# Patient Record
Sex: Female | Born: 1945
Health system: Southern US, Community
[De-identification: ages and names within clinical notes are randomized; demographics above are authoritative.]

## PROBLEM LIST (undated history)

## (undated) DIAGNOSIS — M4310 Spondylolisthesis, site unspecified: Secondary | ICD-10-CM

## (undated) DIAGNOSIS — T7840XA Allergy, unspecified, initial encounter: Secondary | ICD-10-CM

## (undated) DIAGNOSIS — Z8719 Personal history of other diseases of the digestive system: Secondary | ICD-10-CM

## (undated) DIAGNOSIS — G4730 Sleep apnea, unspecified: Secondary | ICD-10-CM

## (undated) DIAGNOSIS — R112 Nausea with vomiting, unspecified: Secondary | ICD-10-CM

## (undated) DIAGNOSIS — T4145XA Adverse effect of unspecified anesthetic, initial encounter: Secondary | ICD-10-CM

## (undated) DIAGNOSIS — Z889 Allergy status to unspecified drugs, medicaments and biological substances status: Secondary | ICD-10-CM

## (undated) DIAGNOSIS — I82409 Acute embolism and thrombosis of unspecified deep veins of unspecified lower extremity: Secondary | ICD-10-CM

## (undated) DIAGNOSIS — T8859XA Other complications of anesthesia, initial encounter: Secondary | ICD-10-CM

## (undated) DIAGNOSIS — M1990 Unspecified osteoarthritis, unspecified site: Secondary | ICD-10-CM

## (undated) DIAGNOSIS — K219 Gastro-esophageal reflux disease without esophagitis: Secondary | ICD-10-CM

## (undated) DIAGNOSIS — Z9889 Other specified postprocedural states: Secondary | ICD-10-CM

## (undated) DIAGNOSIS — E7800 Pure hypercholesterolemia, unspecified: Secondary | ICD-10-CM

## (undated) DIAGNOSIS — I2699 Other pulmonary embolism without acute cor pulmonale: Secondary | ICD-10-CM

## (undated) HISTORY — PX: HIATAL HERNIA REPAIR: SHX195

## (undated) HISTORY — PX: SPINE SURGERY: SHX786

## (undated) HISTORY — PX: BACK SURGERY: SHX140

## (undated) HISTORY — PX: TONSILLECTOMY: SUR1361

## (undated) HISTORY — DX: Allergy, unspecified, initial encounter: T78.40XA

## (undated) HISTORY — DX: Spondylolisthesis, site unspecified: M43.10

## (undated) HISTORY — DX: Acute embolism and thrombosis of unspecified deep veins of unspecified lower extremity: I82.409

---

## 1975-03-26 HISTORY — PX: TUBAL LIGATION: SHX77

## 1976-03-25 HISTORY — PX: HEMORRHOID SURGERY: SHX153

## 1998-03-25 HISTORY — PX: CHOLECYSTECTOMY: SHX55

## 2000-10-20 ENCOUNTER — Other Ambulatory Visit: Admission: RE | Admit: 2000-10-20 | Discharge: 2000-10-20 | Payer: Self-pay | Admitting: Obstetrics and Gynecology

## 2001-03-25 HISTORY — PX: CARPAL TUNNEL RELEASE: SHX101

## 2003-03-26 HISTORY — PX: EYE SURGERY: SHX253

## 2004-12-24 ENCOUNTER — Ambulatory Visit: Payer: Self-pay | Admitting: Gastroenterology

## 2005-01-25 ENCOUNTER — Ambulatory Visit: Payer: Self-pay | Admitting: Gastroenterology

## 2007-01-05 ENCOUNTER — Ambulatory Visit: Payer: Self-pay | Admitting: Gastroenterology

## 2007-03-26 HISTORY — PX: BLADDER SUSPENSION: SHX72

## 2009-03-25 DIAGNOSIS — I82409 Acute embolism and thrombosis of unspecified deep veins of unspecified lower extremity: Secondary | ICD-10-CM

## 2009-03-25 HISTORY — DX: Acute embolism and thrombosis of unspecified deep veins of unspecified lower extremity: I82.409

## 2010-01-23 ENCOUNTER — Ambulatory Visit: Payer: Self-pay | Admitting: Internal Medicine

## 2010-02-01 ENCOUNTER — Ambulatory Visit: Payer: Self-pay | Admitting: Internal Medicine

## 2010-02-01 ENCOUNTER — Inpatient Hospital Stay: Payer: Self-pay | Admitting: Internal Medicine

## 2010-02-01 ENCOUNTER — Ambulatory Visit: Payer: Self-pay | Admitting: Sports Medicine

## 2010-02-01 HISTORY — PX: OTHER SURGICAL HISTORY: SHX169

## 2010-02-08 ENCOUNTER — Ambulatory Visit: Payer: Self-pay | Admitting: Internal Medicine

## 2010-02-10 LAB — CEA: CEA: 0.4 ng/mL (ref 0.0–4.7)

## 2010-02-10 LAB — CA 125: CA 125: 8.5 U/mL (ref 0.0–34.0)

## 2010-02-22 ENCOUNTER — Ambulatory Visit: Payer: Self-pay | Admitting: Internal Medicine

## 2010-03-25 ENCOUNTER — Ambulatory Visit: Payer: Self-pay | Admitting: Internal Medicine

## 2010-04-13 ENCOUNTER — Ambulatory Visit: Payer: Self-pay | Admitting: Gastroenterology

## 2010-04-25 ENCOUNTER — Ambulatory Visit: Payer: Self-pay | Admitting: Internal Medicine

## 2010-06-11 ENCOUNTER — Ambulatory Visit: Payer: Self-pay | Admitting: Internal Medicine

## 2010-06-12 LAB — CANCER ANTIGEN 19-9: CA 19-9: 13 U/mL (ref 0–35)

## 2010-06-24 ENCOUNTER — Ambulatory Visit: Payer: Self-pay | Admitting: Internal Medicine

## 2010-08-08 ENCOUNTER — Ambulatory Visit (HOSPITAL_COMMUNITY)
Admission: RE | Admit: 2010-08-08 | Discharge: 2010-08-08 | Disposition: A | Discharge status: Home / Self Care | Payer: PRIVATE HEALTH INSURANCE | Source: Ambulatory Visit | Attending: Orthopedic Surgery | Admitting: Orthopedic Surgery

## 2010-08-08 ENCOUNTER — Encounter (HOSPITAL_COMMUNITY)
Admission: RE | Admit: 2010-08-08 | Discharge: 2010-08-08 | Disposition: A | Discharge status: Home / Self Care | Payer: PRIVATE HEALTH INSURANCE | Source: Ambulatory Visit | Attending: Orthopedic Surgery | Admitting: Orthopedic Surgery

## 2010-08-08 ENCOUNTER — Other Ambulatory Visit (HOSPITAL_COMMUNITY): Payer: Self-pay | Admitting: Orthopedic Surgery

## 2010-08-08 DIAGNOSIS — M51379 Other intervertebral disc degeneration, lumbosacral region without mention of lumbar back pain or lower extremity pain: Secondary | ICD-10-CM | POA: Insufficient documentation

## 2010-08-08 DIAGNOSIS — M4120 Other idiopathic scoliosis, site unspecified: Secondary | ICD-10-CM | POA: Insufficient documentation

## 2010-08-08 DIAGNOSIS — M47816 Spondylosis without myelopathy or radiculopathy, lumbar region: Secondary | ICD-10-CM

## 2010-08-08 DIAGNOSIS — M5137 Other intervertebral disc degeneration, lumbosacral region: Secondary | ICD-10-CM | POA: Insufficient documentation

## 2010-08-08 DIAGNOSIS — Z01818 Encounter for other preprocedural examination: Secondary | ICD-10-CM | POA: Insufficient documentation

## 2010-08-08 DIAGNOSIS — Z01812 Encounter for preprocedural laboratory examination: Secondary | ICD-10-CM | POA: Insufficient documentation

## 2010-08-08 DIAGNOSIS — I517 Cardiomegaly: Secondary | ICD-10-CM | POA: Insufficient documentation

## 2010-08-08 DIAGNOSIS — M79609 Pain in unspecified limb: Secondary | ICD-10-CM | POA: Insufficient documentation

## 2010-08-08 LAB — CBC
HCT: 41.2 % (ref 36.0–46.0)
Hemoglobin: 13.9 g/dL (ref 12.0–15.0)
MCH: 31.2 pg (ref 26.0–34.0)
MCHC: 33.7 g/dL (ref 30.0–36.0)
MCV: 92.4 fL (ref 78.0–100.0)
Platelets: 189 10*3/uL (ref 150–400)
RBC: 4.46 MIL/uL (ref 3.87–5.11)
RDW: 13.3 % (ref 11.5–15.5)
WBC: 7.5 10*3/uL (ref 4.0–10.5)

## 2010-08-08 LAB — BASIC METABOLIC PANEL
BUN: 15 mg/dL (ref 6–23)
CO2: 30 mEq/L (ref 19–32)
Calcium: 10.3 mg/dL (ref 8.4–10.5)
Chloride: 102 mEq/L (ref 96–112)
Creatinine, Ser: 0.85 mg/dL (ref 0.4–1.2)
GFR calc Af Amer: >60 mL/min (ref >60)
GFR calc non Af Amer: >60 mL/min (ref >60)
Glucose, Bld: 94 mg/dL (ref 70–99)
Potassium: 4.2 mEq/L (ref 3.5–5.1)
Sodium: 140 mEq/L (ref 135–145)

## 2010-08-08 LAB — SURGICAL PCR SCREEN
MRSA, PCR: NEGATIVE
Staphylococcus aureus: NEGATIVE

## 2010-08-08 LAB — PROTIME-INR
INR: 2.18 — ABNORMAL HIGH (ref 0.00–1.49)
Prothrombin Time: 24.4 seconds — ABNORMAL HIGH (ref 11.6–15.2)

## 2010-08-08 LAB — APTT: aPTT: 40 seconds — ABNORMAL HIGH (ref 24–37)

## 2010-08-15 ENCOUNTER — Inpatient Hospital Stay (HOSPITAL_COMMUNITY)
Admission: RE | Admit: 2010-08-15 | Discharge: 2010-08-18 | DRG: 460 | Disposition: A | Discharge status: Home Health | Payer: PRIVATE HEALTH INSURANCE | Source: Ambulatory Visit | Attending: Orthopedic Surgery | Admitting: Orthopedic Surgery

## 2010-08-15 ENCOUNTER — Inpatient Hospital Stay (HOSPITAL_COMMUNITY): Payer: PRIVATE HEALTH INSURANCE

## 2010-08-15 DIAGNOSIS — K219 Gastro-esophageal reflux disease without esophagitis: Secondary | ICD-10-CM | POA: Diagnosis present

## 2010-08-15 DIAGNOSIS — Z86718 Personal history of other venous thrombosis and embolism: Secondary | ICD-10-CM

## 2010-08-15 DIAGNOSIS — M5126 Other intervertebral disc displacement, lumbar region: Principal | ICD-10-CM | POA: Diagnosis present

## 2010-08-15 DIAGNOSIS — Z86711 Personal history of pulmonary embolism: Secondary | ICD-10-CM

## 2010-08-15 LAB — APTT: aPTT: 28 seconds (ref 24–37)

## 2010-08-15 LAB — TYPE AND SCREEN
ABO/RH(D): O POS
Antibody Screen: NEGATIVE

## 2010-08-15 LAB — ABO/RH: ABO/RH(D): O POS

## 2010-08-15 LAB — PROTIME-INR
INR: 1.07 (ref 0.00–1.49)
Prothrombin Time: 14.1 seconds (ref 11.6–15.2)

## 2010-08-16 ENCOUNTER — Inpatient Hospital Stay (HOSPITAL_COMMUNITY): Payer: PRIVATE HEALTH INSURANCE

## 2010-08-17 LAB — PROTIME-INR
INR: 1.09 (ref 0.00–1.49)
Prothrombin Time: 14.3 seconds (ref 11.6–15.2)

## 2010-08-18 LAB — PROTIME-INR
INR: 1.17 (ref 0.00–1.49)
Prothrombin Time: 15.1 seconds (ref 11.6–15.2)

## 2010-09-03 NOTE — Op Note (Signed)
NAME:  Elizabeth Crawford, Elizabeth Crawford              ACCOUNT NO.:  [id number]  MEDICAL RECORD NO.:  [id number]           PATIENT TYPE:  I  LOCATION:  5013                         FACILITY:  [facility]  PHYSICIAN:  Alvy Beal, MD    DATE OF BIRTH:  12/24/1945  DATE OF PROCEDURE: DATE OF DISCHARGE:                              OPERATIVE REPORT   PREOPERATIVE DIAGNOSIS:  Degenerative slip L4-5.  POSTOPERATIVE DIAGNOSIS:  Degenerative slip L4-5.  OPERATIVE PROCEDURE:  Initially planned for transforaminal lumbar interbody fusion L4-5.  However, after discussion in the holding area prior to the patient receiving any medication, I elected to change to an extreme lateral interbody fusion with posterior pedicle screw fixation. I did indicate to the patient that I thought the procedure to be done in shorter amount of time with less blood loss and have a greater chance of improved reduction and thereby indirect decompression of the nerve. Especially since she was having now bilateral leg pain  I wanted to ensure that I had a chance to do an indirect decompression of both exiting nerve roots.  I did re-discuss the risks of the XLIF which include infection, bleeding, nerve damage, death, stroke, paralysis, injury to the bowel, bleeding, blood clots, CSF leak, adjacent segment disease and nonunion.  After discussing this with her, she elected to proceed as I recommended with the XLIF with posterior pedicle screw fixation.  OPERATIVE PROCEDURE:  Extreme anterolateral interbody fusion L4-5 with a NuVasive PEEK interbody spacer, 18 x 10 lordotic spacer packed with Osteocel with unilateral pedicle screw fixation and posterior lateral arthrodesis.  COMPLICATIONS:  None.  BLOOD LOSS:  100 mL.  HISTORY:  This is a very pleasant 65 year old woman who was having severe debilitating back, buttock and initially left leg pain but now increasing right leg pain.  Attempts at conservative management had failed to  alleviate her symptoms and so we discussed surgical intervention.  After failing conservative management, we elected to proceed with surgery.  All appropriate risks, benefits, and alternatives were discussed.  OPERATIVE NOTE:  The patient was brought to the operating room, placed supine on the operating room table.  After successful induction of general anesthesia, endotracheal intubation, TEDs, SCDs and Foley were inserted.  Evoked motor potential needles were inserted into the lower extremities in the standard fashion.  The patient was then turned into the left lateral decubitus position left side up.  Axillary roll was placed and all bony prominences were well padded.  The patient was then secured to the table with tape.  We then brought the x-ray machine in, flexed the bed so that I had good visualization of the L4-5 disk space. Once this was done, I prepped and draped the flanks and back. Appropriate time-out was done to confirm patient procedure, affected extremity, and all other pertinent important data.  Once this was done, I made an incision approximately 1 fingerbreadth from the iliac wing, dissected down to the fascia of the external oblique.  The external oblique fascia was sharply incised.  This incision was approximately 2 inches in length.  I then made a second incision on the posterior lateral  aspect of the back and then dissected bluntly until I was at the fascia of the retroperitoneal space.  I popped through this fascia and then I was able to palpate the ventral surface of the retroperitoneal fascia.  I then swept into the retroperitoneal fascia with a finger and then swept my finger up to the external oblique.  I then bluntly dissected through the external oblique until my finger was now in the first incision.  I advanced trocar over my finger down to the iliopsoas. I then took a picture to make sure I was at the level of the disk space, connected the probe to the  neural stimulator and then advanced the probe with all stimulating through the iliopsoas and into the disk.  I then circumferentially tested to ensure that lumbar plexus was posterior to my trocar.  Once I confirmed this, I then sequentially dilated over this and then placed the guide pin down to hold the position.  I again confirmed satisfactory position in AP and lateral planes.  Once this was done, I placed a self-retaining retractor down, stimulated the posterior blade and again confirmed that the lumbar plexus was posterior to my blade.  I then placed a shim, locked into the disk space.  I then removed the trocars and the stabilizing pin and I could clearly visualize the lateral aspect of the L4-5 disk space.  I then properly positioned with retractors, placed with light and then incised the disk space with a 10 blade scalpel.  Using a combination of pituitary rongeurs, curettes and Kerrison rongeurs, I removed all of the disk material at the L4-5 level.  I had an excellent complete diskectomy, rasped the endplates until I had bleeding bone.  I then placed my angled Cobb elevator on the contralateral annulus and broke to the annulus at both the L5 and the L4 endplates.  At this point with the diskectomy complete and bleeding subchondral bone exposed, I then sequentially trialed and elected to put the 18 x 10 lordotic cage in place.  This was malleted into place and I had excellent reduction and placement and fixation with the hardware.  Once I had the cage in place, I then removed the retractors and made sure I had hemostasis in the psoas.  At this point the AP and lateral x-rays showed that I had satisfactory position of the hardware and at the grade 1 borderline grade 2 slip was completely reduced.  At this point I was very pleased with the reduction and stability of my graft and so I elected only to use unilateral pedicle screw fixation as this was all that I thought was  necessary.  At this point I identified the  lateral border of the left L4 and L5 pedicle and made an incision connecting.  I then dissected down to the deep fascia and then with my finger I was able to palpate the 3-4 and 4- 5 facet complexes as well as the L4 and L5 transverse processes.  Once I was able to palpate this with a finger, I placed my Jamshidi needle onto the transverse process of L4 and manually walked up to the lateral aspect of the facet.  I confirmed this positioning with x-ray and then advanced the trocar using x-ray as well as neuro monitoring through the pedicle.  I confirmed satisfactory position in both planes and then advanced the trocar into the vertebral body.  I repeated this at L5.  I then tapped over the guide pin and  then placed 40-mm Synthes Matrix MIS screws at the L4 and L5.  I had excellent purchase with the screws.  I then used a high-speed bur to decorticate the transverse process of L4 and L5 as well as the lateral aspect of the L4-5 facet complex.  I then placed a 40-mm rod and secured it down to the L4 and L5 screws and torqued them appropriately.  There was adequate amount of rod on both sides.  I then placed bone graft into the posterior lateral gutter, irrigated, closed in a layered fashion with interrupted 0 Vicryl suture, 2-0 Vicryl suture and 3-0 Monocryl.  I then irrigated the other 2 incisions, closed the fascia of the external oblique with interrupted #1 Vicryl sutures and superficial with 2-0 Vicryl sutures and 3-0 Monocryl for the skin.  The other incision was closed in a layered fashion with 0 Vicryl, 2-0 Vicryl, and 3-0 Monocryl.  At the end of the case, all needle and sponge counts were correct.  There was no adverse intraoperative events.  The patient was extubated, transferred to the PACU without incident.     Alvy Beal, MD     DDB/MEDQ  D:  08/15/2010  T:  08/16/2010  Job:  [id number]  Electronically Signed by Venita Lick MD on 09/03/2010 12:01:23 AM

## 2010-10-25 NOTE — Discharge Summary (Signed)
NAME:  Elizabeth Crawford, Elizabeth Crawford NO.:  [id number]  MEDICAL RECORD NO.:  [id number]  LOCATION:  5013                         FACILITY:  [facility]  PHYSICIAN:  Alvy Beal, MD    DATE OF BIRTH:  Mar 14, 1946  DATE OF ADMISSION:  08/15/2010 DATE OF DISCHARGE:  08/18/2010                              DISCHARGE SUMMARY   ADMITTING DIAGNOSIS:  Grade 1 borderline 2 spondylolisthesis at L4-5 with severe bilateral lower extremity pain.  DISCHARGE DIAGNOSES:  Status post XLIF L4-5, posterior spinal fusion instrumentation L4-5.  SURGEON:  Alvy Beal, MD  PROCEDURE:  Extreme anterolateral interbody fusion, L4-5 with an invasive PEEK interbody spacer, 18 x 10 lordotic spacer packed with a Osteocel with unilateral pedicle screw fixation and posterolateral arthrodesis.  COMPLICATIONS:  None.  ANESTHESIA:  General.  CONSULTS:  None.  BRIEF HISTORY:  Mackenzi is a 65 year old retired woman who had beenhaving significant progressive back and now increasing bilateral leg pain since September 2011.  There was no known injury, trauma, or specific event.  She has tried and unfortunately failed injection therapy, physical therapy, activity modification, and oral medications. Because of the continued progression of her symptoms, she elected to proceed with surgery.  MRI from July 02, 2010, demonstrates a grade 1 anterolisthesis of L4 on L5 related to severe facet arthrosis.  Severe central canal and lateral recess stenosis.  Moderate foraminal stenosis.  HOSPITAL COURSE:  On Aug 15, 2010, the patient was admitted to [facility], underwent the above procedure without complications.  The patient's condition following surgery was stable.  She was transferred to the PACU and subsequently to the orthopedic floor in good condition.  On postop day #1, the patient was doing well.  There was no complaints of leg pain.  Vital signs were stable.  She was afebrile.  There was  no shortness of breath or chest pain.  The abdomen was soft and nontender. Neurovascular status was intact distally.  On postop day #2, the patient had progressed on schedule without difficulty with inpatient PT and OT.  Her clinical exam was unchanged. She was tolerating her diet.  She is moving her bowels without difficulty.  She is tolerating her oral pain medication.  X-rays of the lumbar spine considered satisfactory.  PLAN:  On postop day #2, was to discharge to home, Saturday.  On postop day #3, the patient will be discharged to home with home health assistance.  She will follow up in our office in 2 weeks for wound check and evaluation.  DISCHARGE INSTRUCTIONS:  Given to the patient at time of discharge.  She understands.  DISCHARGE MEDICATIONS: 1. Calcium carbonate/vitamin D over the counter 1 p.o. daily. 2. Loratadine 10 mg 1 p.o. daily. 3. Omeprazole 40 mg 1 p.o. daily  At this point, we will discontinue the tramadol 50 mg and the warfarin 4 mg.  All the patient's questions were encouraged, addressed, and answered. At the time of discharge, the patient was stable and in good condition. She will follow up with [place] in the office in 2 weeks.    ______________________________ Norval Gable, PA   ______________________________ Alvy Beal, MD    DK/MEDQ  D:  10/18/2010  T:  10/19/2010  Job:  [id number]  Electronically Signed by Norval Gable PA on 10/22/2010 08:27:23 AM Electronically Signed by Venita Lick MD on 10/25/2010 11:27:57 AM

## 2010-11-27 ENCOUNTER — Ambulatory Visit: Payer: Self-pay | Admitting: Vascular Surgery

## 2011-02-18 ENCOUNTER — Encounter: Payer: Self-pay | Admitting: Vascular Surgery

## 2011-02-25 ENCOUNTER — Encounter: Payer: Self-pay | Admitting: Vascular Surgery

## 2011-02-26 ENCOUNTER — Ambulatory Visit (INDEPENDENT_AMBULATORY_CARE_PROVIDER_SITE_OTHER): Payer: PRIVATE HEALTH INSURANCE | Admitting: Vascular Surgery

## 2011-02-26 ENCOUNTER — Encounter: Payer: Self-pay | Admitting: Vascular Surgery

## 2011-02-26 VITALS — BP 124/82 | HR 84 | Resp 16 | Ht 65.0 in | Wt 195.0 lb

## 2011-02-26 DIAGNOSIS — Z86718 Personal history of other venous thrombosis and embolism: Secondary | ICD-10-CM

## 2011-02-26 DIAGNOSIS — Z86711 Personal history of pulmonary embolism: Secondary | ICD-10-CM

## 2011-02-26 NOTE — Progress Notes (Signed)
The patient presents today for second opinion regarding her inferior vena caval filter. She is a very pleasant 65 year old female who had an episode of DVT and pulmonary embolus in November of 2011. She had an extensive workup and no etiology for her thrombus. She did not have any evidence of hypercoagulability. She does report that she was on hormone replacement at the time of this and stopped this. She is been maintained on Coumadin therapy since the event. There was decision to place a vena cava filter at the time of her initial DVT and pulmonary embolus. She recently underwent back surgery with some question of angulation of her filter. She was seen by her vascular surgeon who suggested attempted retrieval of this. This was undertaken in September of this year. I have her vena caval venogram for review and also reviewed the films from her spine surgery. This does show angulation of the filter and the venogram shows that the stress are out of the cava towards the midline projecting to the left. His had no subsequent thrombus and does not have any history of lower surety arterial insufficiency.   Past Medical History  Diagnosis Date  . Spondylolisthesis   . Allergy   . DVT (deep venous thrombosis)     History  Substance Use Topics  . Smoking status: Former Smoker    Types: Cigarettes    Quit date: 02/26/1976  . Smokeless tobacco: Not on file  . Alcohol Use: No     occasionally    Family History  Problem Relation Age of Onset  . COPD Mother   . Heart disease Mother   . Aortic aneurysm Mother   . Heart disease Father     Allergies  Allergen Reactions  . Sulfa Antibiotics     Current outpatient prescriptions:DiphenhydrAMINE HCl (ALLERGY MEDICATION PO), Take by mouth.  , Disp: , Rfl: ;  Omeprazole POWD, by Does not apply route.  , Disp: , Rfl: ;  Warfarin Sodium (COUMADIN PO), Take by mouth.  , Disp: , Rfl: ;  cetirizine (ZYRTEC) 10 MG tablet, Take 10 mg by mouth daily.  , Disp: , Rfl:  ;  traMADol (ULTRAM) 50 MG tablet, Take 50 mg by mouth every 8 (eight) hours as needed. Maximum dose= 8 tablets per day , Disp: , Rfl:   BP 124/82  Pulse 84  Resp 16  Ht 5\' 5"  (1.651 m)  Wt 195 lb (88.451 kg)  BMI 32.45 kg/m2  SpO2 99%  Body mass index is 32.45 kg/(m^2).       Review of systems: Some swelling in her legs and shortness of breath with exertion, occasional rashes, otherwise negative.  Physical exam: Well-developed white female no acute distress. HEENT normal. Carotid arteries a bruits bilaterally. Radial femoral and dorsalis pedis pulses 2+ bilaterally mild bilateral swelling of her knees distally. Abdomen soft nontender no masses noted. Heart regular rate and rhythm. Neurologically grossly intact. Skin without ulcers or rashes.  Impression and plan: Pulmonary embolus following deep venous thrombosis in November of 2012 with subsequent vena cava filter placement. All of films show penetration of the medial left sided struts of the filter outside the cava. A very long discussion with the patient and her husband present. Be quite difficult to retrieve this since it didn't in place for nearly one year even if it had not penetrated the cava. I explained to be quite unlike her to have symptoms related to this. Did explain potential bowel and aortic penetration. I explained that she  did have aortic penetration this would probably be asymptomatic and may have embolus distally. Certainly would not recommend operative removal of her caval filter.  Regarding her anticoagulation, she does not have any indication for long-term anticoagulation I can find other than possibly the existence of a vena cava filter. I explained my opinion that this would not be indication for long-term anticoagulation would feel comfortable stopping her Coumadin and beginning daily aspirin therapy. She will discuss this further with her medical doctor. We will see her again on an as-needed basis.

## 2011-07-02 ENCOUNTER — Ambulatory Visit: Payer: Self-pay | Admitting: Family Medicine

## 2012-03-25 HISTORY — PX: COLONOSCOPY W/ POLYPECTOMY: SHX1380

## 2012-05-16 IMAGING — US US EXTREM LOW VENOUS*L*
1 series · 17 of 24 positions shown · non-contrast
Comparison: none

REASON FOR EXAM: CR [id number] Eval DVT pain swelling hist of DVT
COMMENTS:

[Series 1: us extrem low venous*left* · 17 of 31 slices shown]
[im 1/31]
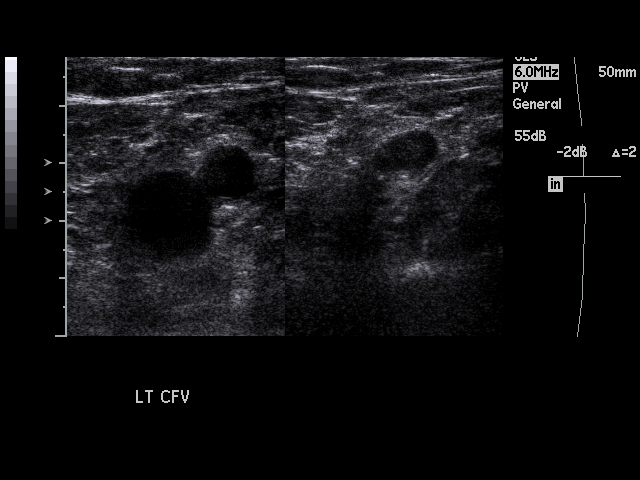
[im 3/31]
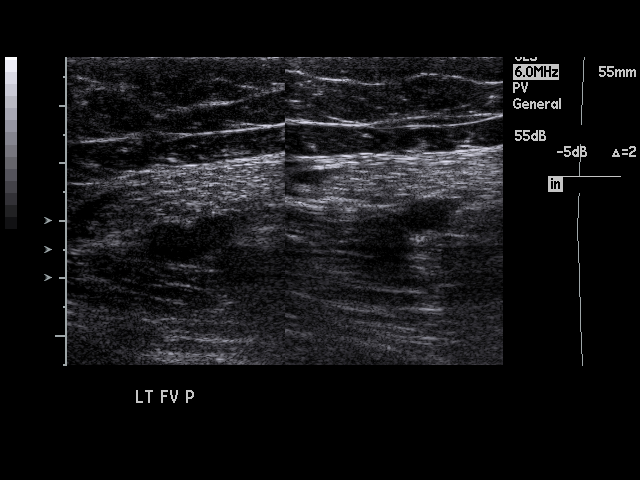
[im 4/31]
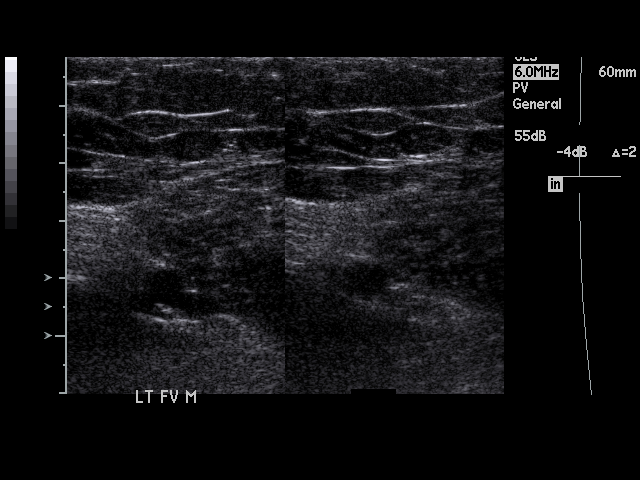
[im 6/31]
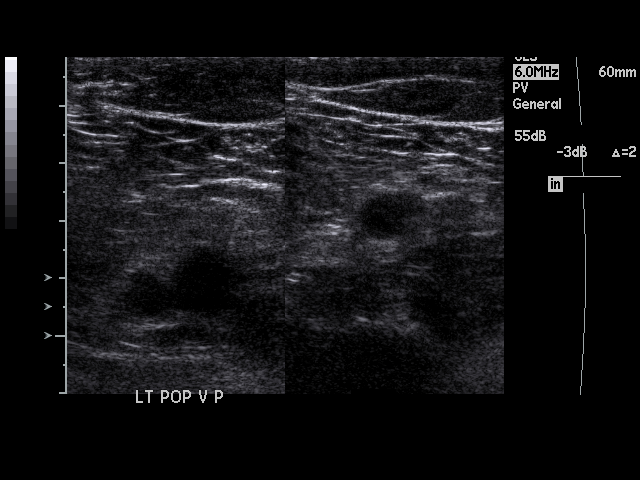
[im 8/31]
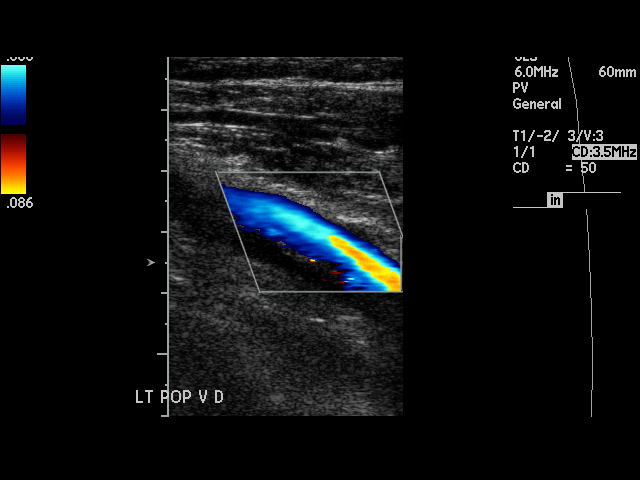
[im 10/31]
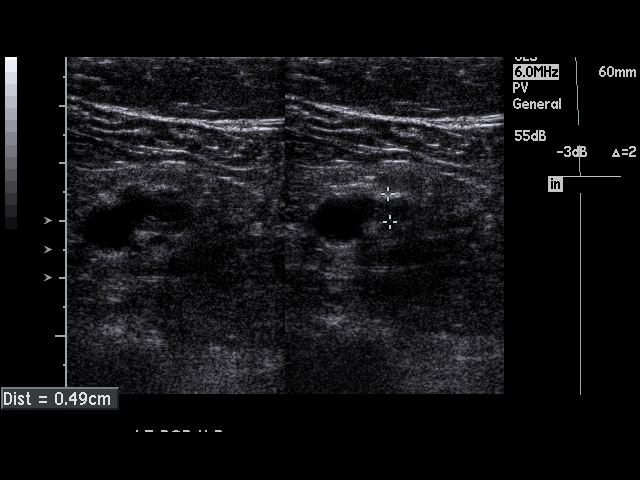
[im 12/31]
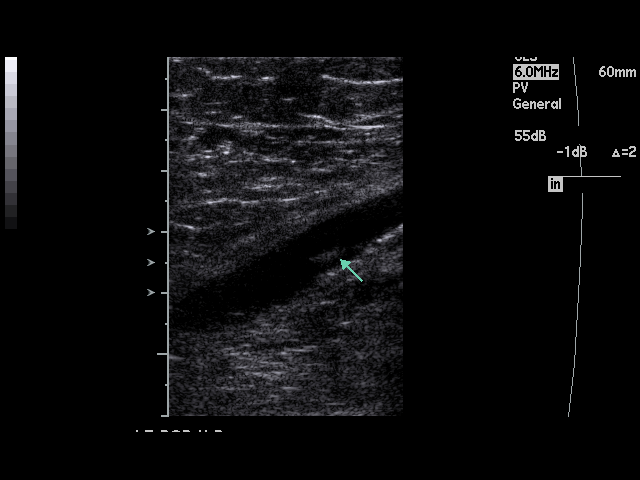
[im 14/31]
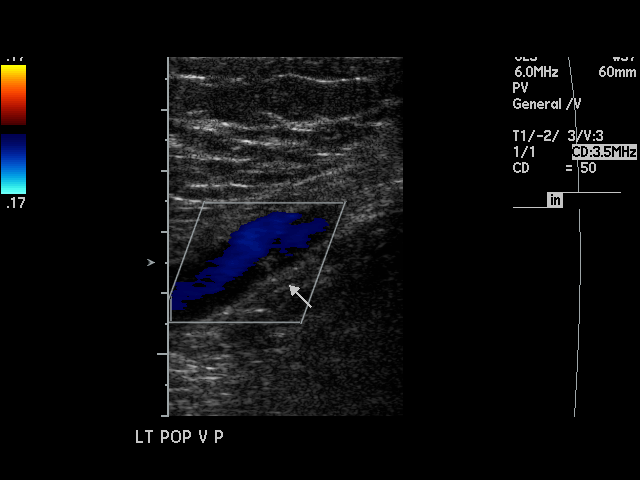
[im 16/31]
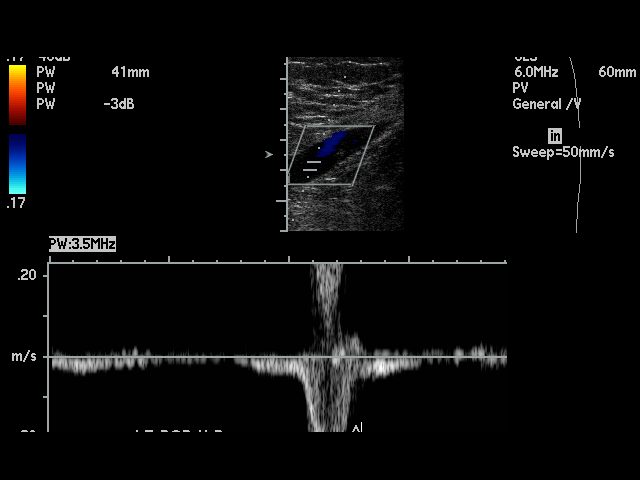
[im 17/31]
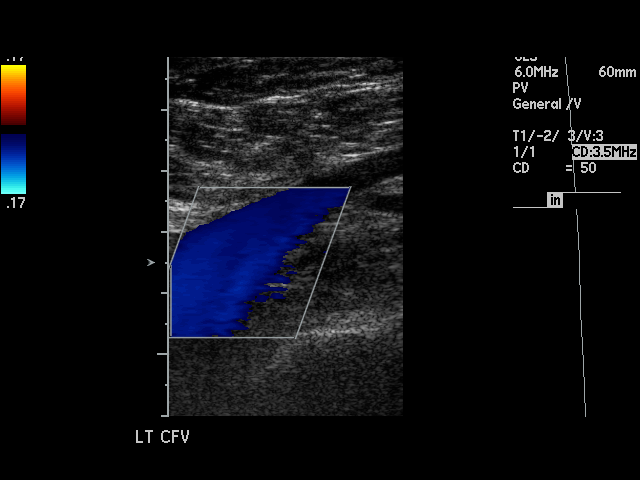
[im 19/31]
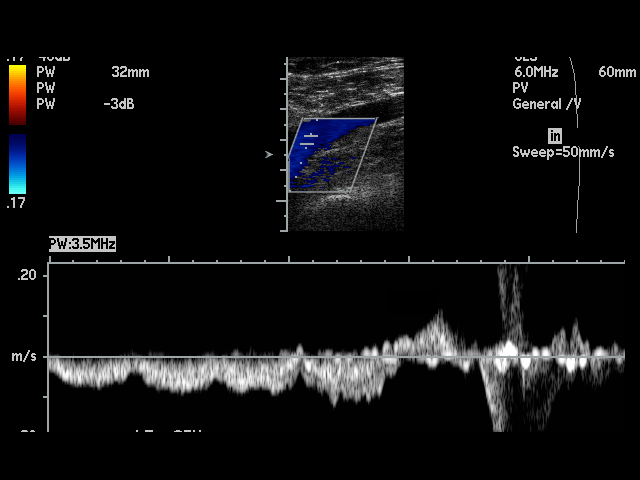
[im 21/31]
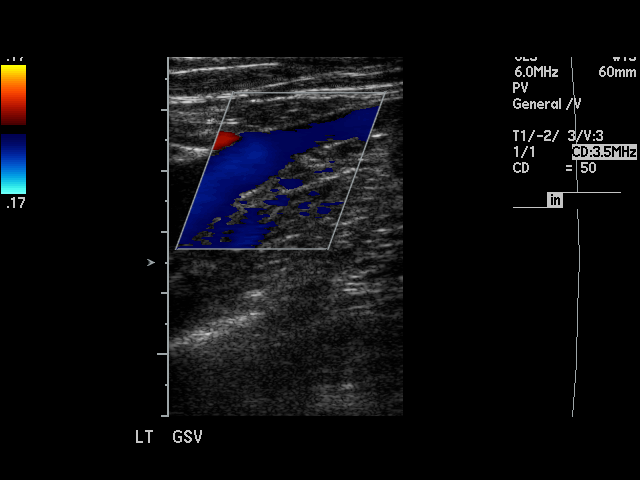
[im 23/31]
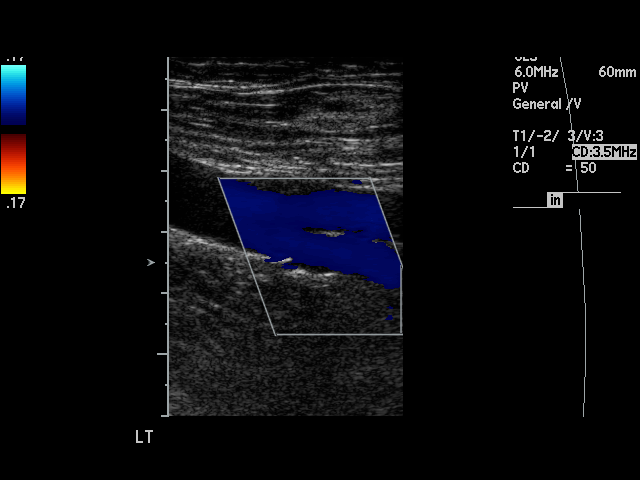
[im 25/31]
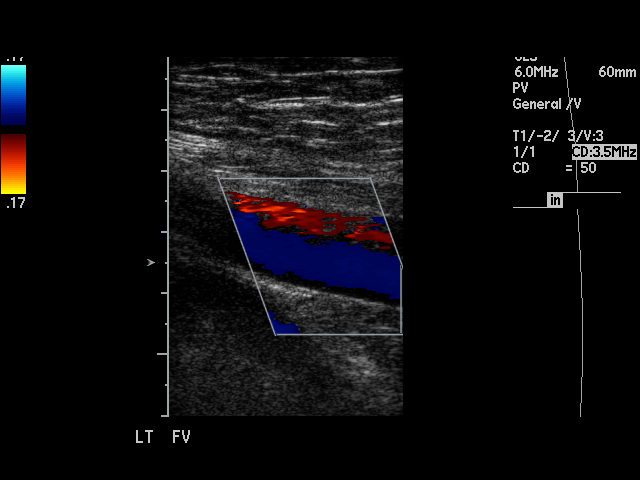
[im 27/31]
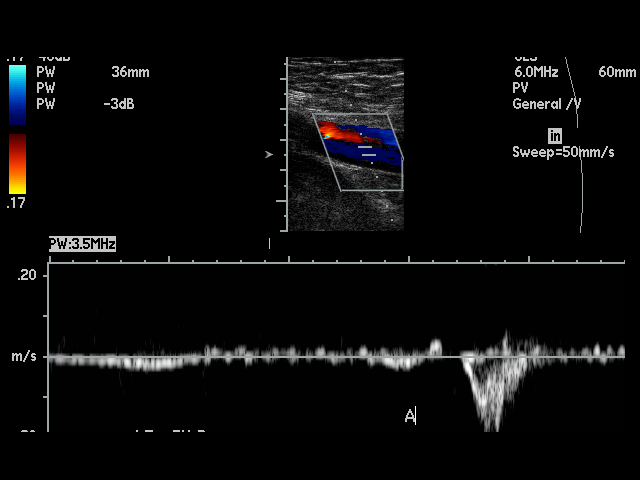
[im 28/31]
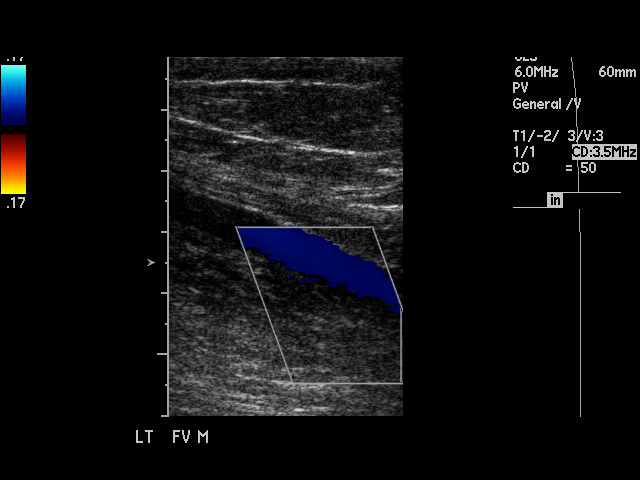
[im 31/31]
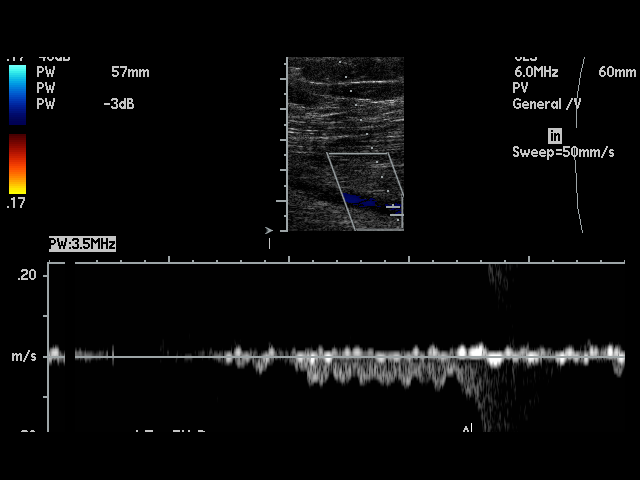

[17 of 24 positions shown; findings below may reference images not displayed]

PROCEDURE:     US  - US DOPPLER LOW EXTR LEFT  - July 02, 2011  [DATE]

RESULT:     The phasic, augmentation and Valsalva flow waveforms are normal
in appearance. There is observed normal compressibility of the common
femoral and superficial femoral. There is partial loss of compressibility of
the distal portion of the popliteal where there is observed a small
nonocclusive thrombus. Note is made this is the region of a prior deep vein
thrombosis of the popliteal that was observed on the exam of February 02, 2010. The finding observed at this time is minimal and could be chronic, but
the possibility of a minimal recurrent acute thrombus involving the
popliteal cannot be excluded on the basis of this exam.
IMPRESSION: 1.     There is a focal nonocclusive thrombus involving the left popliteal.
It is indeterminate as to whether the finding is chronic or acute but could
be acute.

## 2012-05-20 ENCOUNTER — Ambulatory Visit: Payer: Self-pay | Admitting: Gastroenterology

## 2012-05-22 LAB — PATHOLOGY REPORT

## 2013-04-30 ENCOUNTER — Emergency Department: Payer: Self-pay | Admitting: Emergency Medicine

## 2014-05-27 DIAGNOSIS — R32 Unspecified urinary incontinence: Secondary | ICD-10-CM | POA: Insufficient documentation

## 2015-04-21 ENCOUNTER — Ambulatory Visit: Admission: RE | Admit: 2015-04-21 | Payer: Medicare Other | Source: Ambulatory Visit | Admitting: Gastroenterology

## 2015-04-21 ENCOUNTER — Encounter: Admission: RE | Payer: Self-pay | Source: Ambulatory Visit

## 2015-04-21 SURGERY — COLONOSCOPY
Anesthesia: General

## 2015-10-13 ENCOUNTER — Other Ambulatory Visit: Payer: Self-pay | Admitting: Orthopedic Surgery

## 2015-10-13 DIAGNOSIS — M5441 Lumbago with sciatica, right side: Secondary | ICD-10-CM

## 2015-10-17 ENCOUNTER — Ambulatory Visit
Admission: RE | Admit: 2015-10-17 | Discharge: 2015-10-17 | Disposition: A | Discharge status: Home / Self Care | Payer: Medicare Other | Source: Ambulatory Visit | Attending: Orthopedic Surgery | Admitting: Orthopedic Surgery

## 2015-10-17 DIAGNOSIS — M5441 Lumbago with sciatica, right side: Secondary | ICD-10-CM

## 2015-11-02 ENCOUNTER — Ambulatory Visit: Payer: Self-pay | Admitting: Physician Assistant

## 2015-11-07 ENCOUNTER — Encounter (HOSPITAL_COMMUNITY): Payer: Self-pay

## 2015-11-07 ENCOUNTER — Encounter (HOSPITAL_COMMUNITY)
Admission: RE | Admit: 2015-11-07 | Discharge: 2015-11-07 | Disposition: A | Discharge status: Home / Self Care | Payer: Medicare Other | Source: Ambulatory Visit | Attending: Orthopedic Surgery | Admitting: Orthopedic Surgery

## 2015-11-07 HISTORY — DX: Allergy status to unspecified drugs, medicaments and biological substances: Z88.9

## 2015-11-07 HISTORY — DX: Unspecified osteoarthritis, unspecified site: M19.90

## 2015-11-07 HISTORY — DX: Other specified postprocedural states: Z98.890

## 2015-11-07 HISTORY — DX: Adverse effect of unspecified anesthetic, initial encounter: T41.45XA

## 2015-11-07 HISTORY — DX: Gastro-esophageal reflux disease without esophagitis: K21.9

## 2015-11-07 HISTORY — DX: Other specified postprocedural states: R11.2

## 2015-11-07 HISTORY — DX: Other pulmonary embolism without acute cor pulmonale: I26.99

## 2015-11-07 HISTORY — DX: Other complications of anesthesia, initial encounter: T88.59XA

## 2015-11-07 LAB — SURGICAL PCR SCREEN
MRSA, PCR: NEGATIVE
Staphylococcus aureus: NEGATIVE

## 2015-11-07 NOTE — Pre-Procedure Instructions (Addendum)
    MEA CROCKER   Your procedure is scheduled on Thursday, August 17.  Report to [facility] Admitting at 7:10 A.M.              Your surgery or procedure is scheduled for 9:10 AM   Call this number if you have problems the morning of surgery. [phone]               For any other questions, please call [phone], Monday - Friday 8 AM - 4 PM.   Remember:  Do not eat food or drink liquids after midnight Wednesday, August 16.  Take these medicines the morning of surgery with A SIP OF WATER : omeprazole (PRILOSEC), loratadine (CLARITIN).              Stop taking Aspirin, Vitamins herbal medications.  Do not take any NSAIDS ie:  Ibuprofen, Advil, Naproxen.    Do not wear jewelry, make-up or nail polish.  Do not wear lotions, powders, or perfumes.  Do not shave 48 hours prior to surgery.  Do not bring valuables to the hospital.  [facility] is not responsible for any belongings or valuables.  Contacts, dentures or bridgework may not be worn into surgery.  Leave your suitcase in the car.  After surgery it may be brought to your room.  For patients admitted to the hospital, discharge time will be determined by your treatment team.  Patients discharged the day of surgery will not be allowed to drive home.   Please read over the following fact sheets that you were given:  [facility]- Preparing For Surgery and Patient Instructions for Mupirocin Application

## 2015-11-07 NOTE — Progress Notes (Signed)
The hospital does not  carry  the size TED  hose that Mrs Elizabeth Crawford needs. I notified Dr Rolena Infante office. PA called for clarification and said that she will notify Dr Rolena Infante.

## 2015-11-07 NOTE — Progress Notes (Signed)
Mrs Elsberry reports that she was seen by her PCP, Dr Chrystine Oiler at [facility] in [place].  Labs were drawn 10/26/15 and EKG was done - per patient.  I faxed a request to [facility] clinic for a copy of EKG.

## 2015-11-08 MED ORDER — CEFAZOLIN SODIUM-DEXTROSE 2-4 GM/100ML-% IV SOLN
2.0000 g | INTRAVENOUS | Status: AC
  Administered 2015-11-09: 2 g via INTRAVENOUS
  Filled 2015-11-08: qty 100

## 2015-11-09 ENCOUNTER — Inpatient Hospital Stay (HOSPITAL_COMMUNITY)
Admission: RE | Admit: 2015-11-09 | Discharge: 2015-11-11 | DRG: 497 | Disposition: A | Discharge status: Home / Self Care | Payer: Medicare Other | Source: Ambulatory Visit | Attending: Orthopedic Surgery | Admitting: Orthopedic Surgery

## 2015-11-09 ENCOUNTER — Ambulatory Visit (HOSPITAL_COMMUNITY): Payer: Medicare Other

## 2015-11-09 ENCOUNTER — Encounter (HOSPITAL_COMMUNITY)
Admission: RE | Disposition: A | Discharge status: Home / Self Care | Payer: Self-pay | Source: Ambulatory Visit | Attending: Orthopedic Surgery

## 2015-11-09 ENCOUNTER — Ambulatory Visit (HOSPITAL_COMMUNITY): Payer: Medicare Other | Admitting: Certified Registered Nurse Anesthetist

## 2015-11-09 ENCOUNTER — Encounter (HOSPITAL_COMMUNITY): Payer: Self-pay | Admitting: Certified Registered Nurse Anesthetist

## 2015-11-09 DIAGNOSIS — M1811 Unilateral primary osteoarthritis of first carpometacarpal joint, right hand: Secondary | ICD-10-CM | POA: Diagnosis present

## 2015-11-09 DIAGNOSIS — Z8249 Family history of ischemic heart disease and other diseases of the circulatory system: Secondary | ICD-10-CM | POA: Diagnosis not present

## 2015-11-09 DIAGNOSIS — M549 Dorsalgia, unspecified: Secondary | ICD-10-CM | POA: Diagnosis present

## 2015-11-09 DIAGNOSIS — Z981 Arthrodesis status: Secondary | ICD-10-CM | POA: Diagnosis not present

## 2015-11-09 DIAGNOSIS — K219 Gastro-esophageal reflux disease without esophagitis: Secondary | ICD-10-CM | POA: Diagnosis present

## 2015-11-09 DIAGNOSIS — Z86718 Personal history of other venous thrombosis and embolism: Secondary | ICD-10-CM

## 2015-11-09 DIAGNOSIS — M5441 Lumbago with sciatica, right side: Secondary | ICD-10-CM | POA: Diagnosis present

## 2015-11-09 DIAGNOSIS — Z86711 Personal history of pulmonary embolism: Secondary | ICD-10-CM | POA: Diagnosis not present

## 2015-11-09 DIAGNOSIS — Z888 Allergy status to other drugs, medicaments and biological substances status: Secondary | ICD-10-CM | POA: Diagnosis not present

## 2015-11-09 DIAGNOSIS — M19042 Primary osteoarthritis, left hand: Secondary | ICD-10-CM | POA: Diagnosis present

## 2015-11-09 DIAGNOSIS — M533 Sacrococcygeal disorders, not elsewhere classified: Secondary | ICD-10-CM | POA: Diagnosis present

## 2015-11-09 DIAGNOSIS — M4806 Spinal stenosis, lumbar region: Secondary | ICD-10-CM | POA: Diagnosis present

## 2015-11-09 DIAGNOSIS — Z825 Family history of asthma and other chronic lower respiratory diseases: Secondary | ICD-10-CM

## 2015-11-09 DIAGNOSIS — Z419 Encounter for procedure for purposes other than remedying health state, unspecified: Secondary | ICD-10-CM

## 2015-11-09 DIAGNOSIS — Z87891 Personal history of nicotine dependence: Secondary | ICD-10-CM | POA: Diagnosis not present

## 2015-11-09 HISTORY — PX: DECOMPRESSIVE LUMBAR LAMINECTOMY LEVEL 2: SHX5792

## 2015-11-09 HISTORY — PX: HARDWARE REMOVAL: SHX979

## 2015-11-09 SURGERY — DECOMPRESSIVE LUMBAR LAMINECTOMY LEVEL 2
Anesthesia: General

## 2015-11-09 MED ORDER — DEXAMETHASONE SODIUM PHOSPHATE 10 MG/ML IJ SOLN
INTRAMUSCULAR | Status: DC | PRN
  Administered 2015-11-09: 10 mg via INTRAVENOUS

## 2015-11-09 MED ORDER — PREGABALIN 25 MG PO CAPS
50.0000 mg | ORAL_CAPSULE | Freq: Every day | ORAL | Status: DC
Start: 2015-11-09 — End: 2015-11-11
  Administered 2015-11-09 – 2015-11-11 (×3): 50 mg via ORAL
  Filled 2015-11-09 (×3): qty 2

## 2015-11-09 MED ORDER — DEXAMETHASONE SODIUM PHOSPHATE 10 MG/ML IJ SOLN
INTRAMUSCULAR | Status: AC
  Filled 2015-11-09: qty 1

## 2015-11-09 MED ORDER — METHOCARBAMOL 500 MG PO TABS
ORAL_TABLET | ORAL | Status: AC
  Filled 2015-11-09: qty 1

## 2015-11-09 MED ORDER — LUBIPROSTONE 24 MCG PO CAPS
24.0000 ug | ORAL_CAPSULE | Freq: Two times a day (BID) | ORAL | Status: DC
  Administered 2015-11-10 – 2015-11-11 (×3): 24 ug via ORAL
  Filled 2015-11-09 (×4): qty 1

## 2015-11-09 MED ORDER — FENTANYL CITRATE (PF) 100 MCG/2ML IJ SOLN
INTRAMUSCULAR | Status: AC
  Filled 2015-11-09: qty 2

## 2015-11-09 MED ORDER — SODIUM CHLORIDE 0.9% FLUSH
3.0000 mL | Freq: Two times a day (BID) | INTRAVENOUS | Status: DC
  Administered 2015-11-09 – 2015-11-11 (×4): 3 mL via INTRAVENOUS

## 2015-11-09 MED ORDER — HEMOSTATIC AGENTS (NO CHARGE) OPTIME
TOPICAL | Status: DC | PRN
  Administered 2015-11-09 (×3): 1 via TOPICAL

## 2015-11-09 MED ORDER — ARTIFICIAL TEARS OP OINT
TOPICAL_OINTMENT | OPHTHALMIC | Status: AC
  Filled 2015-11-09: qty 3.5

## 2015-11-09 MED ORDER — DIPHENHYDRAMINE HCL 50 MG/ML IJ SOLN
INTRAMUSCULAR | Status: DC | PRN
  Administered 2015-11-09: 25 mg via INTRAVENOUS

## 2015-11-09 MED ORDER — ONDANSETRON HCL 4 MG/2ML IJ SOLN
INTRAMUSCULAR | Status: AC
  Filled 2015-11-09: qty 2

## 2015-11-09 MED ORDER — CEFAZOLIN IN D5W 1 GM/50ML IV SOLN
1.0000 g | Freq: Three times a day (TID) | INTRAVENOUS | Status: AC
  Administered 2015-11-09 – 2015-11-10 (×2): 1 g via INTRAVENOUS
  Filled 2015-11-09 (×2): qty 50

## 2015-11-09 MED ORDER — PANTOPRAZOLE SODIUM 40 MG PO TBEC
40.0000 mg | DELAYED_RELEASE_TABLET | Freq: Every day | ORAL | Status: DC
  Administered 2015-11-10 – 2015-11-11 (×2): 40 mg via ORAL
  Filled 2015-11-09 (×4): qty 1

## 2015-11-09 MED ORDER — GLYCOPYRROLATE 0.2 MG/ML IJ SOLN
INTRAMUSCULAR | Status: DC | PRN
  Administered 2015-11-09 (×2): 0.2 mg via INTRAVENOUS

## 2015-11-09 MED ORDER — SCOPOLAMINE 1 MG/3DAYS TD PT72
MEDICATED_PATCH | TRANSDERMAL | Status: AC
  Filled 2015-11-09: qty 1

## 2015-11-09 MED ORDER — ONDANSETRON HCL 4 MG/2ML IJ SOLN
INTRAMUSCULAR | Status: DC | PRN
  Administered 2015-11-09: 4 mg via INTRAVENOUS

## 2015-11-09 MED ORDER — SODIUM CHLORIDE 0.9% FLUSH: 3.0000 mL | INTRAVENOUS | Status: DC | PRN

## 2015-11-09 MED ORDER — THROMBIN 20000 UNITS EX SOLR
CUTANEOUS | Status: AC
  Filled 2015-11-09: qty 40000

## 2015-11-09 MED ORDER — METHOCARBAMOL 500 MG PO TABS: 500.0000 mg | ORAL_TABLET | Freq: Three times a day (TID) | ORAL | 0 refills | Status: DC | PRN

## 2015-11-09 MED ORDER — PROPOFOL 500 MG/50ML IV EMUL
INTRAVENOUS | Status: DC | PRN
  Administered 2015-11-09: 25 ug/kg/min via INTRAVENOUS

## 2015-11-09 MED ORDER — PROMETHAZINE HCL 25 MG/ML IJ SOLN
6.2500 mg | INTRAMUSCULAR | Status: DC | PRN
Start: 2015-11-09 — End: 2015-11-09

## 2015-11-09 MED ORDER — LIDOCAINE 2% (20 MG/ML) 5 ML SYRINGE
INTRAMUSCULAR | Status: AC
  Filled 2015-11-09: qty 5

## 2015-11-09 MED ORDER — SUGAMMADEX SODIUM 200 MG/2ML IV SOLN
INTRAVENOUS | Status: AC
  Filled 2015-11-09: qty 2

## 2015-11-09 MED ORDER — PROPOFOL 1000 MG/100ML IV EMUL
INTRAVENOUS | Status: AC
  Filled 2015-11-09: qty 200

## 2015-11-09 MED ORDER — GLYCOPYRROLATE 0.2 MG/ML IV SOSY
PREFILLED_SYRINGE | INTRAVENOUS | Status: AC
  Filled 2015-11-09: qty 3

## 2015-11-09 MED ORDER — MIDAZOLAM HCL 2 MG/2ML IJ SOLN
INTRAMUSCULAR | Status: AC
  Filled 2015-11-09: qty 2

## 2015-11-09 MED ORDER — LACTATED RINGERS IV SOLN
INTRAVENOUS | Status: DC
  Administered 2015-11-09 (×3): via INTRAVENOUS

## 2015-11-09 MED ORDER — ROCURONIUM BROMIDE 100 MG/10ML IV SOLN
INTRAVENOUS | Status: DC | PRN
  Administered 2015-11-09: 60 mg via INTRAVENOUS

## 2015-11-09 MED ORDER — METHOCARBAMOL 1000 MG/10ML IJ SOLN
500.0000 mg | Freq: Four times a day (QID) | INTRAVENOUS | Status: DC | PRN
  Administered 2015-11-09: 500 mg via INTRAVENOUS
  Filled 2015-11-09 (×3): qty 5

## 2015-11-09 MED ORDER — SUGAMMADEX SODIUM 200 MG/2ML IV SOLN
INTRAVENOUS | Status: DC | PRN
  Administered 2015-11-09: 200 mg via INTRAVENOUS

## 2015-11-09 MED ORDER — ONDANSETRON HCL 4 MG PO TABS: 4.0000 mg | ORAL_TABLET | Freq: Three times a day (TID) | ORAL | 0 refills | Status: DC | PRN

## 2015-11-09 MED ORDER — BUPIVACAINE-EPINEPHRINE (PF) 0.5% -1:200000 IJ SOLN
INTRAMUSCULAR | Status: AC
  Filled 2015-11-09: qty 30

## 2015-11-09 MED ORDER — METHOCARBAMOL 500 MG PO TABS
500.0000 mg | ORAL_TABLET | Freq: Four times a day (QID) | ORAL | Status: DC | PRN
  Administered 2015-11-10 – 2015-11-11 (×4): 500 mg via ORAL
  Filled 2015-11-09 (×5): qty 1

## 2015-11-09 MED ORDER — DIPHENHYDRAMINE HCL 50 MG/ML IJ SOLN
INTRAMUSCULAR | Status: AC
  Filled 2015-11-09: qty 1

## 2015-11-09 MED ORDER — PROPOFOL 10 MG/ML IV BOLUS
INTRAVENOUS | Status: DC | PRN
Start: 2015-11-09 — End: 2015-11-09
  Administered 2015-11-09: 60 mg via INTRAVENOUS
  Administered 2015-11-09: 140 mg via INTRAVENOUS

## 2015-11-09 MED ORDER — PHENYLEPHRINE HCL 10 MG/ML IJ SOLN
INTRAVENOUS | Status: DC | PRN
  Administered 2015-11-09: 50 ug/min via INTRAVENOUS

## 2015-11-09 MED ORDER — METOPROLOL TARTRATE 5 MG/5ML IV SOLN
INTRAVENOUS | Status: AC
  Filled 2015-11-09: qty 5

## 2015-11-09 MED ORDER — OXYCODONE HCL 5 MG PO TABS
ORAL_TABLET | ORAL | Status: AC
  Filled 2015-11-09: qty 1

## 2015-11-09 MED ORDER — GELATIN ABSORBABLE 100 EX MISC
CUTANEOUS | Status: DC | PRN
  Administered 2015-11-09: 10:00:00 via TOPICAL

## 2015-11-09 MED ORDER — OXYCODONE-ACETAMINOPHEN 10-325 MG PO TABS: 1.0000 | ORAL_TABLET | ORAL | 0 refills | Status: DC | PRN

## 2015-11-09 MED ORDER — MENTHOL 3 MG MT LOZG: 1.0000 | LOZENGE | OROMUCOSAL | Status: DC | PRN

## 2015-11-09 MED ORDER — METOCLOPRAMIDE HCL 5 MG/ML IJ SOLN
10.0000 mg | INTRAMUSCULAR | Status: AC
  Administered 2015-11-09: 10 mg via INTRAVENOUS
  Filled 2015-11-09: qty 2

## 2015-11-09 MED ORDER — SCOPOLAMINE 1 MG/3DAYS TD PT72
MEDICATED_PATCH | TRANSDERMAL | Status: DC | PRN
  Administered 2015-11-09: 1 via TRANSDERMAL

## 2015-11-09 MED ORDER — SODIUM CHLORIDE 0.9 % IV SOLN: 250.0000 mL | INTRAVENOUS | Status: DC

## 2015-11-09 MED ORDER — MEPERIDINE HCL 25 MG/ML IJ SOLN: 6.2500 mg | INTRAMUSCULAR | Status: DC | PRN

## 2015-11-09 MED ORDER — HYDROMORPHONE HCL 1 MG/ML IJ SOLN
0.2500 mg | INTRAMUSCULAR | Status: DC | PRN
  Administered 2015-11-09: 0.25 mg via INTRAVENOUS
  Administered 2015-11-09: 0.5 mg via INTRAVENOUS
  Administered 2015-11-09: 0.25 mg via INTRAVENOUS

## 2015-11-09 MED ORDER — BUPIVACAINE-EPINEPHRINE (PF) 0.25% -1:200000 IJ SOLN
INTRAMUSCULAR | Status: AC
  Filled 2015-11-09: qty 30

## 2015-11-09 MED ORDER — 0.9 % SODIUM CHLORIDE (POUR BTL) OPTIME
TOPICAL | Status: DC | PRN
  Administered 2015-11-09 (×2): 1000 mL

## 2015-11-09 MED ORDER — MIDAZOLAM HCL 2 MG/2ML IJ SOLN
INTRAMUSCULAR | Status: DC | PRN
  Administered 2015-11-09: 2 mg via INTRAVENOUS

## 2015-11-09 MED ORDER — HEMOSTATIC AGENTS (NO CHARGE) OPTIME
TOPICAL | Status: DC | PRN
  Administered 2015-11-09: 1 via TOPICAL

## 2015-11-09 MED ORDER — ONDANSETRON HCL 4 MG/2ML IJ SOLN: 4.0000 mg | INTRAMUSCULAR | Status: DC | PRN

## 2015-11-09 MED ORDER — MORPHINE SULFATE (PF) 2 MG/ML IV SOLN
1.0000 mg | INTRAVENOUS | Status: DC | PRN
  Administered 2015-11-09: 2 mg via INTRAVENOUS
  Filled 2015-11-09: qty 1

## 2015-11-09 MED ORDER — OXYCODONE HCL 5 MG PO TABS
10.0000 mg | ORAL_TABLET | ORAL | Status: DC | PRN
  Administered 2015-11-09 – 2015-11-11 (×6): 10 mg via ORAL
  Filled 2015-11-09 (×7): qty 2

## 2015-11-09 MED ORDER — SUCCINYLCHOLINE CHLORIDE 200 MG/10ML IV SOSY
PREFILLED_SYRINGE | INTRAVENOUS | Status: AC
  Filled 2015-11-09: qty 10

## 2015-11-09 MED ORDER — FENTANYL CITRATE (PF) 250 MCG/5ML IJ SOLN
INTRAMUSCULAR | Status: DC | PRN
  Administered 2015-11-09 (×2): 100 ug via INTRAVENOUS
  Administered 2015-11-09: 25 ug via INTRAVENOUS

## 2015-11-09 MED ORDER — METOPROLOL TARTARATE 1 MG/ML SYRINGE (5ML)
Status: DC | PRN
  Administered 2015-11-09: .5 mg via INTRAVENOUS

## 2015-11-09 MED ORDER — HYDROMORPHONE HCL 1 MG/ML IJ SOLN
INTRAMUSCULAR | Status: AC
  Filled 2015-11-09: qty 1

## 2015-11-09 MED ORDER — LACTATED RINGERS IV SOLN
INTRAVENOUS | Status: DC
  Administered 2015-11-10: 1000 mL via INTRAVENOUS

## 2015-11-09 MED ORDER — PHENOL 1.4 % MT LIQD: 1.0000 | OROMUCOSAL | Status: DC | PRN

## 2015-11-09 MED ORDER — FENTANYL CITRATE (PF) 100 MCG/2ML IJ SOLN
INTRAMUSCULAR | Status: AC
  Filled 2015-11-09: qty 4

## 2015-11-09 MED ORDER — ALUM & MAG HYDROXIDE-SIMETH 200-200-20 MG/5ML PO SUSP
30.0000 mL | ORAL | Status: DC | PRN
  Administered 2015-11-09: 30 mL via ORAL
  Filled 2015-11-09: qty 30

## 2015-11-09 MED ORDER — ACETAMINOPHEN 10 MG/ML IV SOLN
INTRAVENOUS | Status: AC
  Filled 2015-11-09: qty 100

## 2015-11-09 MED ORDER — ROCURONIUM BROMIDE 10 MG/ML (PF) SYRINGE
PREFILLED_SYRINGE | INTRAVENOUS | Status: AC
  Filled 2015-11-09: qty 10

## 2015-11-09 SURGICAL SUPPLY — 87 items
ADH SKN CLS APL DERMABOND .7 (GAUZE/BANDAGES/DRESSINGS) ×1
BNDG GAUZE ELAST 4 BULKY (GAUZE/BANDAGES/DRESSINGS) ×1 IMPLANT
BUR EGG ELITE 4.0 (BURR) IMPLANT
CLSR STERI-STRIP ANTIMIC 1/2X4 (GAUZE/BANDAGES/DRESSINGS) ×3 IMPLANT
CORDS BIPOLAR (ELECTRODE) ×2 IMPLANT
COVER MAYO STAND STRL (DRAPES) ×2 IMPLANT
COVER SURGICAL LIGHT HANDLE (MISCELLANEOUS) ×2 IMPLANT
CUFF TOURNIQUET SINGLE 34IN LL (TOURNIQUET CUFF) IMPLANT
CUFF TOURNIQUET SINGLE 44IN (TOURNIQUET CUFF) IMPLANT
DERMABOND ADVANCED (GAUZE/BANDAGES/DRESSINGS) ×1
DERMABOND ADVANCED .7 DNX12 (GAUZE/BANDAGES/DRESSINGS) ×1 IMPLANT
DRAPE C-ARM 42X72 X-RAY (DRAPES) ×2 IMPLANT
DRAPE POUCH INSTRU U-SHP 10X18 (DRAPES) ×2 IMPLANT
DRAPE PROXIMA HALF (DRAPES) ×2 IMPLANT
DRAPE SURG 17X11 SM STRL (DRAPES) ×2 IMPLANT
DRAPE SURG 17X23 STRL (DRAPES) ×2 IMPLANT
DRAPE TABLE COVER HEAVY DUTY (DRAPES) ×2 IMPLANT
DRAPE U-SHAPE 47X51 STRL (DRAPES) ×2 IMPLANT
DRSG ADAPTIC 3X8 NADH LF (GAUZE/BANDAGES/DRESSINGS) ×2 IMPLANT
DRSG AQUACEL AG ADV 3.5X 6 (GAUZE/BANDAGES/DRESSINGS) ×2 IMPLANT
DRSG AQUACEL AG ADV 3.5X10 (GAUZE/BANDAGES/DRESSINGS) ×1 IMPLANT
DRSG MEPILEX BORDER 4X4 (GAUZE/BANDAGES/DRESSINGS) ×1 IMPLANT
DRSG OPSITE 6X11 MED (GAUZE/BANDAGES/DRESSINGS) ×1 IMPLANT
DURAPREP 26ML APPLICATOR (WOUND CARE) ×2 IMPLANT
ELECT BLADE 4.0 EZ CLEAN MEGAD (MISCELLANEOUS) ×2
ELECT BLADE 6.5 EXT (BLADE) ×2 IMPLANT
ELECT CAUTERY BLADE 6.4 (BLADE) ×2 IMPLANT
ELECT PENCIL ROCKER SW 15FT (MISCELLANEOUS) ×2 IMPLANT
ELECT REM PT RETURN 9FT ADLT (ELECTROSURGICAL) ×2
ELECTRODE BLDE 4.0 EZ CLN MEGD (MISCELLANEOUS) ×1 IMPLANT
ELECTRODE REM PT RTRN 9FT ADLT (ELECTROSURGICAL) ×1 IMPLANT
EVACUATOR 1/8 PVC DRAIN (DRAIN) ×1 IMPLANT
FLOSEAL (HEMOSTASIS) ×3 IMPLANT
GAUZE SPONGE 4X4 12PLY STRL (GAUZE/BANDAGES/DRESSINGS) ×2 IMPLANT
GLOVE BIO SURGEON STRL SZ 6.5 (GLOVE) ×2 IMPLANT
GLOVE BIO SURGEON STRL SZ7 (GLOVE) ×2 IMPLANT
GLOVE BIOGEL PI IND STRL 6.5 (GLOVE) ×1 IMPLANT
GLOVE BIOGEL PI IND STRL 7.0 (GLOVE) ×1 IMPLANT
GLOVE BIOGEL PI IND STRL 8.5 (GLOVE) ×1 IMPLANT
GLOVE BIOGEL PI INDICATOR 6.5 (GLOVE) ×1
GLOVE BIOGEL PI INDICATOR 7.0 (GLOVE) ×1
GLOVE BIOGEL PI INDICATOR 8.5 (GLOVE) ×1
GLOVE SS BIOGEL STRL SZ 8 (GLOVE) ×1 IMPLANT
GLOVE SS BIOGEL STRL SZ 8.5 (GLOVE) ×1 IMPLANT
GLOVE SUPERSENSE BIOGEL SZ 8 (GLOVE) ×1
GLOVE SUPERSENSE BIOGEL SZ 8.5 (GLOVE) ×1
GOWN STRL REUS W/ TWL LRG LVL3 (GOWN DISPOSABLE) ×2 IMPLANT
GOWN STRL REUS W/TWL 2XL LVL3 (GOWN DISPOSABLE) ×4 IMPLANT
GOWN STRL REUS W/TWL LRG LVL3 (GOWN DISPOSABLE) ×4
KIT BASIN OR (CUSTOM PROCEDURE TRAY) ×2 IMPLANT
KIT POSITION SURG JACKSON T1 (MISCELLANEOUS) ×2 IMPLANT
KIT ROOM TURNOVER OR (KITS) ×2 IMPLANT
NDL SPNL 18GX3.5 QUINCKE PK (NEEDLE) ×2 IMPLANT
NEEDLE 22X1 1/2 (OR ONLY) (NEEDLE) ×2 IMPLANT
NEEDLE SPNL 18GX3.5 QUINCKE PK (NEEDLE) ×4 IMPLANT
NS IRRIG 1000ML POUR BTL (IV SOLUTION) ×3 IMPLANT
PACK LAMINECTOMY ORTHO (CUSTOM PROCEDURE TRAY) ×2 IMPLANT
PACK UNIVERSAL I (CUSTOM PROCEDURE TRAY) ×2 IMPLANT
PAD ARMBOARD 7.5X6 YLW CONV (MISCELLANEOUS) ×4 IMPLANT
PATTIES SURGICAL .5 X.5 (GAUZE/BANDAGES/DRESSINGS) ×1 IMPLANT
PATTIES SURGICAL .5 X1 (DISPOSABLE) ×3 IMPLANT
SPONGE LAP 4X18 X RAY DECT (DISPOSABLE) ×7 IMPLANT
SPONGE SURGIFOAM ABS GEL 100 (HEMOSTASIS) ×2 IMPLANT
STAPLER VISISTAT 35W (STAPLE) IMPLANT
STRIP CLOSURE SKIN 1/2X4 (GAUZE/BANDAGES/DRESSINGS) ×2 IMPLANT
SURGIFLO W/THROMBIN 8M KIT (HEMOSTASIS) IMPLANT
SUT BONE WAX W31G (SUTURE) ×2 IMPLANT
SUT ETHILON 2 0 FS 18 (SUTURE) ×1 IMPLANT
SUT ETHILON 3 0 FSL (SUTURE) IMPLANT
SUT MON AB 3-0 SH 27 (SUTURE) ×2
SUT MON AB 3-0 SH27 (SUTURE) ×1 IMPLANT
SUT PDS AB 1 CTX 36 (SUTURE) ×2 IMPLANT
SUT VIC AB 0 CTB1 27 (SUTURE) ×4 IMPLANT
SUT VIC AB 1 CT1 27 (SUTURE) ×4
SUT VIC AB 1 CT1 27XBRD ANTBC (SUTURE) ×2 IMPLANT
SUT VIC AB 1 CTX 36 (SUTURE) ×4
SUT VIC AB 1 CTX36XBRD ANBCTR (SUTURE) ×2 IMPLANT
SUT VIC AB 2-0 CT1 18 (SUTURE) ×4 IMPLANT
SUT VIC AB 3-0 X1 27 (SUTURE) IMPLANT
SUT VICRYL 0 UR6 27IN ABS (SUTURE) IMPLANT
SYR BULB IRRIGATION 50ML (SYRINGE) ×2 IMPLANT
SYR CONTROL 10ML LL (SYRINGE) ×4 IMPLANT
TOWEL OR 17X24 6PK STRL BLUE (TOWEL DISPOSABLE) ×2 IMPLANT
TOWEL OR 17X26 10 PK STRL BLUE (TOWEL DISPOSABLE) ×4 IMPLANT
TRAY FOLEY CATH 16FRSI W/METER (SET/KITS/TRAYS/PACK) ×2 IMPLANT
WATER STERILE IRR 1000ML POUR (IV SOLUTION) ×1 IMPLANT
YANKAUER SUCT BULB TIP NO VENT (SUCTIONS) ×2 IMPLANT

## 2015-11-09 NOTE — H&P (Signed)
History of Present Illness  The patient is a 70 year old female who comes in today for a preoperative History and Physical. The patient is scheduled for a Decompression L3-5, removal of pedicle screw L4-5 to be performed by Dr. Duane Lope D. Rolena Infante, MD at [facility] on 11-09-15 . Please see the hospital record for complete dictated history and physical. Pt has a hx of DVT and PE back in 2011. Pt was on hormones long term at the time. Those have benn discontinued.  Additional reasons for visit:  Transition into care is described as the following: The patient is transitioning into care and a summary of care was reviewed.   Problem List/Past Medical  Problems Reconciled  Thumb pain, left (M79.645)  Other secondary osteoarthritis of left hand [id number])  Primary osteoarthritis of first carpometacarpal joint of right hand (M18.11)  Sacroiliac joint pain (M53.3)  Acute midline low back pain with right-sided sciatica (M54.41)  S/P lumbar fusion (Z98.1)  Finger mass, left (R22.32)  index Aftercare following surgery of the musculoskeletal system (Z47.89)  Ganglion, left hand (M67.442)  Mucous cyst of left index  Allergies  MethylPREDNISolone Sodium Succ *CORTICOSTEROIDS*  heart palpatations SULFA ?  Allergies Reconciled   Family History  Chronic Obstructive Lung Disease  mother Cerebrovascular Accident  grandmother mothers side Heart Disease  father Hypertension  grandmother mothers side Heart disease in female family member before age 7  Cancer  brother  Social History  Tobacco / smoke exposure  no Tobacco use  Former smoker. former smoker; smoke(d) 1 pack(s) per day Living situation  live with spouse Illicit drug use  no Marital status  married Pain Contract  no Number of flights of stairs before winded  4-5 Exercise  Exercises daily; does running / walking Children  3 Alcohol use  Occasional alcohol use. current drinker; drinks beer and wine; only  occasionally per week Current work status  retired Engineer, agricultural (Previously)  no Drug/Alcohol Rehab (Currently)  no  Medication History  ZyrTEC Allergy (10MG  Tablet, Oral) Active. (prn) Omeprazole (40MG  Capsule DR, Oral) Active. (qd) Medications Reconciled  Past Surgical History  Gallbladder Surgery  laporoscopic Cataract Surgery  bilateral Hemorrhoidectomy  Tonsillectomy  Spinal Surgery  Carpal Tunnel Repair  left Appendectomy  filter placement for DVT/Lung  Lt. wrist CTR  Back Surgery  L4-5 XLIF Cataract Extraction-Bilateral  Tubal Ligation   Other Problems Blood Clot  Diverticulitis Of Colon  Gastroesophageal Reflux Disease   Vitals  11/02/2015 10:12 AM Weight: 193 lb Height: 64in Body Surface Area: 1.93 m Body Mass Index: 33.13 kg/m  Temp.: 98.21F(Oral)  Pulse: 70 (Regular)  BP: 135/81 (Sitting, Right Arm, Standard)  The physical exam findings are as follows: Note:Increasing pain noted the longer the pt is in an upright position  General General Appearance-Not in acute distress. Orientation-Oriented X3. Build & Nutrition-Well nourished and Well developed.  Integumentary General Characteristics Surgical Scars - surgical scarring consistent with previous lumbar surgery. Lumbar Spine-Skin examination of the lumbar spine is without deformity, skin lesions, lacerations or abrasions.  Chest and Lung Exam Auscultation Breath sounds - Normal and Clear.  Cardiovascular Auscultation Rhythm - Regular rate and rhythm.  Abdomen Palpation/Percussion Palpation and Percussion of the abdomen reveal - Soft, Non Tender and No Rebound tenderness.  Peripheral Vascular Lower Extremity Palpation - Posterior tibial pulse - Bilateral - 2+. Dorsalis pedis pulse - Bilateral - 2+.  Neurologic Sensation Lower Extremity - Left - sensation is intact in the lower extremity. Right - sensation is diminished  in the lower  extremity. Reflexes Patellar Reflex - Bilateral - 2+. Achilles Reflex - Bilateral - 2+. Clonus - Bilateral - clonus not present. Hoffman's Sign - Bilateral - Hoffman's sign not present. Testing Seated Straight Leg Raise - Bilateral - Seated straight leg raise negative.  Musculoskeletal Spine/Ribs/Pelvis  Lumbosacral Spine: Inspection and Palpation - Tenderness - left lumbar paraspinals tender to palpation and right lumbar paraspinals tender to palpation. Strength and Tone: Strength - Hip Flexion - Bilateral - 5/5. Knee Extension - Bilateral - 5/5. Knee Flexion - Bilateral - 5/5. Ankle Dorsiflexion - Bilateral - 5/5. Ankle Plantarflexion - Bilateral - 5/5. Heel walk - Bilateral - able to heel walk without difficulty. Toe Walk - Bilateral - able to walk on toes without difficulty. Heel-Toe Walk - Bilateral - able to heel-toe walk without difficulty. ROM - Flexion - moderately decreased range of motion. Extension - moderately decreased range of motion. Left Lateral Bending - moderately decreased range of motion and painful. Right Lateral Bending - moderately decreased range of motion and painful. Right Rotation - moderately decreased range of motion and painful. Left Rotation - moderately decreased range of motion and painful. Pain - . Lumbosacral Spine - Waddell's Signs - no Waddell's signs present. Lower Extremity Range of Motion - No true hip, knee or ankle pain with range of motion. Gait and Station - [employer] - no assistive devices.  IMAGING At this point in time, her CT scan was reviewed. At L3-4, there is adjacent segment spinal stenosis as well as at L4-5. She has a solid fusion at L4-5 interbody and posterior facet.  Assessment & Plan   Goal Of Surgery: Discussed that goal of surgery is to reduce pain and improve function and quality of life. Patient is aware that despite all appropriate treatment that there pain and function could be the same, worse, or different. Posterior Lumbar  Decompression/disectomy: Risks of surgery include infection, bleeding, nerve damage, death, stroke, paralysis, failure to heal, need for further surgery, ongoing or worse pain, need for further surgery, CSF leak, loss of bowel or bladder, and recurrent disc herniation or Stenosis which would necessitate need for further surgery.  Plans Transcription At this point in time, we spent a long portion discussing treatment options, because of the severe pain and decreased quality of life and the failure to improve with injections and physical therapy, she has expressed the desire to move forward with surgery. To address this surgically, I would remove the posterior fixation and perform a posterior decompression at L3-4 and L4-5. This would allow me to address the spinal stenosis, which I think is the source of her problems. I have reviewed the risks with her which include infection, bleeding, nerve damage, death, stroke, paralysis, failure to improve, need for further surgery, ongoing or worse pain. All of her and her husband's questions were addressed. The plan is to remove the posterior pedicle screw construct and move forward with a lumbar decompression L3-4 and L4-5.

## 2015-11-09 NOTE — Brief Op Note (Signed)
11/09/2015  12:19 PM  PATIENT:  Elizabeth Crawford  70 y.o. female  PRE-OPERATIVE DIAGNOSIS:  RECURRENT SPINAL STENOSIS  POST-OPERATIVE DIAGNOSIS:  RECURRENT SPINAL STENOSIS  PROCEDURE:  Procedure(s): DECOMPRESSION L2-5 REMOVAL PEDICULE SCREWS L4-5 (N/A) HARDWARE REMOVAL (N/A)  SURGEON:  Surgeon(s) and Role:    * Melina Schools, MD - Primary  PHYSICIAN ASSISTANT:   ASSISTANTS: Carmen Mayo   ANESTHESIA:   general  EBL:  Total I/O In: 2000 [I.V.:2000] Out: 665 [Urine:165; Blood:500]  BLOOD ADMINISTERED:none  DRAINS: Flat JP drain   LOCAL MEDICATIONS USED:  NONE  SPECIMEN:  No Specimen  DISPOSITION OF SPECIMEN:  N/A  COUNTS:  YES  TOURNIQUET:  * No tourniquets in log *  DICTATION: .Other Dictation: Dictation Number [id number]  PLAN OF CARE: Admit to inpatient   PATIENT DISPOSITION:  PACU - hemodynamically stable.

## 2015-11-09 NOTE — Anesthesia Procedure Notes (Signed)
Procedure Name: Intubation Date/Time: 11/09/2015 9:17 AM Performed by: Nolon Nations Pre-anesthesia Checklist: Patient identified, Emergency Drugs available, Suction available and Patient being monitored Patient Re-evaluated:Patient Re-evaluated prior to inductionOxygen Delivery Method: Circle system utilized Preoxygenation: Pre-oxygenation with 100% oxygen Intubation Type: IV induction Ventilation: Oral airway inserted - appropriate to patient size Laryngoscope Size: Mac and 3 Grade View: Grade I Tube type: Oral Tube size: 7.0 mm Number of attempts: 1 Placement Confirmation: ETT inserted through vocal cords under direct vision,  positive ETCO2 and breath sounds checked- equal and bilateral Secured at: 23 cm Dental Injury: Teeth and Oropharynx as per pre-operative assessment

## 2015-11-09 NOTE — Progress Notes (Signed)
Patient admitted from PACU. Patient alert and oriented x 4. Patient oriented to room and made comfortable. 

## 2015-11-09 NOTE — Op Note (Signed)
Elizabeth Crawford, Elizabeth Crawford              ACCOUNT NO.:  [id number]  MEDICAL RECORD NO.:  [id number]  LOCATION:  5M07C                        FACILITY:  [facility]  PHYSICIAN:  Lashala Laser D. Rolena Infante, M.D. DATE OF BIRTH:  [birth year]  DATE OF PROCEDURE:  11/09/2015 DATE OF DISCHARGE:                              OPERATIVE REPORT   PREOPERATIVE DIAGNOSIS:  Adjacent segment lumbar spinal stenosis.  POSTOPERATIVE DIAGNOSIS:  Adjacent segment lumbar spinal stenosis.  OPERATIVE PROCEDURE:  L3-L5 lumbar decompression with removal of pedicle screw hardware L4-5.  Actual procedure was L2-L5 decompression with removal of pedicle screw construct at L4-5.  SURGEON:  Arless Vineyard D. Rolena Infante, M.D.  COMPLICATIONS:  None.  CONDITION:  Stable.  HISTORY:  This is a very pleasant 70 year old woman who had an XLIF with posterior pedicle screw fixation approximately 6 years ago.  She has done well until recently when she has been having more and more back, buttock and bilateral leg pain, right worse than the left.  Imaging studies confirmed adjacent segment spinal stenosis and lateral recess stenosis at L3-4 and to a lesser degree, 2-3 and 4-5.  After discussing treatment options and failing conservative management, we elected to proceed with surgery.  Intraoperatively, after decompressing the 3-4 level, I noticed there was still some lateral recess compromise and so I elected to proceed superiorly and removed the entire L3 spinous process and do a complete L3 laminectomy to ensure that I had an adequate decompression.  I then also did a partial laminotomy of L2 just to ensure adequate decompression just above the disk space since on MRI there was some moderate spinal stenosis.  OPERATIVE NOTE:  The patient was brought to the operating room and placed supine on the operating table.  After successful induction of general anesthesia and endotracheal intubation, TEDs, SCDs, and a Foley were inserted.  The patient was  turned prone onto the Wilson frame.  All bony prominences were well padded.  The back was prepped and draped in a standard fashion.  A time-out was taken confirming the patient, procedure, and all other pertinent important data.  X-ray was used to identify the 4-5 level based on the hardware.  A midline incision was then made.  Sharp dissection was carried out down to the deep fascia.  I exposed the deep fascia and stripped the paraspinal muscles to expose the L3 and L4 spinous process as well as on the left-hand side where the L4-5 pedicle screw construct was and the posterior bony spine exposed. I then removed the pedicle screws.  The top lock was removed and then the rod and the 2 pedicle screws.  There were no complicating features with this.  Thrombin-soaked Gelfoam patty was placed into the pedicle screw holes to minimize  bleeding.  I then marked the L3-4 and L4-5 interspinous process space and then used double-action Leksell rongeur to remove the spinous process of L4 and L3.  I then used a 3.0 Karlin curette to dissect underneath the lamina of L3.  Probably, there was significant amount of scar tissue from the pedicle screw placement and facet hypertrophy as well as facet synovitis.  Once I had a plane between the ligamentum flavum and the bone,  I used my 3-mm Kerrison punch to perform a very generous laminotomy of L3.  I then released the ligamentum flavum and I used my Penfield 4 to create a space between ligamentum flavum and the dura, and then continued using a 2 and 3 mm punch to complete the central decompression and laminectomy of L3.  I then went into the lateral recess and decompressed into the lateral recess.  When I tried to go superiorly in the lateral recess and noted there to be some difficulty and so a laminotomy of L2 was performed with a 3-mm Kerrison punch.  I removed the ligamentum flavum, went into the lateral recess and ensured I had adequate decompression.  I  then identified the disk protrusion at L3-4.  At this point, it was not a significant herniation and I did not want to violate the disk.  At this point, the disk was appearing healthy and had a very adequate central and lateral recess decompression, and so I did not feel as though removing the disk bulge was in her best interest.  I then carried my decompression inferiorly creating an L4 laminectomy in a similar fashion with Kerrison punch.  I again went into the lateral recess and decompressed this as well.  At this point, I could freely pass my [facility] out the L4-L3 and towards the L2 foramen bilaterally and in the lateral recess.  At this point, I was pleased with my decompression.  I irrigated the wound copiously with normal saline using bipolar electrocautery and FloSeal and maintained hemostasis.  At this point, given the extensive decompression, I did place a drain to prevent minimize postoperative seroma formation.  I irrigated the wound copiously with about a liter and half of fluid and then closed the deep fascia with a #1 Stratafix and then a 2-0 Vicryl suture and a 3-0 Monocryl.  Steri-Strips and dry dressing were applied, and the patient was ultimately extubated, transferred to PACU without incident.  At the end of the case, all needle and sponge counts were correct.  There were no adverse intraoperative events.  First assistant was [employer], my PA.     Glennie Rodda D. Rolena Infante, M.D.     DDB/MEDQ  D:  11/09/2015  T:  11/09/2015  Job:  [id number]  cc:   Dr. Rolena Infante' office

## 2015-11-09 NOTE — Anesthesia Preprocedure Evaluation (Signed)
Anesthesia Evaluation  Patient identified by MRN, date of birth, ID band Patient awake    Reviewed: Allergy & Precautions, NPO status , Patient's Chart, lab work & pertinent test results  History of Anesthesia Complications (+) PONV and history of anesthetic complications  Airway Mallampati: II  TM Distance: >3 FB Neck ROM: Full    Dental no notable dental hx.    Pulmonary neg pulmonary ROS, former smoker,    Pulmonary exam normal breath sounds clear to auscultation       Cardiovascular negative cardio ROS Normal cardiovascular exam Rhythm:Regular Rate:Normal     Neuro/Psych negative neurological ROS  negative psych ROS   GI/Hepatic Neg liver ROS, GERD  ,  Endo/Other  negative endocrine ROS  Renal/GU negative Renal ROS     Musculoskeletal negative musculoskeletal ROS (+) Arthritis ,   Abdominal   Peds  Hematology negative hematology ROS (+)   Anesthesia Other Findings   Reproductive/Obstetrics negative OB ROS                             Anesthesia Physical Anesthesia Plan  ASA: II  Anesthesia Plan: General   Post-op Pain Management:    Induction: Intravenous  Airway Management Planned: Oral ETT  Additional Equipment:   Intra-op Plan:   Post-operative Plan: Extubation in OR  Informed Consent: I have reviewed the patients History and Physical, chart, labs and discussed the procedure including the risks, benefits and alternatives for the proposed anesthesia with the patient or authorized representative who has indicated his/her understanding and acceptance.   Dental advisory given  Plan Discussed with: CRNA  Anesthesia Plan Comments:         Anesthesia Quick Evaluation

## 2015-11-09 NOTE — Transfer of Care (Signed)
Immediate Anesthesia Transfer of Care Note  Patient: Elizabeth Crawford  Procedure(s) Performed: Procedure(s): DECOMPRESSION L2-5 REMOVAL PEDICULE SCREWS L4-5 (N/A) HARDWARE REMOVAL (N/A)  Patient Location: PACU  Anesthesia Type:General  Level of Consciousness: awake, alert , oriented and patient cooperative  Airway & Oxygen Therapy: Patient Spontanous Breathing and Patient connected to face mask oxygen  Post-op Assessment: Report given to RN, Post -op Vital signs reviewed and stable, Patient moving all extremities X 4 and Patient able to stick tongue midline  Post vital signs: Reviewed and stable  Last Vitals:  Vitals:   11/09/15 0845  BP: 126/74  Pulse: 81  Resp: 20  Temp: 36.9 C    Last Pain:  Vitals:   11/09/15 0845  TempSrc: Oral         Complications: No apparent anesthesia complications

## 2015-11-10 ENCOUNTER — Encounter (HOSPITAL_COMMUNITY): Payer: Self-pay | Admitting: Orthopedic Surgery

## 2015-11-10 MED ORDER — CEFAZOLIN IN D5W 1 GM/50ML IV SOLN
1.0000 g | Freq: Three times a day (TID) | INTRAVENOUS | Status: DC
  Administered 2015-11-10 – 2015-11-11 (×3): 1 g via INTRAVENOUS
  Filled 2015-11-10 (×5): qty 50

## 2015-11-10 NOTE — Care Management Note (Signed)
Case Management Note  Patient Details  Name: Elizabeth Crawford MRN: [id number] Date of Birth: 1946-03-25  Subjective/Objective:  Pt lives with spouse, states he will provide 24/7 assistance.  States she did well when ambulating this morning and anticipates discharge tomorrow.  Has walker and BSC from previous hospitalization.                             Expected Discharge Plan:  Home/Self Care  Discharge planning Services  CM Consult  Status of Service:  Completed, signed off  Girard Cooter, [place] 11/10/2015, 2:26 PM

## 2015-11-10 NOTE — Clinical Social Work Note (Signed)
CSW consulted for New SNF. CSW discussed pt with RN and Agricultural consultant during progression. PT is not recommending any follow up. CSW is signing off as no further needs identified.   Darden Dates, MSW, LCSW  Clinical Social Worker  [phone]

## 2015-11-10 NOTE — Progress Notes (Signed)
    Subjective: Procedure(s) (LRB): DECOMPRESSION L2-5 REMOVAL PEDICULE SCREWS L4-5 (N/A) HARDWARE REMOVAL (N/A) 1 Day Post-Op  Patient reports pain as 2 on 0-10 scale.  Reports deferred at this time leg pain reports incisional back pain   Positive void Negative bowel movement Positive flatus Negative chest pain or shortness of breath  Objective: Vital signs in last 24 hours: Temp:  [97.4 F (36.3 C)-99.7 F (37.6 C)] 99.4 F (37.4 C) (08/18 0534) Pulse Rate:  [72-89] 87 (08/18 0534) Resp:  [18-20] 18 (08/18 0534) BP: (94-127)/(42-75) 101/44 (08/18 0534) SpO2:  [95 %-100 %] 97 % (08/18 0534) Weight:  [93.6 kg (206 lb 5.6 oz)] 93.6 kg (206 lb 5.6 oz) (08/17 2131)  Intake/Output from previous day: 08/17 0701 - 08/18 0700 In: 2858 [I.V.:2703; IV Piggyback:155] Out: 2030 [Urine:1240; Drains:290; Blood:500]  Labs: No results for input(s): WBC, RBC, HCT, PLT in the last 72 hours. No results for input(s): NA, K, CL, CO2, BUN, CREATININE, GLUCOSE, CALCIUM in the last 72 hours. No results for input(s): LABPT, INR in the last 72 hours.  Physical Exam: Neurologically intact ABD soft Intact pulses distally Incision: dressing C/D/I Compartment soft  Assessment/Plan: Patient stable  xrays b/a Continue mobilization with physical therapy Continue care  Advance diet Up with therapy  Continue abx as drain will remain today Ambulation today Plan on d/c tomorrow    Melina Schools, MD [place] [phone]

## 2015-11-10 NOTE — Evaluation (Signed)
Physical Therapy Evaluation Patient Details Name: Elizabeth Crawford MRN: [id number] DOB: [birth year] Today's Date: 11/10/2015   History of Present Illness  Patient is a 70 y/o female with hx of PE and DVT presents s/p L3-L5 lumbar decompression with removal of pedicle and L2-L5 decompression with removal of pedicle screw construct at L4-5.   Clinical Impression  Patient presents with pain and post surgical deficits s/p above surgery impacting mobility. Education re: back brace, precautions and positioning. Tolerated gait training and stair training with Min guard assist for safety. Attempted ambulation without use of RW and this increased pain and caused instability so recommend using RW until mobility/pain improve. Will have support from spouse at d/c. Will follow acutely to maximize independence and mobility prior to return home.    Follow Up Recommendations No PT follow up;Supervision - Intermittent    Equipment Recommendations  None recommended by PT    Recommendations for Other Services       Precautions / Restrictions Precautions Precautions: Back Precaution Booklet Issued: No Precaution Comments: Reviewed back precautions. Required Braces or Orthoses: Spinal Brace Spinal Brace: Lumbar corset Restrictions Weight Bearing Restrictions: No      Mobility  Bed Mobility               General bed mobility comments: Sitting EOB upon PT arrival.   Transfers Overall transfer level: Needs assistance Equipment used: Rolling walker (2 wheeled) Transfers: Sit to/from Stand Sit to Stand: Supervision         General transfer comment: Supervision for safety. Pt pulling up on RW with UEs. Transferred to chair post ambulation bout x2.   Ambulation/Gait Ambulation/Gait assistance: Min guard Ambulation Distance (Feet): 150 Feet Assistive device: Rolling walker (2 wheeled) Gait Pattern/deviations: Step-through pattern;Decreased stride length Gait velocity: decreased Gait  velocity interpretation: Below normal speed for age/gender General Gait Details: Slow, steady gait. Cues to adhere to back precautions during mobility. Cues for RW proximity. Tolerated gait training without RW but pain increased and more unsteady.  Stairs Stairs: Yes Stairs assistance: Supervision Stair Management: Alternating pattern;Two rails Number of Stairs: 3 (+ 2 steps x2 bouts) General stair comments: Cues for technique/safety.   Wheelchair Mobility    Modified Rankin (Stroke Patients Only)       Balance Overall balance assessment: Needs assistance Sitting-balance support: Feet supported;No upper extremity supported Sitting balance-Leahy Scale: Good Sitting balance - Comments: ABle to donn brace with setup.   Standing balance support: During functional activity Standing balance-Leahy Scale: Fair Standing balance comment: Able to ambulate without UE support but this increased pain; instability.                             Pertinent Vitals/Pain Pain Assessment: Faces Faces Pain Scale: Hurts even more Pain Location: soreness at incision/back Pain Descriptors / Indicators: Sore;Operative site guarding Pain Intervention(s): Monitored during session;Repositioned    Home Living Family/patient expects to be discharged to:: Private residence Living Arrangements: Spouse/significant other Available Help at Discharge: Family;Available 24 hours/day Type of Home: House Home Access: Stairs to enter Entrance Stairs-Rails: Right Entrance Stairs-Number of Steps: 3 Home Layout: Two level;Bed/bath upstairs Home Equipment: Walker - 2 wheels;Bedside commode      Prior Function Level of Independence: Independent         Comments: Drives.      Hand Dominance        Extremity/Trunk Assessment   Upper Extremity Assessment: Defer to OT evaluation  Lower Extremity Assessment: Overall WFL for tasks assessed      Cervical / Trunk Assessment:  Other exceptions  Communication   Communication: No difficulties  Cognition Arousal/Alertness: Awake/alert Behavior During Therapy: WFL for tasks assessed/performed Overall Cognitive Status: Within Functional Limits for tasks assessed                      General Comments General comments (skin integrity, edema, etc.): Spouse present during session. Pt not happy about the way the LSO fits, concerned that it comes down too far inferiorly anda ffects her ability to sit on toilet and up in chair.    Exercises        Assessment/Plan    PT Assessment Patient needs continued PT services  PT Diagnosis Difficulty walking;Acute pain   PT Problem List Decreased strength;Decreased mobility;Decreased knowledge of precautions;Decreased activity tolerance;Pain;Decreased balance  PT Treatment Interventions Therapeutic activities;Gait training;Therapeutic exercise;Patient/family education;Balance training;Stair training;Functional mobility training;Neuromuscular re-education;DME instruction   PT Goals (Current goals can be found in the Care Plan section) Acute Rehab PT Goals Patient Stated Goal: to be able to stand up straight and walk PT Goal Formulation: With patient Time For Goal Achievement: 11/24/15 Potential to Achieve Goals: Good    Frequency Min 5X/week   Barriers to discharge Inaccessible home environment stairs to get to bedroom    Co-evaluation               End of Session Equipment Utilized During Treatment: Back brace Activity Tolerance: Patient tolerated treatment well Patient left: in chair;with call bell/phone within reach;with family/visitor present Nurse Communication: Mobility status         Time: [id number] PT Time Calculation (min) (ACUTE ONLY): 19 min   Charges:   PT Evaluation $PT Eval Low Complexity: 1 Procedure     PT G Codes:        Jameria Bradway A Roschelle Calandra 11/10/2015, 9:55 AM Wray Kearns, PT, DPT [phone]

## 2015-11-11 MED ORDER — MAGNESIUM CITRATE PO SOLN
0.5000 | Freq: Once | ORAL | Status: AC
  Administered 2015-11-11: 0.5 via ORAL
  Filled 2015-11-11: qty 296

## 2015-11-11 MED ORDER — BISACODYL 10 MG RE SUPP
10.0000 mg | Freq: Once | RECTAL | Status: AC
  Administered 2015-11-11: 10 mg via RECTAL
  Filled 2015-11-11: qty 1

## 2015-11-11 MED ORDER — LUBIPROSTONE 24 MCG PO CAPS: 24.0000 ug | ORAL_CAPSULE | Freq: Two times a day (BID) | ORAL | Status: DC

## 2015-11-11 NOTE — Evaluation (Signed)
Occupational Therapy Evaluation/Discharge Patient Details Name: Elizabeth Crawford MRN: [id number] DOB: [birth year] Today's Date: 11/11/2015    History of Present Illness Patient is a 70 y/o female with hx of PE and DVT presents s/p L3-L5 lumbar decompression with removal of pedicle and L2-L5 decompression with removal of pedicle screw construct at L4-5.    Clinical Impression   PTA, pt was independent with ADLs and mobility. Pt currently requires supervision for basic transfers and min assist for LB ADLs which her husband can provide. Educated pt/husband on back precautions, brace wear protocol, compensatory strategies for ADLs, use of AE and DME, pain management strategies, and fall prevention/home safety strategies. All education has been completed and pt has no further questions. Pt with no further acute OT needs. OT signing off. Thank you for this referral.    Follow Up Recommendations  No OT follow up;Supervision/Assistance - 24 hour    Equipment Recommendations  Other (comment) (RW-2 wheeled)    Recommendations for Other Services       Precautions / Restrictions Precautions Precautions: Back Precaution Booklet Issued: No Precaution Comments: Reviewed back precautions. Required Braces or Orthoses: Spinal Brace Spinal Brace: Lumbar corset;Applied in sitting position Restrictions Weight Bearing Restrictions: No      Mobility Bed Mobility Overal bed mobility: Needs Assistance Bed Mobility: Rolling;Sidelying to Sit;Sit to Sidelying Rolling: Min guard Sidelying to sit: Min guard     Sit to sidelying: Min guard General bed mobility comments: HOB flat, no use of bedrails, exited on R side to simulate home environment. Pt able to complete log roll technique with increased time and effort.   Transfers Overall transfer level: Needs assistance Equipment used: Rolling walker (2 wheeled) Transfers: Sit to/from Stand Sit to Stand: Supervision         General transfer  comment: Supervision for safety. VCs for safe hand placement.    Balance Overall balance assessment: Needs assistance Sitting-balance support: No upper extremity supported;Feet supported Sitting balance-Leahy Scale: Good Sitting balance - Comments: Able to donn brace with assist from spouse.   Standing balance support: Bilateral upper extremity supported;During functional activity Standing balance-Leahy Scale: Fair Standing balance comment: able to maintain static standing balance without UE support                            ADL Overall ADL's : Needs assistance/impaired Eating/Feeding: Modified independent;Sitting   Grooming: Wash/dry hands;Supervision/safety;Standing   Upper Body Bathing: Supervision/ safety;Sitting   Lower Body Bathing: Minimal assistance;Sit to/from stand Lower Body Bathing Details (indicate cue type and reason): unable to cross ankle-over-knee; has long-handled sponge Upper Body Dressing : Supervision/safety;Sitting   Lower Body Dressing: Minimal assistance;Sit to/from stand Lower Body Dressing Details (indicate cue type and reason): unable to cross ankle-over-knee; has reacher; husband can assist Toilet Transfer: Supervision/safety;Ambulation;BSC;RW   Toileting- Clothing Manipulation and Hygiene: Supervision/safety;Sit to/from stand Toileting - Clothing Manipulation Details (indicate cue type and reason): educated on use of wet wipes Tub/ Shower Transfer: Walk-in shower;Min guard;Ambulation;Rolling walker Tub/Shower Transfer Details (indicate cue type and reason): advised pt's husband to provide assistance  Functional mobility during ADLs: Supervision/safety;Rolling walker       Vision Vision Assessment?: No apparent visual deficits   Perception     Praxis      Pertinent Vitals/Pain Pain Assessment: 0-10 Pain Score: 3  Faces Pain Scale: Hurts a little bit Pain Location: incision site Pain Descriptors / Indicators: Sore Pain  Intervention(s): Limited activity within patient's tolerance;Monitored during session;Repositioned;Premedicated  before session;Ice applied     Hand Dominance Right   Extremity/Trunk Assessment Upper Extremity Assessment Upper Extremity Assessment: Overall WFL for tasks assessed   Lower Extremity Assessment Lower Extremity Assessment: Overall WFL for tasks assessed   Cervical / Trunk Assessment Cervical / Trunk Assessment: Other exceptions Cervical / Trunk Exceptions: s/p spine surgery   Communication Communication Communication: No difficulties   Cognition Arousal/Alertness: Awake/alert Behavior During Therapy: WFL for tasks assessed/performed Overall Cognitive Status: Within Functional Limits for tasks assessed                     General Comments       Exercises       Shoulder Instructions      Home Living Family/patient expects to be discharged to:: Private residence Living Arrangements: Spouse/significant other Available Help at Discharge: Family;Available 24 hours/day Type of Home: House Home Access: Stairs to enter [employer] of Steps: 3 Entrance Stairs-Rails: Right Home Layout: Two level;Bed/bath upstairs Alternate Level Stairs-Number of Steps: 1 flight Alternate Level Stairs-Rails: Right Bathroom Shower/Tub: Occupational psychologist: Handicapped height     Home Equipment: Bedside commode;Hand held shower head;Adaptive equipment Adaptive Equipment: Reacher;Long-handled sponge        Prior Functioning/Environment Level of Independence: Independent        Comments: Drives.     OT Diagnosis: Acute pain   OT Problem List: Decreased strength;Decreased activity tolerance;Impaired balance (sitting and/or standing);Decreased safety awareness;Decreased knowledge of use of DME or AE;Decreased knowledge of precautions;Obesity;Pain   OT Treatment/Interventions:      OT Goals(Current goals can be found in the care plan section)  Acute Rehab OT Goals Patient Stated Goal: to be able to stand up straight and walk OT Goal Formulation: With patient Time For Goal Achievement: 11/25/15 Potential to Achieve Goals: Good  OT Frequency:     Barriers to D/C:            Co-evaluation              End of Session Equipment Utilized During Treatment: Gait belt;Rolling walker;Back brace Nurse Communication: Mobility status;Other (comment) (Pt will need RW at d/c)  Activity Tolerance: Patient tolerated treatment well Patient left: in bed;with call bell/phone within reach;with family/visitor present   Time: [id number] OT Time Calculation (min): 28 min Charges:  OT General Charges $OT Visit: 1 Procedure OT Evaluation $OT Eval Moderate Complexity: 1 Procedure OT Treatments $Self Care/Home Management : 8-22 mins G-Codes:    Redmond Baseman, OTR/L Pager: [phone] 11/11/2015, 9:18 AM

## 2015-11-11 NOTE — Progress Notes (Signed)
CM received call that patient in fact needs RW. [facility] contacted to deliver RW to bedside.

## 2015-11-11 NOTE — Discharge Summary (Signed)
Physician Discharge Summary   Patient ID: Elizabeth Crawford MRN: [id number] DOB/AGE: 1945/07/11 70 y.o.  Admit date: 11/09/2015 Discharge date: 11/11/2015  Primary Diagnosis:   RECURRENT SPINAL STENOSIS  Admission Diagnoses:  Past Medical History:  Diagnosis Date  . Allergy   . Arthritis   . Complication of anesthesia   . DVT (deep venous thrombosis) ([place])   . GERD (gastroesophageal reflux disease)   . H/O seasonal allergies   . PONV (postoperative nausea and vomiting)    Gets sick every time  . Pulmonary embolism ([place])   . Spondylolisthesis    Discharge Diagnoses:   Active Problems:   Back pain  Procedure:  Procedure(s) (LRB): DECOMPRESSION L2-5 REMOVAL PEDICULE SCREWS L4-5 (N/A) HARDWARE REMOVAL (N/A)   Consults: None  HPI:  see H&P    Laboratory Data: Hospital Outpatient Visit on 11/07/2015  Component Date Value Ref Range Status  . MRSA, PCR 11/07/2015 NEGATIVE  NEGATIVE Final  . Staphylococcus aureus 11/07/2015 NEGATIVE  NEGATIVE Final   Comment:        The Xpert SA Assay (FDA approved for NASAL specimens in patients over 42 years of age), is one component of a comprehensive surveillance program.  Test performance has been validated by [facility] for patients greater than or equal to 74 year old. It is not intended to diagnose infection nor to guide or monitor treatment.    No results for input(s): HGB in the last 72 hours. No results for input(s): WBC, RBC, HCT, PLT in the last 72 hours. No results for input(s): NA, K, CL, CO2, BUN, CREATININE, GLUCOSE, CALCIUM in the last 72 hours. No results for input(s): LABPT, INR in the last 72 hours.  X-Rays:Ct Lumbar Spine Wo Contrast  Result Date: 10/17/2015 CLINICAL DATA:  Acute midline low back pain with right-sided sciatica. Lumbar fusion EXAM: CT LUMBAR SPINE WITHOUT CONTRAST TECHNIQUE: Multidetector CT imaging of the lumbar spine was performed without intravenous contrast administration. Multiplanar CT  image reconstructions were also generated. COMPARISON:  Lumbar x-rays 08/16/2010 FINDINGS: Infrarenal IVC filter unchanged in position. Mild atherosclerotic disease in the aorta without aneurysm. No retroperitoneal mass or adenopathy. Mild retrolisthesis L1-2, L2-3, L3-4.  7 mm anterolisthesis L4-5. Negative for fracture or mass lesion. No acute bony abnormality. Mild levoscoliosis at L4-5. L1-2:  Mild retrolisthesis.  Mild disc bulging without stenosis L2-3: Mild retrolisthesis. Disc bulging and mild spurring. Mild facet degeneration with mild spinal stenosis. L3-4: Mild retrolisthesis. Mild disc bulging and spurring. Bilateral facet hypertrophy. Moderate spinal stenosis L4-5: Solid interbody fusion with spacer. Unilateral pedicle screw fusion on the left. Solid fusion of the facet joints bilaterally. Severe spinal stenosis. Neural foramina adequately patent. L5-S1: Mild disc degeneration. Moderate facet hypertrophy. No significant spinal or foraminal stenosis. IMPRESSION: Mild spinal stenosis L2-3 Moderate spinal stenosis L3-4 Severe spinal stenosis L4-5. Solid interbody fusion. Solid bilateral facet joint effusion. 7 mm anterior slip contributes to spinal stenosis. Electronically Signed   By: Franchot Gallo M.D.   On: 10/17/2015 18:02  Dg Lumbar Spine 1 View  Result Date: 11/09/2015 CLINICAL DATA:  L2 through L5 decompression and removal of pedicle screws at L4-5. EXAM: LUMBAR SPINE - 1 VIEW; DG C-ARM 61-120 MIN COMPARISON:  CT myelogram dated 10/17/2015. FINDINGS: Two lateral C-arm views of the lumbosacral region are submitted for interpretation. The first demonstrates a metallic localizer overlying the posterior aspect of the L5 vertebral body. The second demonstrates a localizer projected posterior to the L4 vertebral body. Grade 1 anterolisthesis is again demonstrated at  the L4-5 level as well as interbody hardware. Level determination is based on the labeling of the levels previously. An inferior vena  cava filter is again demonstrated. Its tip is at the upper L3 level. IMPRESSION: Localizers, as described above. Electronically Signed   By: Claudie Revering M.D.   On: 11/09/2015 13:44   Dg C-arm 61-120 Min  Result Date: 11/09/2015 CLINICAL DATA:  L2 through L5 decompression and removal of pedicle screws at L4-5. EXAM: LUMBAR SPINE - 1 VIEW; DG C-ARM 61-120 MIN COMPARISON:  CT myelogram dated 10/17/2015. FINDINGS: Two lateral C-arm views of the lumbosacral region are submitted for interpretation. The first demonstrates a metallic localizer overlying the posterior aspect of the L5 vertebral body. The second demonstrates a localizer projected posterior to the L4 vertebral body. Grade 1 anterolisthesis is again demonstrated at the L4-5 level as well as interbody hardware. Level determination is based on the labeling of the levels previously. An inferior vena cava filter is again demonstrated. Its tip is at the upper L3 level. IMPRESSION: Localizers, as described above. Electronically Signed   By: Claudie Revering M.D.   On: 11/09/2015 13:44    EKG: Orders placed or performed during the hospital encounter of 08/08/10  . EKG     Hospital Course: Patient was admitted to [facility] and taken to the OR and underwent the above state procedure without complications.  Patient tolerated the procedure well and was later transferred to the recovery room and then to the orthopaedic floor for postoperative care.  They were given PO and IV analgesics for pain control following their surgery.  They were given 24 hours of postoperative antibiotics.   PT was consulted postop to assist with mobility and transfers.  The patient was allowed to be WBAT with therapy and was taught back precautions. Discharge planning was consulted to help with postop disposition and equipment needs.  Patient had a fair night on the evening of surgery and started to get up OOB with therapy on day one. Patient was seen in rounds daily and was  ready to go home on day two.  They were given discharge instructions and dressing directions.  They were instructed on when to follow up in the office with Dr. Rolena Infante.   Diet: Regular diet Activity:WBAT; lumbar brace; lumbar fusion precautions Follow-up:in 10-14 days Disposition - Home Discharged Condition: good   Discharge Instructions    Call MD / Call 911    Complete by:  As directed   If you experience chest pain or shortness of breath, CALL 911 and be transported to the hospital emergency room.  If you develope a fever above 101 F, pus (white drainage) or increased drainage or redness at the wound, or calf pain, call your surgeon's office.   Constipation Prevention    Complete by:  As directed   Drink plenty of fluids.  Prune juice may be helpful.  You may use a stool softener, such as Colace (over the counter) 100 mg twice a day.  Use MiraLax (over the counter) for constipation as needed.   Diet - low sodium heart healthy    Complete by:  As directed   Increase activity slowly as tolerated    Complete by:  As directed       Medication List    TAKE these medications   acetaminophen 500 MG tablet Commonly known as:  TYLENOL Take 1,000 mg by mouth every 6 (six) hours as needed.   aspirin 325 MG tablet Take  325 mg by mouth daily.   loratadine 10 MG tablet Commonly known as:  CLARITIN Take 10 mg by mouth daily.   methocarbamol 500 MG tablet Commonly known as:  ROBAXIN Take 1 tablet (500 mg total) by mouth 3 (three) times daily as needed for muscle spasms.   omeprazole 40 MG capsule Commonly known as:  PRILOSEC Take 40 mg by mouth daily.   ondansetron 4 MG tablet Commonly known as:  ZOFRAN Take 1 tablet (4 mg total) by mouth every 8 (eight) hours as needed for nausea or vomiting.   oxyCODONE-acetaminophen 10-325 MG tablet Commonly known as:  PERCOCET Take 1 tablet by mouth every 4 (four) hours as needed for pain.   pregabalin 50 MG capsule Commonly known as:   LYRICA Take 50 mg by mouth daily.      Follow-up Information    Dahlia Bailiff, MD. Schedule an appointment as soon as possible for a visit in 2 weeks.   Specialty:  Orthopedic Surgery Why:  For suture removal, If symptoms worsen, For wound re-check Contact information: [address] [place] [address] [phone]           Signed: Lacie Draft, PA-C Orthopaedic Surgery 11/11/2015, 7:38 AM

## 2015-11-11 NOTE — Anesthesia Postprocedure Evaluation (Signed)
Anesthesia Post Note  Patient: Elizabeth Crawford  Procedure(s) Performed: Procedure(s) (LRB): DECOMPRESSION L2-5 REMOVAL PEDICULE SCREWS L4-5 (N/A) HARDWARE REMOVAL (N/A)  Patient location during evaluation: PACU Anesthesia Type: General Level of consciousness: sedated and patient cooperative Pain management: pain level controlled Vital Signs Assessment: post-procedure vital signs reviewed and stable Respiratory status: spontaneous breathing Cardiovascular status: stable Anesthetic complications: no    Last Vitals:  Vitals:   11/11/15 0550 11/11/15 1029  BP: (!) 103/58 (!) 112/55  Pulse: 89 92  Resp: 18 20  Temp: 37.2 C 37.2 C    Last Pain:  Vitals:   11/11/15 1029  TempSrc: Oral  PainSc:                  Nolon Nations

## 2015-11-11 NOTE — Progress Notes (Signed)
    Subjective: Procedure(s) (LRB): DECOMPRESSION L2-5 REMOVAL PEDICULE SCREWS L4-5 (N/A) HARDWARE REMOVAL (N/A) 2 Days Post-Op  Patient reports pain as 3 on 0-10 scale.  Reports decreased leg pain reports incisional back pain   Positive void Negative bowel movement Negative flatus Negative chest pain or shortness of breath  Objective: Vital signs in last 24 hours: Temp:  [98.4 F (36.9 C)-100.2 F (37.9 C)] 98.9 F (37.2 C) (08/19 0550) Pulse Rate:  [89-105] 89 (08/19 0550) Resp:  [17-18] 18 (08/19 0550) BP: (91-109)/(52-75) 103/58 (08/19 0550) SpO2:  [94 %-97 %] 94 % (08/19 0550)  Intake/Output from previous day: 08/18 0701 - 08/19 0700 In: 832.2 [P.O.:240; I.V.:442.2; IV Piggyback:150] Out: 1155 [Urine:950; Drains:205]  Labs: No results for input(s): WBC, RBC, HCT, PLT in the last 72 hours. No results for input(s): NA, K, CL, CO2, BUN, CREATININE, GLUCOSE, CALCIUM in the last 72 hours. No results for input(s): LABPT, INR in the last 72 hours.  Physical Exam: Neurologically intact ABD soft Intact pulses distally Incision: dressing C/D/I Compartment soft  Assessment/Plan: Patient stable  xrays n/a Continue mobilization with physical therapy Continue care  Advance diet Up with therapy  Plan on d/c today of positive flatus/BM Mag citrate/Suppository/Amitiza for bowel prep  Melina Schools, MD [place] [phone]

## 2015-11-11 NOTE — Progress Notes (Signed)
Physical Therapy Treatment Patient Details Name: Elizabeth Crawford MRN: [id number] DOB: [birth year] Today's Date: 11/11/2015    History of Present Illness Patient is a 70 y/o female with hx of PE and DVT presents s/p L3-L5 lumbar decompression with removal of pedicle and L2-L5 decompression with removal of pedicle screw construct at L4-5.     PT Comments    Patient progressing well towards PT goals. Tolerated stair training today with Min guard assist for safety. Pt only has 1 rail at home but needing to grab 2 for support/comfort. Instructed pt to have spouse be other rail when negotiating steps. Reviewed back precautions as pt able to recall 2/3. Pt has not had a BM yet so awaiting that prior to d/c. Will follow.   Follow Up Recommendations  No PT follow up;Supervision - Intermittent     Equipment Recommendations  None recommended by PT    Recommendations for Other Services       Precautions / Restrictions Precautions Precautions: Back Precaution Booklet Issued: No Precaution Comments: Reviewed back precautions. Required Braces or Orthoses: Spinal Brace Spinal Brace: Lumbar corset Restrictions Weight Bearing Restrictions: No    Mobility  Bed Mobility Overal bed mobility: Needs Assistance Bed Mobility: Rolling;Sidelying to Sit Rolling: Min guard Sidelying to sit: Min guard       General bed mobility comments: HOB flat, no use of rails to simulate home. Pt going to help pt but she is able to perform on her own. Good demo of log roll technique.  Transfers Overall transfer level: Needs assistance Equipment used: None Transfers: Sit to/from Stand Sit to Stand: Supervision         General transfer comment: Supervision for safety.   Ambulation/Gait Ambulation/Gait assistance: Supervision Ambulation Distance (Feet): 150 Feet Assistive device: Rolling walker (2 wheeled) Gait Pattern/deviations: Step-through pattern;Decreased stride length Gait velocity:  decreased Gait velocity interpretation: Below normal speed for age/gender General Gait Details: Slow, steady gait. Use of RW. Reports feeling stronger in LEs today.   Stairs Stairs: Yes Stairs assistance: Min guard Stair Management: Two rails;Step to pattern Number of Stairs: 13 General stair comments: Cues for technique/safety. Pt only has 1 rail at home but wanting to use 2 for comfort.   Wheelchair Mobility    Modified Rankin (Stroke Patients Only)       Balance Overall balance assessment: Needs assistance Sitting-balance support: Feet supported;No upper extremity supported Sitting balance-Leahy Scale: Good Sitting balance - Comments: Able to donn brace with assist from spouse.   Standing balance support: During functional activity Standing balance-Leahy Scale: Fair                      Cognition Arousal/Alertness: Awake/alert Behavior During Therapy: WFL for tasks assessed/performed Overall Cognitive Status: Within Functional Limits for tasks assessed                      Exercises      General Comments General comments (skin integrity, edema, etc.): Spouse present during session. Wanting to assist with all mobility.       Pertinent Vitals/Pain Pain Assessment: Faces Faces Pain Scale: Hurts a little bit Pain Location: back Pain Descriptors / Indicators: Sore Pain Intervention(s): Monitored during session;Repositioned    Home Living                      Prior Function            PT Goals (current goals can now  be found in the care plan section) Progress towards PT goals: Progressing toward goals    Frequency  Min 5X/week    PT Plan Current plan remains appropriate    Co-evaluation             End of Session Equipment Utilized During Treatment: Back brace Activity Tolerance: Patient tolerated treatment well Patient left: in bed;with call bell/phone within reach;with family/visitor present     Time: [id number] PT  Time Calculation (min) (ACUTE ONLY): 21 min  Charges:  $Gait Training: 8-22 mins                    G Codes:      Mavery Milling A Trey Gulbranson 11/11/2015, 7:40 AM Wray Kearns, [place], DPT [phone]

## 2015-12-19 ENCOUNTER — Inpatient Hospital Stay (HOSPITAL_COMMUNITY): Admission: RE | Admit: 2015-12-19 | Payer: Medicare Other | Source: Ambulatory Visit

## 2015-12-19 ENCOUNTER — Ambulatory Visit (HOSPITAL_COMMUNITY)
Admission: RE | Admit: 2015-12-19 | Discharge: 2015-12-19 | Disposition: A | Discharge status: Home / Self Care | Payer: Medicare Other | Source: Ambulatory Visit | Attending: Cardiovascular Disease | Admitting: Cardiovascular Disease

## 2015-12-19 ENCOUNTER — Other Ambulatory Visit (HOSPITAL_COMMUNITY): Payer: Self-pay | Admitting: Orthopedic Surgery

## 2015-12-19 DIAGNOSIS — R52 Pain, unspecified: Secondary | ICD-10-CM

## 2015-12-19 DIAGNOSIS — M7989 Other specified soft tissue disorders: Secondary | ICD-10-CM | POA: Diagnosis not present

## 2015-12-19 DIAGNOSIS — I82532 Chronic embolism and thrombosis of left popliteal vein: Secondary | ICD-10-CM | POA: Insufficient documentation

## 2016-01-02 ENCOUNTER — Telehealth (INDEPENDENT_AMBULATORY_CARE_PROVIDER_SITE_OTHER): Payer: Self-pay | Admitting: Vascular Surgery

## 2016-01-02 NOTE — Telephone Encounter (Signed)
Pt is having left leg swelling and need to speak to nurse. She had DVT study done last month but came back negative. [phone]

## 2016-01-03 NOTE — Telephone Encounter (Signed)
The patient is welcome to come in and have an ultrasound done to rule out DVT. If there is a wait due to scheduling problems she may want to touch base with her PCP or go to the ED - especially if she is having any chest pain or shortness of breath. If her DVT study is negative she should start wearing compression stockings and elevating her legs on a daily basis as we have directed her in the past. If her DVT study is negative she is probably experiencing swelling from venous insufficiency and/or lymphedema.

## 2016-01-03 NOTE — Telephone Encounter (Signed)
Spoke with pt and she didn't want to have another DVT study. Pt stated she hasn't been wearing her stocking because it was hot but had been elevating her legs. I advise the pt as directed by the message from [place].

## 2016-06-27 DIAGNOSIS — N949 Unspecified condition associated with female genital organs and menstrual cycle: Secondary | ICD-10-CM | POA: Insufficient documentation

## 2016-06-27 DIAGNOSIS — N941 Unspecified dyspareunia: Secondary | ICD-10-CM | POA: Insufficient documentation

## 2016-07-01 ENCOUNTER — Ambulatory Visit (INDEPENDENT_AMBULATORY_CARE_PROVIDER_SITE_OTHER): Payer: Self-pay | Admitting: Vascular Surgery

## 2016-07-01 ENCOUNTER — Encounter (INDEPENDENT_AMBULATORY_CARE_PROVIDER_SITE_OTHER): Payer: Self-pay

## 2016-08-26 ENCOUNTER — Ambulatory Visit (INDEPENDENT_AMBULATORY_CARE_PROVIDER_SITE_OTHER): Payer: Medicare Other | Admitting: Vascular Surgery

## 2016-08-26 ENCOUNTER — Ambulatory Visit (INDEPENDENT_AMBULATORY_CARE_PROVIDER_SITE_OTHER): Payer: Medicare Other

## 2016-08-26 ENCOUNTER — Encounter (INDEPENDENT_AMBULATORY_CARE_PROVIDER_SITE_OTHER): Payer: Self-pay | Admitting: Vascular Surgery

## 2016-08-26 ENCOUNTER — Other Ambulatory Visit (INDEPENDENT_AMBULATORY_CARE_PROVIDER_SITE_OTHER): Payer: Self-pay | Admitting: Vascular Surgery

## 2016-08-26 DIAGNOSIS — M7989 Other specified soft tissue disorders: Secondary | ICD-10-CM

## 2016-08-26 DIAGNOSIS — I872 Venous insufficiency (chronic) (peripheral): Secondary | ICD-10-CM | POA: Diagnosis not present

## 2016-08-26 DIAGNOSIS — Z86718 Personal history of other venous thrombosis and embolism: Secondary | ICD-10-CM

## 2016-08-26 DIAGNOSIS — Z95828 Presence of other vascular implants and grafts: Secondary | ICD-10-CM | POA: Diagnosis not present

## 2016-08-26 DIAGNOSIS — G629 Polyneuropathy, unspecified: Secondary | ICD-10-CM | POA: Insufficient documentation

## 2016-08-26 NOTE — Progress Notes (Signed)
MRN : [id number]  Elizabeth Crawford is a 71 y.o. ([birth year]) female who presents with chief complaint of  Chief Complaint  Patient presents with  . Re-evaluation    1 year IVC follow up  .  History of Present Illness:  The patient presents to the office for evaluation of DVT.  DVT was identified at [facility] by Duplex ultrasound remotely.  The initial symptoms were pain and swelling in the lower extremity.  At that time she had a filter placed at that time. Several months later attempts at removing the filter were unsuccessful. Since then we have been monitoring the patency of the inferior vena cava.  The patient notes her legs are mild to moderately painful with dependency and swell.  Symptoms are much better with elevation.  The patient notes minimal edema in the morning which steadily worsens throughout the day.    The patient has not been using compression therapy at this point.  No SOB or pleuritic chest pains.  No cough or hemoptysis.  No blood per rectum or blood in any sputum.  No excessive bruising per the patient.    Duplex ultrasound of the inferior vena cava shows the filter is patent and normal flow within the cava   Current Meds  Medication Sig  . acetaminophen (TYLENOL) 500 MG tablet Take 1,000 mg by mouth every 6 (six) hours as needed.  Marland Kitchen aspirin 325 MG tablet Take 325 mg by mouth daily.  . Estradiol 10 MCG TABS vaginal tablet Place vaginally.  Marland Kitchen loratadine (CLARITIN) 10 MG tablet Take 10 mg by mouth daily.  Marland Kitchen omeprazole (PRILOSEC) 40 MG capsule Take 40 mg by mouth daily.    Past Medical History:  Diagnosis Date  . Allergy   . Arthritis   . Complication of anesthesia   . DVT (deep venous thrombosis) ([place])   . GERD (gastroesophageal reflux disease)   . H/O seasonal allergies   . PONV (postoperative nausea and vomiting)    Gets sick every time  . Pulmonary embolism ([place])   . Spondylolisthesis     Past Surgical History:  Procedure Laterality Date  .  BLADDER SUSPENSION  2009  . CARPAL TUNNEL RELEASE  2003   Left hand  . CHOLECYSTECTOMY  2000   Laparoscopic  . COLONOSCOPY W/ POLYPECTOMY  2014  . DECOMPRESSIVE LUMBAR LAMINECTOMY LEVEL 2 N/A 11/09/2015   Procedure: DECOMPRESSION L2-5 REMOVAL PEDICULE SCREWS L4-5;  Surgeon: Melina Schools, MD;  Location: [place];  Service: Orthopedics;  Laterality: N/A;  . EYE SURGERY Bilateral 2005   cataract surgery   . HARDWARE REMOVAL N/A 11/09/2015   Procedure: HARDWARE REMOVAL;  Surgeon: Melina Schools, MD;  Location: [place];  Service: Orthopedics;  Laterality: N/A;  . [place]  . IVC filter placement  02-01-10  . SPINE SURGERY  5 23-12   anterolateral fusion/ rodding  . TONSILLECTOMY    . TUBAL LIGATION  1977    Social History Social History  Substance Use Topics  . Smoking status: Former Smoker    Years: 15.00    Types: Cigarettes    Quit date: 02/26/1976  . Smokeless tobacco: Former Systems developer  . Alcohol use No     Comment: occasionally    Family History Family History  Problem Relation Age of Onset  . COPD Mother   . Heart disease Mother   . Aortic aneurysm Mother   . Heart disease Father     Allergies  Allergen Reactions  .  Lidocaine Other (See Comments)    UTI (polyuria, dysuria) and facial flushing  . Sulfa Antibiotics Other (See Comments)    UNSPECIFIED   . Methylprednisolone Palpitations     REVIEW OF SYSTEMS (Negative unless checked)  Constitutional: _0 Weight loss  _1 Fever  _2 Chills Cardiac: _3 Chest pain   _4 Chest pressure   _5 Palpitations   _6 Shortness of breath when laying flat   _7 Shortness of breath with exertion. Vascular:  _8 Pain in legs with walking   _9 Pain in legs at rest  _10 History of DVT   _11 Phlebitis   _12 Swelling in legs   _13 Varicose veins   _14 Non-healing ulcers Pulmonary:   _15 Uses home oxygen   _16 Productive cough   _17 Hemoptysis   _18 Wheeze  _19 COPD   _20 Asthma Neurologic:  _21 Dizziness   _22 Seizures   _23 History of stroke   _24 History of TIA  _25 Aphasia    _26 Vissual changes   _27 Weakness or numbness in arm   _28 Weakness or numbness in leg Musculoskeletal:   _29 Joint swelling   _30 Joint pain   _31 Low back pain Hematologic:  _32 Easy bruising  _33 Easy bleeding   _34 Hypercoagulable state   _35 Anemic Gastrointestinal:  _36 Diarrhea   _37 Vomiting  _38 Gastroesophageal reflux/heartburn   _39 Difficulty swallowing. Genitourinary:  _40 Chronic kidney disease   _41 Difficult urination  _42 Frequent urination   _43 Blood in urine Skin:  _44 Rashes   _45 Ulcers  Psychological:  _46 History of anxiety   _47  History of major depression.  Physical Examination  Vitals:   08/26/16 0933  BP: (!) 141/89  Pulse: 64  Resp: 15  Weight: 193 lb (87.5 kg)  Height: _48  (1.626 m)   Body mass index is 33.13 kg/m. Gen: WD/WN, NAD Head: [place]/AT, No temporalis wasting.  Ear/Nose/Throat: Hearing grossly intact, nares w/o erythema or drainage Eyes: PER, EOMI, sclera nonicteric.  Neck: Supple, no large masses.   Pulmonary:  Good air movement, no audible wheezing bilaterally, no use of accessory muscles.  Cardiac: RRR, no JVD Vascular: 2+ soft edema and mild venous changes Vessel Right Left  PT Palpable Palpable  DP Palpable Palpable  Gastrointestinal: Non-distended. No guarding/no peritoneal signs.  Musculoskeletal: M/S 5/5 throughout.  No deformity or atrophy.  Neurologic: CN 2-12 intact. Symmetrical.  Speech is fluent. Motor exam as listed above. Psychiatric: Judgment intact, Mood & affect appropriate for pt's clinical situation. Dermatologic: No rashes or ulcers noted.  No changes consistent with cellulitis. Lymph : No lichenification or skin changes of chronic lymphedema.  CBC Lab Results  Component Value Date   WBC 7.5 08/08/2010   HGB 13.9 08/08/2010   HCT 41.2 08/08/2010   MCV 92.4 08/08/2010   PLT 189 08/08/2010    BMET    Component Value Date/Time   NA 140 08/08/2010 1112   K 4.2 08/08/2010 1112   CL 102 08/08/2010 1112   CO2 30 08/08/2010 1112   GLUCOSE 94  08/08/2010 1112   BUN 15 08/08/2010 1112   CREATININE 0.85 08/08/2010 1112   CALCIUM 10.3 08/08/2010 1112   GFRNONAA >60 08/08/2010 1112   GFRAA  08/08/2010 1112    >60        The eGFR has been calculated using the MDRD equation. This calculation has not been validated in all clinical situations. eGFR's persistently <60 mL/min signify possible Chronic Kidney Disease.   CrCl cannot be calculated (Patient's most recent lab result is older than the maximum 21 days allowed.).  COAG Lab Results  Component Value Date   INR 1.17 08/18/2010   INR 1.09 08/17/2010   INR 1.07 08/15/2010  Radiology No results found.  Assessment/Plan 1. History of DVT (deep vein thrombosis) Recommend:   No surgery or intervention at this point in time.  IVC filter is present and was unable to be retrieved..  Patient's duplex ultrasound of the venous system shows the IVC is widely patent and the filter is intact.  The patient has completed her anticoagulation   Elevation was stressed, use of a recliner was discussed.  I have had a long discussion with the patient regarding DVT and post phlebitic changes such as swelling and why it  causes symptoms such as pain.  The patient will wear graduated compression stockings class 1 (20-30 mmHg), beginning after three full days of anticoagulation, on a daily basis a prescription was given. The patient will  beginning wearing the stockings first thing in the morning and removing them in the evening. The patient is instructed specifically not to sleep in the stockings.  In addition, behavioral modification including elevation during the day and avoidance of prolonged dependency will be initiated.    The patient will continue anticoagulation for now as there have not been any problems or complications at this point.   - VAS US AORTA/IVC/ILIACS; Future  2. Chronic venous insufficiency No surgery or intervention at this point in time.    I have had a long  discussion with the patient regarding venous insufficiency and why it  causes symptoms. I have discussed with the patient the chronic skin changes that accompany venous insufficiency and the long term sequela such as infection and ulceration.  Patient will begin wearing graduated compression stockings class 1 (20-30 mmHg) or compression wraps on a daily basis a prescription was given. The patient will put the stockings on first thing in the morning and removing them in the evening. The patient is instructed specifically not to sleep in the stockings.    In addition, behavioral modification including several periods of elevation of the lower extremities during the day will be continued. I have demonstrated that proper elevation is a position with the ankles at heart level.  The patient is instructed to begin routine exercise, especially walking on a daily basis  Following the review of the ultrasound the patient will follow up in 2-3 months to reassess the degree of swelling and the control that graduated compression stockings or compression wraps  is offering.   The patient can be assessed for a Lymph Pump at that time - VAS US AORTA/IVC/ILIACS; Future  3. Leg swelling See #2  4. Neuropathy Continue medications as already ordered, these medications have been reviewed and there are no changes at this time.     Hortencia Pilar, MD  08/26/2016 12:54 PM

## 2017-02-24 ENCOUNTER — Ambulatory Visit (INDEPENDENT_AMBULATORY_CARE_PROVIDER_SITE_OTHER): Payer: Medicare Other | Admitting: Urology

## 2017-02-24 ENCOUNTER — Encounter: Payer: Self-pay | Admitting: Urology

## 2017-02-24 VITALS — BP 147/90 | HR 80 | Ht 64.0 in | Wt 192.7 lb

## 2017-02-24 DIAGNOSIS — N390 Urinary tract infection, site not specified: Secondary | ICD-10-CM | POA: Diagnosis not present

## 2017-02-24 LAB — MICROSCOPIC EXAMINATION
Bacteria, UA: NONE SEEN
Epithelial Cells (non renal): NONE SEEN /hpf (ref 0–10)
RBC, UA: NONE SEEN /hpf (ref 0–?)

## 2017-02-24 LAB — URINALYSIS, COMPLETE
Bilirubin, UA: NEGATIVE
Glucose, UA: NEGATIVE
Ketones, UA: NEGATIVE
Nitrite, UA: NEGATIVE
Protein, UA: NEGATIVE
RBC, UA: NEGATIVE
Specific Gravity, UA: 1.02 (ref 1.005–1.030)
Urobilinogen, Ur: 0.2 mg/dL (ref 0.2–1.0)
pH, UA: 6 (ref 5.0–7.5)

## 2017-02-24 LAB — BLADDER SCAN AMB NON-IMAGING: Scan Result: 17

## 2017-02-24 NOTE — Progress Notes (Signed)
02/24/2017 9:44 AM     Elizabeth Crawford Jul 28, 1945 [id number]  Referring provider: Maryland Pink, MD [address] [facility] [place], [place] [address]  Chief Complaint  Patient presents with  . Recurrent UTI    HPI: I was consulted to assess the patient's recent urinary tract infection that presented with difficulty to urinate and lower abdominal pain that was significant and difficult to clear.  She was given ciprofloxacin and then Macrobid that she is currently taking twice a day and she describes perhaps a resistant culture.  She has had approximately 3 infections this year when she typically gets 1 or less per year  She had a bladder suspension years ago.  She has had 2 lower back operations.  She rarely leaks and does not wear a pad  She voids every 2-4 hours and sometimes gets up once a night to urinate.  Bowel movements are normal.  Modifying factors: There are no other modifying factors  Associated signs and symptoms: There are no other associated signs and symptoms Aggravating and relieving factors: There are no other aggravating or relieving factors Severity: Moderate Duration: Persistent   PMH: Past Medical History:  Diagnosis Date  . Allergy   . Arthritis   . Complication of anesthesia   . DVT (deep venous thrombosis) ([place])   . GERD (gastroesophageal reflux disease)   . H/O seasonal allergies   . PONV (postoperative nausea and vomiting)    Gets sick every time  . Pulmonary embolism ([place])   . Spondylolisthesis     Surgical History: Past Surgical History:  Procedure Laterality Date  . BLADDER SUSPENSION  2009  . CARPAL TUNNEL RELEASE  2003   Left hand  . CHOLECYSTECTOMY  2000   Laparoscopic  . COLONOSCOPY W/ POLYPECTOMY  2014  . DECOMPRESSIVE LUMBAR LAMINECTOMY LEVEL 2 N/A 11/09/2015   Procedure: DECOMPRESSION L2-5 REMOVAL PEDICULE SCREWS L4-5;  Surgeon: Melina Schools, MD;  Location: [place];  Service: Orthopedics;  Laterality: N/A;  . EYE  SURGERY Bilateral 2005   cataract surgery   . HARDWARE REMOVAL N/A 11/09/2015   Procedure: HARDWARE REMOVAL;  Surgeon: Melina Schools, MD;  Location: [place];  Service: Orthopedics;  Laterality: N/A;  . [place]  . IVC filter placement  02-01-10  . SPINE SURGERY  5 23-12   anterolateral fusion/ rodding  . TONSILLECTOMY    . TUBAL LIGATION  1977    Home Medications:  Allergies as of 02/24/2017      Reactions   Lidocaine Other (See Comments)   UTI (polyuria, dysuria) and facial flushing   Sulfa Antibiotics Other (See Comments)   UNSPECIFIED   Methylprednisolone Palpitations      Medication List        Accurate as of 02/24/17  9:44 AM. Always use your most recent med list.          acetaminophen 500 MG tablet Commonly known as:  TYLENOL Take 1,000 mg by mouth every 6 (six) hours as needed.   aspirin 325 MG tablet Take 325 mg by mouth daily.   Estradiol 10 MCG Tabs vaginal tablet Place vaginally.   loratadine 10 MG tablet Commonly known as:  CLARITIN Take 10 mg by mouth daily.   nitrofurantoin (macrocrystal-monohydrate) 100 MG capsule Commonly known as:  MACROBID Take 100 mg by mouth 2 (two) times daily.   omeprazole 40 MG capsule Commonly known as:  PRILOSEC Take 40 mg by mouth daily.  Allergies:  Allergies  Allergen Reactions  . Lidocaine Other (See Comments)    UTI (polyuria, dysuria) and facial flushing  . Sulfa Antibiotics Other (See Comments)    UNSPECIFIED   . Methylprednisolone Palpitations    Family History: Family History  Problem Relation Age of Onset  . COPD Mother   . Heart disease Mother   . Aortic aneurysm Mother   . Heart disease Father     Social History:  reports that she quit smoking about 41 years ago. Her smoking use included cigarettes. She quit after 15.00 years of use. She has quit using smokeless tobacco. She reports that she does not drink alcohol or use drugs.  ROS: UROLOGY Frequent Urination?:  No Hard to postpone urination?: No Burning/pain with urination?: Yes Get up at night to urinate?: No Leakage of urine?: No Urine stream starts and stops?: No Trouble starting stream?: No Do you have to strain to urinate?: Yes Blood in urine?: Yes Urinary tract infection?: Yes Sexually transmitted disease?: No Injury to kidneys or bladder?: No Painful intercourse?: No Weak stream?: No Currently pregnant?: No Vaginal bleeding?: No Last menstrual period?: n  Gastrointestinal Nausea?: Yes Vomiting?: No Indigestion/heartburn?: No Diarrhea?: Yes Constipation?: No  Constitutional Fever: No Night sweats?: No Weight loss?: No Fatigue?: Yes  Skin Skin rash/lesions?: No Itching?: Yes  Eyes Blurred vision?: No Double vision?: No  Ears/Nose/Throat Sore throat?: No Sinus problems?: No  Hematologic/Lymphatic Swollen glands?: No Easy bruising?: No  Cardiovascular Leg swelling?: No Chest pain?: No  Respiratory Cough?: Yes Shortness of breath?: Yes  Endocrine Excessive thirst?: No  Musculoskeletal Back pain?: Yes Joint pain?: Yes  Neurological Headaches?: No Dizziness?: No  Psychologic Depression?: No Anxiety?: No  Physical Exam: BP (!) 147/90 (BP Location: Right Arm, Patient Position: Sitting, Cuff Size: Normal)   Pulse 80   Ht 5\' 4"  (1.626 m)   Wt 192 lb 11.2 oz (87.4 kg)   BMI 33.08 kg/m   Constitutional:  Alert and oriented, No acute distress. HEENT: [place] AT, moist mucus membranes.  Trachea midline, no masses. Cardiovascular: No clubbing, cyanosis, or edema. Respiratory: Normal respiratory effort, no increased work of breathing. GI: Abdomen is soft, nontender, nondistended, no abdominal masses GU: On pelvic examination the patient had a grade 2 asymptomatic cystocele with moderate central defect and no stress incontinence.  She had no rectocele and I could not feel or see any sling extrusion Skin: No rashes, bruises or suspicious lesions. Lymph: No  cervical or inguinal adenopathy. Neurologic: Grossly intact, no focal deficits, moving all 4 extremities. Psychiatric: Normal mood and affect.  Laboratory Data: Lab Results  Component Value Date   WBC 7.5 08/08/2010   HGB 13.9 08/08/2010   HCT 41.2 08/08/2010   MCV 92.4 08/08/2010   PLT 189 08/08/2010    Lab Results  Component Value Date   CREATININE 0.85 08/08/2010     Urinalysis No results found for: COLORURINE, APPEARANCEUR, LABSPEC, PHURINE, GLUCOSEU, HGBUR, BILIRUBINUR, KETONESUR, PROTEINUR, UROBILINOGEN, NITRITE, LEUKOCYTESUR  Pertinent Imaging: none  Assessment & Plan: The patient describes recurrent bladder infections and a change in her pattern.  She has minimal voiding dysfunction and uncommon incontinence and sometimes mild nocturia.  She has had a previous bladder suspension.  The pathophysiology and workup described.  I did not see a positive culture in the medical record. Culture sent  Patient will return with renal ultrasound.  I will perform cystoscopy on that day   There are no diagnoses linked to this encounter.  No Follow-up  on file.  Reece Packer, MD  [facility] [address] [place], [place] [address] [phone]

## 2017-02-26 LAB — CULTURE, URINE COMPREHENSIVE

## 2017-03-10 ENCOUNTER — Other Ambulatory Visit: Payer: Medicare Other

## 2017-03-20 ENCOUNTER — Ambulatory Visit: Payer: Medicare Other

## 2017-04-07 ENCOUNTER — Other Ambulatory Visit: Payer: Medicare Other

## 2017-05-21 ENCOUNTER — Encounter: Payer: Self-pay | Admitting: *Deleted

## 2017-05-22 ENCOUNTER — Ambulatory Visit
Admission: RE | Admit: 2017-05-22 | Discharge: 2017-05-22 | Disposition: A | Discharge status: Home / Self Care | Payer: Medicare Other | Source: Ambulatory Visit | Attending: Gastroenterology | Admitting: Gastroenterology

## 2017-05-22 ENCOUNTER — Ambulatory Visit: Payer: Medicare Other | Admitting: Certified Registered Nurse Anesthetist

## 2017-05-22 ENCOUNTER — Encounter
Admission: RE | Disposition: A | Discharge status: Home / Self Care | Payer: Self-pay | Source: Ambulatory Visit | Attending: Gastroenterology

## 2017-05-22 ENCOUNTER — Encounter: Payer: Self-pay | Admitting: Certified Registered Nurse Anesthetist

## 2017-05-22 DIAGNOSIS — Z7982 Long term (current) use of aspirin: Secondary | ICD-10-CM | POA: Diagnosis not present

## 2017-05-22 DIAGNOSIS — Z79899 Other long term (current) drug therapy: Secondary | ICD-10-CM | POA: Diagnosis not present

## 2017-05-22 DIAGNOSIS — Z886 Allergy status to analgesic agent status: Secondary | ICD-10-CM | POA: Diagnosis not present

## 2017-05-22 DIAGNOSIS — Z882 Allergy status to sulfonamides status: Secondary | ICD-10-CM | POA: Insufficient documentation

## 2017-05-22 DIAGNOSIS — K635 Polyp of colon: Secondary | ICD-10-CM | POA: Diagnosis not present

## 2017-05-22 DIAGNOSIS — Z888 Allergy status to other drugs, medicaments and biological substances status: Secondary | ICD-10-CM | POA: Insufficient documentation

## 2017-05-22 DIAGNOSIS — M1990 Unspecified osteoarthritis, unspecified site: Secondary | ICD-10-CM | POA: Diagnosis not present

## 2017-05-22 DIAGNOSIS — Z86711 Personal history of pulmonary embolism: Secondary | ICD-10-CM | POA: Insufficient documentation

## 2017-05-22 DIAGNOSIS — Z86010 Personal history of colonic polyps: Secondary | ICD-10-CM | POA: Diagnosis not present

## 2017-05-22 DIAGNOSIS — K219 Gastro-esophageal reflux disease without esophagitis: Secondary | ICD-10-CM | POA: Insufficient documentation

## 2017-05-22 DIAGNOSIS — Z86718 Personal history of other venous thrombosis and embolism: Secondary | ICD-10-CM | POA: Insufficient documentation

## 2017-05-22 DIAGNOSIS — Z1211 Encounter for screening for malignant neoplasm of colon: Secondary | ICD-10-CM | POA: Insufficient documentation

## 2017-05-22 DIAGNOSIS — K5730 Diverticulosis of large intestine without perforation or abscess without bleeding: Secondary | ICD-10-CM | POA: Diagnosis not present

## 2017-05-22 DIAGNOSIS — Z87891 Personal history of nicotine dependence: Secondary | ICD-10-CM | POA: Insufficient documentation

## 2017-05-22 HISTORY — PX: COLONOSCOPY WITH PROPOFOL: SHX5780

## 2017-05-22 SURGERY — COLONOSCOPY WITH PROPOFOL
Anesthesia: General

## 2017-05-22 MED ORDER — PROPOFOL 500 MG/50ML IV EMUL
INTRAVENOUS | Status: AC
  Filled 2017-05-22: qty 50

## 2017-05-22 MED ORDER — LIDOCAINE HCL (CARDIAC) 20 MG/ML IV SOLN
INTRAVENOUS | Status: DC | PRN
  Administered 2017-05-22: 50 mg via INTRAVENOUS

## 2017-05-22 MED ORDER — SODIUM CHLORIDE 0.9 % IV SOLN
INTRAVENOUS | Status: DC
  Administered 2017-05-22: 1000 mL via INTRAVENOUS

## 2017-05-22 MED ORDER — SODIUM CHLORIDE 0.9 % IV SOLN: INTRAVENOUS | Status: DC

## 2017-05-22 MED ORDER — LIDOCAINE HCL (PF) 2 % IJ SOLN
INTRAMUSCULAR | Status: AC
  Filled 2017-05-22: qty 10

## 2017-05-22 MED ORDER — PROPOFOL 500 MG/50ML IV EMUL
INTRAVENOUS | Status: DC | PRN
  Administered 2017-05-22: 140 ug/kg/min via INTRAVENOUS

## 2017-05-22 MED ORDER — PROPOFOL 10 MG/ML IV BOLUS
INTRAVENOUS | Status: DC | PRN
  Administered 2017-05-22: 100 mg via INTRAVENOUS

## 2017-05-22 NOTE — Op Note (Signed)
[facility] Gastroenterology Patient Name: Elizabeth Crawford Procedure Date: 05/22/2017 9:17 AM MRN: [id number] Account #: [id number] Date of Birth: [birth year] Admit Type: Outpatient Age: 72 Room: [facility] ENDO ROOM 1 Gender: Female Note Status: Finalized Procedure:            Colonoscopy Indications:          Personal history of colonic polyps Providers:            Lollie Sails, MD Referring MD:         Irven Easterly. Kary Kos, MD (Referring MD) Medicines:            Monitored Anesthesia Care Complications:        No immediate complications. Procedure:            Pre-Anesthesia Assessment:                       - ASA Grade Assessment: II - A patient with mild                        systemic disease.                       After obtaining informed consent, the colonoscope was                        passed under direct vision. Throughout the procedure,                        the patient's blood pressure, pulse, and oxygen                        saturations were monitored continuously. The                        Colonoscope was introduced through the anus and                        advanced to the the cecum, identified by appendiceal                        orifice and ileocecal valve. The patient tolerated the                        procedure well. The quality of the bowel preparation                        was good. Findings:      Three sessile polyps were found in the recto-sigmoid colon. The polyps       were 1 to 2 mm in size. These polyps were removed with a cold biopsy       forceps. Resection and retrieval were complete.      Multiple small-mouthed diverticula were found in the sigmoid colon and       descending colon.      The digital rectal exam was normal. Impression:           - Three 1 to 2 mm polyps at the recto-sigmoid colon,                        removed with a cold biopsy forceps. Resected and  retrieved.                       -  Diverticulosis in the sigmoid colon and in the                        descending colon. Recommendation:       - Discharge patient to home.                       - Await pathology results.                       - Telephone GI clinic for pathology results in 1 week. Procedure Code(s):    --- Professional ---                       [phone], Colonoscopy, flexible; with biopsy, single or                        multiple Diagnosis Code(s):    --- Professional ---                       D12.7, Benign neoplasm of rectosigmoid junction                       Z86.010, Personal history of colonic polyps                       K57.30, Diverticulosis of large intestine without                        perforation or abscess without bleeding CPT copyright 2016 American Medical Association. All rights reserved. The codes documented in this report are preliminary and upon coder review may  be revised to meet current compliance requirements. Lollie Sails, MD 05/22/2017 9:56:25 AM This report has been signed electronically. Number of Addenda: 0 Note Initiated On: 05/22/2017 9:17 AM Scope Withdrawal Time: 0 hours 12 minutes 10 seconds  Total Procedure Duration: 0 hours 27 minutes 55 seconds       [facility]

## 2017-05-22 NOTE — Anesthesia Post-op Follow-up Note (Signed)
Anesthesia QCDR form completed.        

## 2017-05-22 NOTE — Transfer of Care (Signed)
Immediate Anesthesia Transfer of Care Note  Patient: NOVALI VOLLMAN  Procedure(s) Performed: COLONOSCOPY WITH PROPOFOL (N/A )  Patient Location: PACU and Endoscopy Unit  Anesthesia Type:General  Level of Consciousness: drowsy  Airway & Oxygen Therapy: Patient Spontanous Breathing and Patient connected to nasal cannula oxygen  Post-op Assessment: Report given to RN and Post -op Vital signs reviewed and stable  Post vital signs: Reviewed and stable  Last Vitals:  Vitals:   05/22/17 0848 05/22/17 0958  BP: 129/86 (!) (P) 81/48  Pulse: 91   Resp: 20   Temp: (!) 36.1 C (P) 36.4 C  SpO2: 96%     Last Pain:  Vitals:   05/22/17 0958  TempSrc: (P) Tympanic         Complications: No apparent anesthesia complications

## 2017-05-22 NOTE — Anesthesia Postprocedure Evaluation (Signed)
Anesthesia Post Note  Patient: Elizabeth Crawford  Procedure(s) Performed: COLONOSCOPY WITH PROPOFOL (N/A )  Patient location during evaluation: PACU Anesthesia Type: General Level of consciousness: awake and alert Pain management: pain level controlled Vital Signs Assessment: post-procedure vital signs reviewed and stable Respiratory status: spontaneous breathing, nonlabored ventilation, respiratory function stable and patient connected to nasal cannula oxygen Cardiovascular status: blood pressure returned to baseline and stable Postop Assessment: no apparent nausea or vomiting Anesthetic complications: no     Last Vitals:  Vitals:   05/22/17 0958 05/22/17 1002  BP: (!) 81/48 90/70  Pulse:    Resp:    Temp: 36.4 C   SpO2:      Last Pain:  Vitals:   05/22/17 0958  TempSrc: Tympanic  PainSc: [place]

## 2017-05-22 NOTE — Anesthesia Preprocedure Evaluation (Signed)
Anesthesia Evaluation  Patient identified by MRN, date of birth, ID band Patient awake    Reviewed: Allergy & Precautions, H&P , NPO status , Patient's Chart, lab work & pertinent test results, reviewed documented beta blocker date and time   History of Anesthesia Complications (+) PONV and history of anesthetic complications  Airway Mallampati: II   Neck ROM: full    Dental  (+) Poor Dentition, Teeth Intact   Pulmonary neg pulmonary ROS, former smoker,    Pulmonary exam normal        Cardiovascular negative cardio ROS Normal cardiovascular exam Rhythm:regular Rate:Normal     Neuro/Psych negative neurological ROS  negative psych ROS   GI/Hepatic negative GI ROS, Neg liver ROS, GERD  Medicated,  Endo/Other  negative endocrine ROS  Renal/GU negative Renal ROS  negative genitourinary   Musculoskeletal   Abdominal   Peds  Hematology negative hematology ROS (+)   Anesthesia Other Findings Past Medical History: No date: Allergy No date: Arthritis No date: Complication of anesthesia No date: DVT (deep venous thrombosis) (HCC) No date: GERD (gastroesophageal reflux disease) No date: H/O seasonal allergies No date: PONV (postoperative nausea and vomiting)     Comment:  Gets sick every time No date: Pulmonary embolism ([place]) No date: Spondylolisthesis Past Surgical History: 2009: BLADDER SUSPENSION 2003: CARPAL TUNNEL RELEASE     Comment:  Left hand 2000: CHOLECYSTECTOMY     Comment:  Laparoscopic 2014: COLONOSCOPY W/ POLYPECTOMY 11/09/2015: DECOMPRESSIVE LUMBAR LAMINECTOMY LEVEL 2; N/A     Comment:  Procedure: DECOMPRESSION L2-5 REMOVAL PEDICULE SCREWS               L4-5;  Surgeon: Melina Schools, MD;  Location: [place];                Service: Orthopedics;  Laterality: N/A; 2005: EYE SURGERY; Bilateral     Comment:  cataract surgery  11/09/2015: HARDWARE REMOVAL; N/A     Comment:  Procedure: HARDWARE REMOVAL;   Surgeon: Melina Schools,               MD;  Location: [place];  Service: Orthopedics;  Laterality:              N/A; 1978: HEMORRHOID SURGERY 02-01-10: IVC filter placement 5 23-12: SPINE SURGERY     Comment:  anterolateral fusion/ rodding No date: TONSILLECTOMY 1977: TUBAL LIGATION BMI    Body Mass Index:  32.96 kg/m     Reproductive/Obstetrics negative OB ROS                             Anesthesia Physical Anesthesia Plan  ASA: III  Anesthesia Plan: General   Post-op Pain Management:    Induction:   PONV Risk Score and Plan:   Airway Management Planned:   Additional Equipment:   Intra-op Plan:   Post-operative Plan:   Informed Consent: I have reviewed the patients History and Physical, chart, labs and discussed the procedure including the risks, benefits and alternatives for the proposed anesthesia with the patient or authorized representative who has indicated his/her understanding and acceptance.   Dental Advisory Given  Plan Discussed with: CRNA  Anesthesia Plan Comments:         Anesthesia Quick Evaluation

## 2017-05-22 NOTE — H&P (Signed)
Outpatient short stay form Pre-procedure 05/22/2017 9:19 AM Lollie Sails MD  Primary Physician: Dr. [place] Pink  Reason for visit: Colonoscopy  History of present illness: Patient is a 72 year old female presenting today as above.  She has a personal history of adenomatous colon polyps with her last colonoscopy being in 2014.  She tolerated her prep well.  She takes 325 mg aspirin that is been held for several days.  Takes no other aspirin products or blood thinning agent.    Current Facility-Administered Medications:  .  0.9 %  sodium chloride infusion, , Intravenous, Continuous, Lollie Sails, MD, Last Rate: 20 mL/hr at 05/22/17 0903, 1,000 mL at 05/22/17 0903 .  0.9 %  sodium chloride infusion, , Intravenous, Continuous, Lollie Sails, MD  Medications Prior to Admission  Medication Sig Dispense Refill Last Dose  . acetaminophen (TYLENOL) 500 MG tablet Take 1,000 mg by mouth every 6 (six) hours as needed.   Past Week at Unknown time  . aspirin 325 MG tablet Take 325 mg by mouth daily.   Past Week at Unknown time  . DIPHENHYDRAMINE HCL PO Take by mouth daily.   Past Week at Unknown time  . Estradiol 10 MCG TABS vaginal tablet Place vaginally.   Past Week at Unknown time  . fluticasone (FLONASE) 50 MCG/ACT nasal spray Place 2 sprays into both nostrils daily.   Past Week at Unknown time  . loratadine (CLARITIN) 10 MG tablet Take 10 mg by mouth daily.   Past Week at Unknown time  . montelukast (SINGULAIR) 10 MG tablet Take 10 mg by mouth at bedtime.   Past Week at Unknown time  . nitrofurantoin, macrocrystal-monohydrate, (MACROBID) 100 MG capsule Take 100 mg by mouth 2 (two) times daily.  0 Past Week at Unknown time  . omeprazole (PRILOSEC) 40 MG capsule Take 40 mg by mouth daily.   05/21/2017 at Unknown time  . pravastatin (PRAVACHOL) 20 MG tablet Take 20 mg by mouth daily.   Past Week at Unknown time     Allergies  Allergen Reactions  . Lidocaine Other (See Comments)     UTI (polyuria, dysuria) and facial flushing  . Sulfa Antibiotics Other (See Comments)    UNSPECIFIED   . Methylprednisolone Palpitations     Past Medical History:  Diagnosis Date  . Allergy   . Arthritis   . Complication of anesthesia   . DVT (deep venous thrombosis) ([place])   . GERD (gastroesophageal reflux disease)   . H/O seasonal allergies   . PONV (postoperative nausea and vomiting)    Gets sick every time  . Pulmonary embolism ([place])   . Spondylolisthesis     Review of systems:      Physical Exam    Heart and lungs: Regular rate and rhythm without rub or gallop, lungs are bilaterally clear.    HEENT: Normocephalic atraumatic eyes are anicteric    Other:    Pertinant exam for procedure: Soft nontender nondistended bowel sounds positive normoactive.    Planned proceedures: Colonoscopy and indicated procedures. I have discussed the risks benefits and complications of procedures to include not limited to bleeding, infection, perforation and the risk of sedation and the patient wishes to proceed. Lenard Galloway, MD Gastroenterology 05/22/2017  9:19 AM

## 2017-05-23 ENCOUNTER — Encounter: Payer: Self-pay | Admitting: Gastroenterology

## 2017-05-23 LAB — SURGICAL PATHOLOGY

## 2017-09-01 ENCOUNTER — Ambulatory Visit (INDEPENDENT_AMBULATORY_CARE_PROVIDER_SITE_OTHER): Payer: Medicare Other | Admitting: Vascular Surgery

## 2017-09-01 ENCOUNTER — Encounter (INDEPENDENT_AMBULATORY_CARE_PROVIDER_SITE_OTHER): Payer: Self-pay | Admitting: Vascular Surgery

## 2017-09-01 ENCOUNTER — Ambulatory Visit (INDEPENDENT_AMBULATORY_CARE_PROVIDER_SITE_OTHER): Payer: Medicare Other

## 2017-09-01 VITALS — BP 123/83 | HR 71 | Resp 13 | Ht 64.0 in | Wt 195.0 lb

## 2017-09-01 DIAGNOSIS — M7989 Other specified soft tissue disorders: Secondary | ICD-10-CM | POA: Diagnosis not present

## 2017-09-01 DIAGNOSIS — Z86718 Personal history of other venous thrombosis and embolism: Secondary | ICD-10-CM | POA: Diagnosis not present

## 2017-09-01 DIAGNOSIS — I872 Venous insufficiency (chronic) (peripheral): Secondary | ICD-10-CM

## 2017-09-01 NOTE — Progress Notes (Signed)
MRN : [id number]  Elizabeth Crawford is a 72 y.o. (1945-12-24) female who presents with chief complaint of  Chief Complaint  Patient presents with  . Follow-up    1 year follow up  .  History of Present Illness:  The patient returns to the office for follow up regarding her past DVT.  DVT was identified at [facility] by Duplex ultrasound remotely.  The initial symptoms were pain and swelling in the lower extremity.  At that time she had a filter placed at that time. Several months later attempts at removing the filter were unsuccessful. Since then we have been monitoring the patency of the inferior vena cava.  The patient notes her legs are mild to moderately painful with dependency and swell.  Symptoms are much better with elevation.  The patient notes minimal edema in the morning which steadily worsens throughout the day.    The patient has not been using compression therapy at this point.  No SOB or pleuritic chest pains.  No cough or hemoptysis.  No blood per rectum or blood in any sputum.  No excessive bruising per the patient.   Duplex ultrasound of the inferior vena cava shows the filter is patent and normal flow within the cava     Current Meds  Medication Sig  . methocarbamol (ROBAXIN) 500 MG tablet Take by mouth.  . rosuvastatin (CRESTOR) 5 MG tablet Take by mouth.    Past Medical History:  Diagnosis Date  . Allergy   . Arthritis   . Complication of anesthesia   . DVT (deep venous thrombosis) ([place])   . GERD (gastroesophageal reflux disease)   . H/O seasonal allergies   . PONV (postoperative nausea and vomiting)    Gets sick every time  . Pulmonary embolism ([place])   . Spondylolisthesis     Past Surgical History:  Procedure Laterality Date  . BLADDER SUSPENSION  2009  . CARPAL TUNNEL RELEASE  2003   Left hand  . CHOLECYSTECTOMY  2000   Laparoscopic  . COLONOSCOPY W/ POLYPECTOMY  2014  . COLONOSCOPY WITH PROPOFOL N/A 05/22/2017   Procedure: COLONOSCOPY  WITH PROPOFOL;  Surgeon: Lollie Sails, MD;  Location: [facility] ENDOSCOPY;  Service: Endoscopy;  Laterality: N/A;  . DECOMPRESSIVE LUMBAR LAMINECTOMY LEVEL 2 N/A 11/09/2015   Procedure: DECOMPRESSION L2-5 REMOVAL PEDICULE SCREWS L4-5;  Surgeon: Melina Schools, MD;  Location: [place];  Service: Orthopedics;  Laterality: N/A;  . EYE SURGERY Bilateral 2005   cataract surgery   . HARDWARE REMOVAL N/A 11/09/2015   Procedure: HARDWARE REMOVAL;  Surgeon: Melina Schools, MD;  Location: [place];  Service: Orthopedics;  Laterality: N/A;  . [place]  . IVC filter placement  02-01-10  . SPINE SURGERY  5 23-12   anterolateral fusion/ rodding  . TONSILLECTOMY    . TUBAL LIGATION  1977    Social History Social History   Tobacco Use  . Smoking status: Former Smoker    Years: 15.00    Types: Cigarettes    Last attempt to quit: 02/26/1976    Years since quitting: 41.5  . Smokeless tobacco: Former Network engineer Use Topics  . Alcohol use: No    Comment: occasionally  . Drug use: No    Family History Family History  Problem Relation Age of Onset  . COPD Mother   . Heart disease Mother   . Aortic aneurysm Mother   . Heart disease Father     Allergies  Allergen  Reactions  . Lidocaine Other (See Comments)    UTI (polyuria, dysuria) and facial flushing  . Sulfa Antibiotics Other (See Comments)    UNSPECIFIED   . Methylprednisolone Palpitations  . Sulfasalazine Nausea Only  . Sulfur Nausea And Vomiting     REVIEW OF SYSTEMS (Negative unless checked)  Constitutional: []Weight loss  []Fever  []Chills Cardiac: []Chest pain   []Chest pressure   []Palpitations   []Shortness of breath when laying flat   []Shortness of breath with exertion. Vascular:  [x]Pain in legs with walking   [x]Pain in legs at rest  []History of DVT   []Phlebitis   [x]Swelling in legs   []Varicose veins   []Non-healing ulcers Pulmonary:   []Uses home oxygen   []Productive cough   []Hemoptysis   []Wheeze   []COPD   []Asthma Neurologic:  []Dizziness   []Seizures   []History of stroke   []History of TIA  []Aphasia   []Vissual changes   []Weakness or numbness in arm   []Weakness or numbness in leg Musculoskeletal:   []Joint swelling   []Joint pain   []Low back pain Hematologic:  []Easy bruising  []Easy bleeding   []Hypercoagulable state   []Anemic Gastrointestinal:  []Diarrhea   []Vomiting  []Gastroesophageal reflux/heartburn   []Difficulty swallowing. Genitourinary:  []Chronic kidney disease   []Difficult urination  []Frequent urination   []Blood in urine Skin:  []Rashes   []Ulcers  Psychological:  []History of anxiety   [] History of major depression.  Physical Examination  There were no vitals filed for this visit. There is no height or weight on file to calculate BMI. Gen: WD/WN, NAD Head: [place]/AT, No temporalis wasting.  Ear/Nose/Throat: Hearing grossly intact, nares w/o erythema or drainage Eyes: PER, EOMI, sclera nonicteric.  Neck: Supple, no large masses.   Pulmonary:  Good air movement, no audible wheezing bilaterally, no use of accessory muscles.  Cardiac: RRR, no JVD Vascular: scattered varicosities present bilaterally.  Mild venous stasis changes to the legs bilaterally.  2+ soft pitting edema Vessel Right Left  Radial Palpable Palpable  PT Palpable Palpable  DP Palpable Palpable  Gastrointestinal: Non-distended. No guarding/no peritoneal signs.  Musculoskeletal: M/S 5/5 throughout.  No deformity or atrophy.  Neurologic: CN 2-12 intact. Symmetrical.  Speech is fluent. Motor exam as listed above. Psychiatric: Judgment intact, Mood & affect appropriate for pt's clinical situation. Dermatologic: venous rashes no ulcers noted.  No changes consistent with cellulitis. Lymph : No lichenification or skin changes of chronic lymphedema.  CBC Lab Results  Component Value Date   WBC 7.5 08/08/2010   HGB 13.9 08/08/2010   HCT 41.2 08/08/2010   MCV 92.4 08/08/2010   PLT 189 08/08/2010      BMET    Component Value Date/Time   NA 140 08/08/2010 1112   K 4.2 08/08/2010 1112   CL 102 08/08/2010 1112   CO2 30 08/08/2010 1112   GLUCOSE 94 08/08/2010 1112   BUN 15 08/08/2010 1112   CREATININE 0.85 08/08/2010 1112   CALCIUM 10.3 08/08/2010 1112   GFRNONAA >60 08/08/2010 1112   GFRAA  08/08/2010 1112    >60        The eGFR has been calculated using the MDRD equation. This calculation has not been validated in all clinical situations. eGFR's persistently <60 mL/min signify possible Chronic Kidney Disease.   CrCl cannot be calculated (Patient's most recent lab result is older than the maximum 21 days allowed.).  COAG Lab Results  Component Value Date  INR 1.17 08/18/2010   INR 1.09 08/17/2010   INR 1.07 08/15/2010    Radiology No results found.   Assessment/Plan 1. History of DVT (deep vein thrombosis) Recommend:   No surgery or intervention at this point in time.  IVC filter is present and was unable to be retrieved..  Patient's duplex ultrasound of the venous system shows the IVC is widely patent and the filter is intact.  The patient has completed her anticoagulation   Elevation was stressed, use of a recliner was discussed.  I have had a long discussion with the patient regarding DVT and post phlebitic changes such as swelling and why it  causes symptoms such as pain.  The patient will wear graduated compression stockings class 1 (20-30 mmHg), beginning after three full days of anticoagulation, on a daily basis a prescription was given. The patient will  beginning wearing the stockings first thing in the morning and removing them in the evening. The patient is instructed specifically not to sleep in the stockings.  In addition, behavioral modification including elevation during the day and avoidance of prolonged dependency will be initiated.    The patient will continue anticoagulation for now as there have not been any problems or  complications at this point.    - VAS US AORTA/IVC/ILIACS; Future  2. Chronic venous insufficiency No surgery or intervention at this point in time.    I have had a long discussion with the patient regarding venous insufficiency and why it  causes symptoms. I have discussed with the patient the chronic skin changes that accompany venous insufficiency and the long term sequela such as infection and ulceration.  Patient will begin wearing graduated compression stockings class 1 (20-30 mmHg) or compression wraps on a daily basis a prescription was given. The patient will put the stockings on first thing in the morning and removing them in the evening. The patient is instructed specifically not to sleep in the stockings.    In addition, behavioral modification including several periods of elevation of the lower extremities during the day will be continued. I have demonstrated that proper elevation is a position with the ankles at heart level.  The patient is instructed to begin routine exercise, especially walking on a daily basis  Following the review of the ultrasound the patient will follow up in 2-3 months to reassess the degree of swelling and the control that graduated compression stockings or compression wraps  is offering.   The patient can be assessed for a Lymph Pump at that time  3. Leg swelling See #1&2   Hortencia Pilar, MD  09/01/2017 9:24 AM

## 2018-05-25 ENCOUNTER — Other Ambulatory Visit (HOSPITAL_COMMUNITY): Payer: Self-pay | Admitting: Pulmonary Disease

## 2018-05-25 ENCOUNTER — Other Ambulatory Visit: Payer: Self-pay | Admitting: Pulmonary Disease

## 2018-05-25 DIAGNOSIS — R0602 Shortness of breath: Secondary | ICD-10-CM

## 2018-05-27 ENCOUNTER — Ambulatory Visit
Admission: RE | Admit: 2018-05-27 | Discharge: 2018-05-27 | Disposition: A | Discharge status: Home / Self Care | Payer: Medicare Other | Source: Ambulatory Visit | Attending: Pulmonary Disease | Admitting: Pulmonary Disease

## 2018-05-27 ENCOUNTER — Other Ambulatory Visit: Payer: Self-pay

## 2018-05-27 DIAGNOSIS — R0602 Shortness of breath: Secondary | ICD-10-CM | POA: Insufficient documentation

## 2018-05-27 LAB — POCT I-STAT CREATININE: Creatinine, Ser: 0.9 mg/dL (ref 0.44–1.00)

## 2018-05-27 MED ORDER — IOHEXOL 300 MG/ML  SOLN
75.0000 mL | Freq: Once | INTRAMUSCULAR | Status: AC | PRN
  Administered 2018-05-27: 75 mL via INTRAVENOUS

## 2018-05-29 ENCOUNTER — Encounter: Payer: Self-pay | Admitting: Pulmonary Disease

## 2018-06-02 ENCOUNTER — Other Ambulatory Visit: Payer: Self-pay | Admitting: Pulmonary Disease

## 2018-06-02 DIAGNOSIS — R911 Solitary pulmonary nodule: Secondary | ICD-10-CM

## 2018-06-03 ENCOUNTER — Other Ambulatory Visit: Payer: Self-pay | Admitting: Pulmonary Disease

## 2018-06-03 DIAGNOSIS — R911 Solitary pulmonary nodule: Secondary | ICD-10-CM

## 2018-06-03 DIAGNOSIS — R9389 Abnormal findings on diagnostic imaging of other specified body structures: Secondary | ICD-10-CM

## 2018-06-11 ENCOUNTER — Ambulatory Visit
Admission: RE | Admit: 2018-06-11 | Discharge: 2018-06-11 | Disposition: A | Discharge status: Home / Self Care | Payer: Medicare Other | Source: Ambulatory Visit | Attending: Pulmonary Disease | Admitting: Pulmonary Disease

## 2018-06-11 ENCOUNTER — Other Ambulatory Visit: Payer: Self-pay

## 2018-06-11 DIAGNOSIS — R911 Solitary pulmonary nodule: Secondary | ICD-10-CM | POA: Diagnosis present

## 2018-06-11 DIAGNOSIS — R9389 Abnormal findings on diagnostic imaging of other specified body structures: Secondary | ICD-10-CM | POA: Diagnosis not present

## 2018-06-11 MED ORDER — IOHEXOL 300 MG/ML  SOLN
100.0000 mL | Freq: Once | INTRAMUSCULAR | Status: AC | PRN
  Administered 2018-06-11: 100 mL via INTRAVENOUS

## 2018-08-10 ENCOUNTER — Ambulatory Visit: Payer: Medicare Other | Admitting: Surgery

## 2018-08-24 ENCOUNTER — Other Ambulatory Visit: Payer: Self-pay

## 2018-08-24 ENCOUNTER — Ambulatory Visit (INDEPENDENT_AMBULATORY_CARE_PROVIDER_SITE_OTHER): Payer: Medicare Other | Admitting: Surgery

## 2018-08-24 ENCOUNTER — Encounter: Payer: Self-pay | Admitting: Surgery

## 2018-08-24 VITALS — BP 128/76 | HR 76 | Temp 97.2°F | Resp 16 | Ht 64.0 in | Wt 202.8 lb

## 2018-08-24 DIAGNOSIS — K449 Diaphragmatic hernia without obstruction or gangrene: Secondary | ICD-10-CM | POA: Diagnosis not present

## 2018-08-24 DIAGNOSIS — Z86711 Personal history of pulmonary embolism: Secondary | ICD-10-CM | POA: Insufficient documentation

## 2018-08-24 DIAGNOSIS — K219 Gastro-esophageal reflux disease without esophagitis: Secondary | ICD-10-CM | POA: Insufficient documentation

## 2018-08-24 NOTE — Progress Notes (Signed)
Patient ID: Elizabeth Crawford, female   DOB: [birth year], 73 y.o.   MRN: [id number]  HPI Elizabeth Crawford is a 73 y.o. female asked to see in consultation at the request of Dr. Lanney Gins for a large Hiatal hernia.  She reports significant reflux that currently is controlled with PPI.  She states that she does have reflux symptoms when she lays down flat.  She uses a wedge to sleep every day.  She does have some acid taste in her mouth in the morning and some chronic cough.  She has been recently being worked up by pulmonary medicine regarding her asthma and has been placed on multiple inhalers as well as nebulizer treatments.  Dr.Aleskerov is concerned that some of her pulmonary symptoms are related to reflux and a large hiatal hernia with chronic microaspiration.  SHe did have a CT scan of the abdomen and chest that I have personally reviewed showing a large hiatal hernia type III that contains at least 60% of her stomach within the mediastinum. She reports no evidence of dysphasia.  No fevers no chills.  She is able to walk and perform more than 4 METS of activity without any chest pain.  Did smoke in the remote past. She had a DVT as well as IVC filter that was unable to be retrieved. She Is not on systemic anticoagulation but is taking full dose aspirin daily Did have a colonoscopy about a year ago but no upper endoscopy recently CBC and CMP from 3 weeks ago was completely normal  HPI  Past Medical History:  Diagnosis Date  . Allergy   . Arthritis   . Complication of anesthesia   . DVT (deep venous thrombosis) ([place])   . GERD (gastroesophageal reflux disease)   . H/O seasonal allergies   . PONV (postoperative nausea and vomiting)    Gets sick every time  . Pulmonary embolism ([place])   . Spondylolisthesis     Past Surgical History:  Procedure Laterality Date  . BLADDER SUSPENSION  2009  . CARPAL TUNNEL RELEASE  2003   Left hand  . CHOLECYSTECTOMY  2000   Laparoscopic  . COLONOSCOPY W/  POLYPECTOMY  2014  . COLONOSCOPY WITH PROPOFOL N/A 05/22/2017   Procedure: COLONOSCOPY WITH PROPOFOL;  Surgeon: Lollie Sails, MD;  Location: [facility] ENDOSCOPY;  Service: Endoscopy;  Laterality: N/A;  . DECOMPRESSIVE LUMBAR LAMINECTOMY LEVEL 2 N/A 11/09/2015   Procedure: DECOMPRESSION L2-5 REMOVAL PEDICULE SCREWS L4-5;  Surgeon: Melina Schools, MD;  Location: [place];  Service: Orthopedics;  Laterality: N/A;  . EYE SURGERY Bilateral 2005   cataract surgery   . HARDWARE REMOVAL N/A 11/09/2015   Procedure: HARDWARE REMOVAL;  Surgeon: Melina Schools, MD;  Location: [place];  Service: Orthopedics;  Laterality: N/A;  . [place]  . IVC filter placement  02-01-10  . SPINE SURGERY  5 23-12   anterolateral fusion/ rodding  . TONSILLECTOMY    . TUBAL LIGATION  1977    Family History  Problem Relation Age of Onset  . COPD Mother   . Heart disease Mother   . Aortic aneurysm Mother   . Heart disease Father     Social History Social History   Tobacco Use  . Smoking status: Former Smoker    Years: 15.00    Types: Cigarettes    Last attempt to quit: 02/26/1976    Years since quitting: 42.5  . Smokeless tobacco: Former Network engineer Use Topics  . Alcohol use: No  Comment: occasionally  . Drug use: No    Allergies  Allergen Reactions  . Lidocaine Other (See Comments)    UTI (polyuria, dysuria) and facial flushing  . Sulfa Antibiotics Other (See Comments)    UNSPECIFIED   . Methylprednisolone Palpitations  . Sulfasalazine Nausea Only  . Sulfur Nausea And Vomiting    Current Outpatient Medications  Medication Sig Dispense Refill  . acetaminophen (TYLENOL) 500 MG tablet Take 1,000 mg by mouth every 6 (six) hours as needed.    Marland Kitchen aspirin 325 MG tablet Take 325 mg by mouth daily.    Marland Kitchen DIPHENHYDRAMINE HCL PO Take by mouth daily.    . Estradiol 10 MCG TABS vaginal tablet Place vaginally.    Marland Kitchen loratadine (CLARITIN) 10 MG tablet Take 10 mg by mouth daily.    Marland Kitchen omeprazole  (PRILOSEC) 40 MG capsule Take 40 mg by mouth daily.    . rosuvastatin (CRESTOR) 5 MG tablet Take by mouth.     No current facility-administered medications for this visit.      Review of Systems Full ROS  was asked and was negative except for the information on the HPI  Physical Exam Blood pressure 128/76, pulse 76, temperature (!) 97.2 F (36.2 C), temperature source Temporal, resp. rate 16, height 5\' 4"  (1.626 m), weight 202 lb 12.8 oz (92 kg), SpO2 96 %. CONSTITUTIONAL: NAD EYES: Pupils are equal, round, and reactive to light, Sclera are non-icteric. EARS, NOSE, MOUTH AND THROAT: The oropharynx is clear. The oral mucosa is pink and moist. Hearing is intact to voice. LYMPH NODES:  Lymph nodes in the neck are normal. RESPIRATORY:  Lungs are clear. There is normal respiratory effort, with equal breath sounds bilaterally, and without pathologic use of accessory muscles. CARDIOVASCULAR: Heart is regular without murmurs, gallops, or rubs. GI: The abdomen is soft, nontender, and nondistended. There are no palpable masses. There is no hepatosplenomegaly. There are normal bowel sounds in all quadrants. GU: Rectal deferred.   MUSCULOSKELETAL: Normal muscle strength and tone. No cyanosis or edema.   SKIN: Turgor is good and there are no pathologic skin lesions or ulcers. NEUROLOGIC: Motor and sensation is grossly normal. Cranial nerves are grossly intact. PSYCH:  Oriented to person, place and time. Affect is normal.  Data Reviewed  I have personally reviewed the patient's imaging, laboratory findings and medical records.    Assessment/Plan 73 year old female with large hiatal hernia with significant reflux symptoms.  Discussed with patient and the family in detail about her disease process.  I was able to show her the CT images as well to explain to her her natural history of disease.  Before we plan any operative intervention I would like to study her anatomy ordering a barium swallow as well  as an endoscopy.  Once we have some more imaging studies I will reassess her again and we will talk about choices for surgical intervention.  Currently she does have some back pain issues and at some point time it was recommended that she had a breast reduction surgery to help with her chronic pain.  She is also in the midst of this spine work-up.  We will wait for the final imaging studies I will see her back in about 3 weeks so we have a better idea about what we are able to offer her.  All her questions were answered.  Extensive counseling was provided.  A copy of this report was sent to the referring provider.   Elizabeth Hamman, MD FACS  General Surgeon 08/24/2018, 1:14 PM

## 2018-08-24 NOTE — Patient Instructions (Addendum)
Barium Swallow test is scheduled for 09/01/2018 @ 10 am @ [facility]. Please do not eat or drink anything after midnight.  Please arrive 15 minutes prior.   Referral to [facility] office. Someone from their office will call to schedule an appointment. If you do not hear from their office within 5-7 days please call our office and let us know so we can check on this for you.   Please see your follow up appointment listed below.

## 2018-08-25 NOTE — Progress Notes (Signed)
Referral for upper endoscopy faxed to Dr Marton Redwood office. AT:[phone] YHC:[phone]

## 2018-09-01 ENCOUNTER — Inpatient Hospital Stay: Admission: RE | Admit: 2018-09-01 | Payer: Medicare Other | Source: Ambulatory Visit

## 2018-09-01 ENCOUNTER — Other Ambulatory Visit: Payer: Self-pay | Admitting: Surgery

## 2018-09-01 ENCOUNTER — Ambulatory Visit
Admission: RE | Admit: 2018-09-01 | Discharge: 2018-09-01 | Disposition: A | Discharge status: Home / Self Care | Payer: Medicare Other | Source: Ambulatory Visit | Attending: Surgery | Admitting: Surgery

## 2018-09-01 ENCOUNTER — Other Ambulatory Visit: Payer: Self-pay

## 2018-09-01 ENCOUNTER — Telehealth: Payer: Self-pay | Admitting: *Deleted

## 2018-09-01 DIAGNOSIS — K449 Diaphragmatic hernia without obstruction or gangrene: Secondary | ICD-10-CM | POA: Insufficient documentation

## 2018-09-01 NOTE — Telephone Encounter (Signed)
Notified patient as instructed, patient agrees. Patient also aware of appointment day and time

## 2018-09-01 NOTE — Telephone Encounter (Signed)
Left message for patient to call us back.  

## 2018-09-01 NOTE — Telephone Encounter (Signed)
-----   Message from Jules Husbands, MD sent at 09/01/2018 11:26 AM EDT ----- Please let her know her xray showed a known hiatal hernia. I will d/w her further on next f/u appt ----- Message ----- From: Interface, Rad Results In Sent: 09/01/2018   9:51 AM EDT To: Jules Husbands, MD

## 2018-09-07 ENCOUNTER — Ambulatory Visit (INDEPENDENT_AMBULATORY_CARE_PROVIDER_SITE_OTHER): Payer: Medicare Other

## 2018-09-07 ENCOUNTER — Encounter (INDEPENDENT_AMBULATORY_CARE_PROVIDER_SITE_OTHER): Payer: Self-pay | Admitting: Vascular Surgery

## 2018-09-07 ENCOUNTER — Other Ambulatory Visit: Payer: Self-pay

## 2018-09-07 ENCOUNTER — Ambulatory Visit (INDEPENDENT_AMBULATORY_CARE_PROVIDER_SITE_OTHER): Payer: Medicare Other | Admitting: Vascular Surgery

## 2018-09-07 VITALS — BP 126/81 | HR 70 | Resp 12 | Ht 64.0 in | Wt 200.0 lb

## 2018-09-07 DIAGNOSIS — M549 Dorsalgia, unspecified: Secondary | ICD-10-CM | POA: Diagnosis not present

## 2018-09-07 DIAGNOSIS — K219 Gastro-esophageal reflux disease without esophagitis: Secondary | ICD-10-CM | POA: Diagnosis not present

## 2018-09-07 DIAGNOSIS — I872 Venous insufficiency (chronic) (peripheral): Secondary | ICD-10-CM

## 2018-09-07 DIAGNOSIS — Z86718 Personal history of other venous thrombosis and embolism: Secondary | ICD-10-CM

## 2018-09-07 DIAGNOSIS — G8929 Other chronic pain: Secondary | ICD-10-CM

## 2018-09-07 NOTE — Progress Notes (Signed)
MRN : [id number]  Elizabeth Crawford is a 73 y.o. ([birth year]) female who presents with chief complaint of No chief complaint on file. Marland Kitchen  History of Present Illness:  The patient returns to the office for follow up regarding her past DVT. DVT was identified at [facility] by Duplex ultrasound remotely. The initial symptoms were pain and swelling in the lower extremity.  At that time she had a filter placedat that time. Several months later attempts at removing the filter were unsuccessful. Since then we have been monitoring the patency of the inferior vena cava.  The patient notesher legs are mild to moderatelypainful with dependency and swell. Symptoms are much better with elevation. The patient notes minimal edema in the morning which steadily worsens throughout the day.   The patient has not been using compression therapy at this point.  No SOB or pleuritic chest pains. No cough or hemoptysis.  No blood per rectum or blood in any sputum. No excessive bruising per the patient.  Duplex ultrasound of the inferior vena cava shows the filter is patent and normal flow within the cava  No outpatient medications have been marked as taking for the 09/07/18 encounter (Appointment) with Delana Meyer, Dolores Lory, MD.    Past Medical History:  Diagnosis Date   Allergy    Arthritis    Complication of anesthesia    DVT (deep venous thrombosis) ([place])    GERD (gastroesophageal reflux disease)    H/O seasonal allergies    PONV (postoperative nausea and vomiting)    Gets sick every time   Pulmonary embolism ([place])    Spondylolisthesis     Past Surgical History:  Procedure Laterality Date   BLADDER SUSPENSION  2009   CARPAL TUNNEL RELEASE  2003   Left hand   CHOLECYSTECTOMY  2000   Laparoscopic   COLONOSCOPY W/ POLYPECTOMY  2014   COLONOSCOPY WITH PROPOFOL N/A 05/22/2017   Procedure: COLONOSCOPY WITH PROPOFOL;  Surgeon: Lollie Sails, MD;  Location: [facility] ENDOSCOPY;   Service: Endoscopy;  Laterality: N/A;   DECOMPRESSIVE LUMBAR LAMINECTOMY LEVEL 2 N/A 11/09/2015   Procedure: DECOMPRESSION L2-5 REMOVAL PEDICULE SCREWS L4-5;  Surgeon: Melina Schools, MD;  Location: [place];  Service: Orthopedics;  Laterality: N/A;   EYE SURGERY Bilateral 2005   cataract surgery    HARDWARE REMOVAL N/A 11/09/2015   Procedure: HARDWARE REMOVAL;  Surgeon: Melina Schools, MD;  Location: [place];  Service: Orthopedics;  Laterality: N/A;   HEMORRHOID SURGERY  1978   IVC filter placement  02-01-10   SPINE SURGERY  5 23-12   anterolateral fusion/ rodding   TONSILLECTOMY     TUBAL LIGATION  1977    Social History Social History   Tobacco Use   Smoking status: Former Smoker    Years: 15.00    Types: Cigarettes    Quit date: 02/26/1976    Years since quitting: 42.5   Smokeless tobacco: Former Systems developer  Substance Use Topics   Alcohol use: No    Comment: occasionally   Drug use: No    Family History Family History  Problem Relation Age of Onset   COPD Mother    Heart disease Mother    Aortic aneurysm Mother    Heart disease Father     Allergies  Allergen Reactions   Lidocaine Other (See Comments)    UTI (polyuria, dysuria) and facial flushing   Sulfa Antibiotics Other (See Comments)    UNSPECIFIED    Methylprednisolone Palpitations   Sulfasalazine Nausea  Only   Sulfur Nausea And Vomiting     REVIEW OF SYSTEMS (Negative unless checked)  Constitutional: _0 Weight loss  _1 Fever  _2 Chills Cardiac: _3 Chest pain   _4 Chest pressure   _5 Palpitations   _6 Shortness of breath when laying flat   _7 Shortness of breath with exertion. Vascular:  _8 Pain in legs with walking   _9 Pain in legs at rest  _10 History of DVT   _11 Phlebitis   _12 Swelling in legs   _13 Varicose veins   _14 Non-healing ulcers Pulmonary:   _15 Uses home oxygen   _16 Productive cough   _17 Hemoptysis   _18 Wheeze  _19 COPD   _20 Asthma Neurologic:  _21 Dizziness   _22 Seizures   _23 History of stroke   _24 History of  TIA  _25 Aphasia   _26 Vissual changes   _27 Weakness or numbness in arm   _28 Weakness or numbness in leg Musculoskeletal:   _29 Joint swelling   _30 Joint pain   _31 Low back pain Hematologic:  _32 Easy bruising  _33 Easy bleeding   _34 Hypercoagulable state   _35 Anemic Gastrointestinal:  _36 Diarrhea   _37 Vomiting  _38 Gastroesophageal reflux/heartburn   _39 Difficulty swallowing. Genitourinary:  _40 Chronic kidney disease   _41 Difficult urination  _42 Frequent urination   _43 Blood in urine Skin:  _44 Rashes   _45 Ulcers  Psychological:  _46 History of anxiety   _47  History of major depression.  Physical Examination  There were no vitals filed for this visit. There is no height or weight on file to calculate BMI. Gen: WD/WN, NAD Head: [place]/AT, No temporalis wasting.  Ear/Nose/Throat: Hearing grossly intact, nares w/o erythema or drainage Eyes: PER, EOMI, sclera nonicteric.  Neck: Supple, no large masses.   Pulmonary:  Good air movement, no audible wheezing bilaterally, no use of accessory muscles.  Cardiac: RRR, no JVD Vascular: scattered varicosities present bilaterally.  Mild venous stasis changes to the legs bilaterally.  2+ soft pitting edema Vessel Right Left  Radial Palpable Palpable  Gastrointestinal: Non-distended. No guarding/no peritoneal signs.  Musculoskeletal: M/S 5/5 throughout.  No deformity or atrophy.  Neurologic: CN 2-12 intact. Symmetrical.  Speech is fluent. Motor exam as listed above. Psychiatric: Judgment intact, Mood & affect appropriate for pt's clinical situation. Dermatologic: Mild venous rashes no ulcers noted.  No changes consistent with cellulitis. Lymph : No lichenification or skin changes of chronic lymphedema.  CBC Lab Results  Component Value Date   WBC 7.5 08/08/2010   HGB 13.9 08/08/2010   HCT 41.2 08/08/2010   MCV 92.4 08/08/2010   PLT 189 08/08/2010    BMET    Component Value Date/Time   NA 140 08/08/2010 1112   K 4.2 08/08/2010 1112   CL 102 08/08/2010 1112   CO2 30  08/08/2010 1112   GLUCOSE 94 08/08/2010 1112   BUN 15 08/08/2010 1112   CREATININE 0.90 05/27/2018 1127   CALCIUM 10.3 08/08/2010 1112   GFRNONAA >60 08/08/2010 1112   GFRAA  08/08/2010 1112    >60        The eGFR has been calculated using the MDRD equation. This calculation has not been validated in all clinical situations. eGFR's persistently <60 mL/min signify possible Chronic Kidney Disease.   CrCl cannot be calculated (Patient's most recent lab result is older than the maximum 21 days allowed.).  COAG Lab Results  Component Value Date   INR 1.17 08/18/2010   INR 1.09 08/17/2010   INR 1.07 08/15/2010    Radiology Dg Esophagus W Double Cm (hd)  Result Date: 09/01/2018 CLINICAL DATA:  History shortness of breath, hiatal hernia EXAM: ESOPHOGRAM / BARIUM SWALLOW / BARIUM TABLET  STUDY TECHNIQUE: Combined double contrast and single contrast examination performed using effervescent crystals, thick barium liquid, and thin barium liquid. The patient was observed with fluoroscopy swallowing a 13 mm barium sulphate tablet. FLUOROSCOPY TIME:  Fluoroscopy Time:  42 seconds Radiation Exposure Index (if provided by the fluoroscopic device): 4.4 mGy Number of Acquired Spot Images: 0 COMPARISON:  None. FINDINGS: There was normal pharyngeal anatomy and motility. Contrast flowed freely through the esophagus without evidence of stricture or mass. There was normal esophageal mucosa without evidence of irregularity or ulceration. Esophageal motility was normal. Moderate severity gastroesophageal reflux. Large hiatal hernia with the fundus and body of the stomach above the diaphragm. No malrotation. At the end of the examination a 13 mm barium tablet was administered which transited through the esophagus and esophagogastric junction without delay. IMPRESSION: 1. Large hiatal hernia with the fundus and body of the stomach above the diaphragm. No malrotation. 2. Moderate severity gastroesophageal reflux.  Electronically Signed   By: Kathreen Devoid   On: 09/01/2018 09:48     Assessment/Plan 1. Chronic venous insufficiency Recommend:   No surgery or intervention at this point in time.  IVC filter is presentand was unable to be retrieved..  Patient's duplex ultrasound of the venous systemshows the IVC is widely patent and the filter is intact.  The patienthas completed heranticoagulation   Elevation was stressed, use of a recliner was discussed.  I have had a long discussion with the patient regarding DVT and post phlebitic changes such as swelling and why it causes symptoms such as pain. The patient will wear graduated compression stockings class 1 (20-30 mmHg), beginning after three full days of anticoagulation, on a daily basis a prescription was given. The patient will beginning wearing the stockings first thing in the morning and removing them in the evening. The patient is instructed specifically not to sleep in the stockings. In addition, behavioral modification including elevation during the day and avoidance of prolonged dependency will be initiated.   The patient will continue anticoagulation for now as there have not been any problems or complications at this point.  2. History of DVT (deep vein thrombosis) Recommend:   No surgery or intervention at this point in time.  IVC filter is presentand was unable to be retrieved..  Patient's duplex ultrasound of the venous systemshows the IVC is widely patent and the filter is intact.  The patienthas completed heranticoagulation   Elevation was stressed, use of a recliner was discussed.  I have had a long discussion with the patient regarding DVT and post phlebitic changes such as swelling and why it causes symptoms such as pain. The patient will wear graduated compression stockings class 1 (20-30 mmHg), beginning after three full days of anticoagulation, on a daily basis a prescription was given. The patient  will beginning wearing the stockings first thing in the morning and removing them in the evening. The patient is instructed specifically not to sleep in the stockings. In addition, behavioral modification including elevation during the day and avoidance of prolonged dependency will be initiated.   The patient will continue anticoagulation for now as there have not been any problems or complications at this point. - VAS US AORTA/IVC/ILIACS; Future  3. Gastroesophageal reflux disease without esophagitis Continue PPI as already ordered, this medication has been reviewed and there are no changes at this time.  Avoidence of caffeine and alcohol  Moderate elevation of the head of the bed   4. Chronic back pain, unspecified back location,  unspecified back pain laterality Continue NSAID medications as already ordered, these medications have been reviewed and there are no changes at this time.  Continued activity and therapy was stressed.    Hortencia Pilar, MD  09/07/2018 8:35 AM

## 2018-09-13 ENCOUNTER — Encounter (INDEPENDENT_AMBULATORY_CARE_PROVIDER_SITE_OTHER): Payer: Self-pay | Admitting: Vascular Surgery

## 2018-09-14 ENCOUNTER — Ambulatory Visit: Payer: Self-pay | Admitting: Surgery

## 2018-10-05 ENCOUNTER — Other Ambulatory Visit
Admission: RE | Admit: 2018-10-05 | Discharge: 2018-10-05 | Disposition: A | Discharge status: Home / Self Care | Payer: Medicare Other | Source: Ambulatory Visit | Attending: Obstetrics and Gynecology | Admitting: Obstetrics and Gynecology

## 2018-10-05 ENCOUNTER — Telehealth: Payer: Self-pay | Admitting: Plastic Surgery

## 2018-10-05 ENCOUNTER — Other Ambulatory Visit: Payer: Self-pay

## 2018-10-05 DIAGNOSIS — Z1159 Encounter for screening for other viral diseases: Secondary | ICD-10-CM | POA: Diagnosis present

## 2018-10-05 NOTE — Telephone Encounter (Signed)

## 2018-10-06 ENCOUNTER — Encounter: Payer: Self-pay | Admitting: Plastic Surgery

## 2018-10-06 ENCOUNTER — Ambulatory Visit (INDEPENDENT_AMBULATORY_CARE_PROVIDER_SITE_OTHER): Payer: Medicare Other | Admitting: Plastic Surgery

## 2018-10-06 VITALS — BP 129/80 | HR 74 | Temp 97.3°F | Ht 64.0 in | Wt 200.2 lb

## 2018-10-06 DIAGNOSIS — N62 Hypertrophy of breast: Secondary | ICD-10-CM | POA: Diagnosis not present

## 2018-10-06 DIAGNOSIS — G8929 Other chronic pain: Secondary | ICD-10-CM

## 2018-10-06 DIAGNOSIS — M549 Dorsalgia, unspecified: Secondary | ICD-10-CM | POA: Diagnosis not present

## 2018-10-06 DIAGNOSIS — Z86718 Personal history of other venous thrombosis and embolism: Secondary | ICD-10-CM | POA: Diagnosis not present

## 2018-10-06 DIAGNOSIS — M542 Cervicalgia: Secondary | ICD-10-CM

## 2018-10-06 LAB — SARS CORONAVIRUS 2 (TAT 6-24 HRS): SARS Coronavirus 2: NEGATIVE

## 2018-10-06 NOTE — Progress Notes (Signed)
Patient ID: Elizabeth Crawford, female    DOB: 1945-09-23, 73 y.o.   MRN: [id number]   Chief Complaint  Patient presents with  . Advice Only    for (B) breast reduction  . Breast Problem    Mammary Hyperplasia: The patient is a 73 y.o. female with a history of mammary hyperplasia for several years.  She has extremely large breasts causing symptoms that include the following: Back pain (upper and lower) and neck pain. She frequently pins bra cups higher on straps for better lift and relief. Notices relief when holding breast up in her hands. Shoulder straps causing grooves, pain occasionally requiring padding. Pain medication is sometimes required with motrin and tylenol.  Activities that are hindered by enlarged breasts include: Running and exercise.  Her breasts are extremely large and fairly symmetric.  She has hyperpigmentation of the inframammary area on both sides.  The sternal to nipple distance on the right is 33 cm and the left is 32 cm.  The nipple areola is 7 cm.  She is 5 feet 4 inches tall and weighs 200 pounds.  Preoperative bra size = 42 DD cup but would sit better in a 40 DDD.  The estimated excess breast tissue to be removed at the time of surgery = 650 grams on the left and 650 grams on the right.  Mammogram history: October 2019 and it was negative.   She tried but was not able to breast feed. She does not have a family history of breast cancer.   She has 3 children.  She is a former smoker but has not in years.  She also has a history of a deep venous thrombosis.  She does not appear to be on any anticoagulation but is somebody we would consider anticoagulating. Her surgical history is remarkable for 2 spine surgeries due to neck and back pain.  She was referred by her orthopedic spine surgeon due to the neck and back pain.  He believes that she would benefit markedly from a reduction.   Review of Systems  Constitutional: Positive for activity change. Negative for appetite  change.  HENT: Negative for congestion.   Eyes: Negative.   Respiratory: Negative for chest tightness and shortness of breath.   Cardiovascular: Positive for leg swelling.  Gastrointestinal: Negative for abdominal pain.  Endocrine: Negative.   Genitourinary: Negative.   Musculoskeletal: Positive for back pain and neck pain.  Skin: Positive for rash. Negative for color change, pallor and wound.  Neurological: Negative.   Hematological: Negative.   Psychiatric/Behavioral: Negative.     Past Medical History:  Diagnosis Date  . Allergy   . Arthritis   . Complication of anesthesia   . DVT (deep venous thrombosis) ([place])   . GERD (gastroesophageal reflux disease)   . H/O seasonal allergies   . PONV (postoperative nausea and vomiting)    Gets sick every time  . Pulmonary embolism ([place])   . Spondylolisthesis     Past Surgical History:  Procedure Laterality Date  . BLADDER SUSPENSION  2009  . CARPAL TUNNEL RELEASE  2003   Left hand  . CHOLECYSTECTOMY  2000   Laparoscopic  . COLONOSCOPY W/ POLYPECTOMY  2014  . COLONOSCOPY WITH PROPOFOL N/A 05/22/2017   Procedure: COLONOSCOPY WITH PROPOFOL;  Surgeon: Lollie Sails, MD;  Location: [facility] ENDOSCOPY;  Service: Endoscopy;  Laterality: N/A;  . DECOMPRESSIVE LUMBAR LAMINECTOMY LEVEL 2 N/A 11/09/2015   Procedure: DECOMPRESSION L2-5 REMOVAL PEDICULE SCREWS L4-5;  Surgeon:  Melina Schools, MD;  Location: [place];  Service: Orthopedics;  Laterality: N/A;  . EYE SURGERY Bilateral 2005   cataract surgery   . HARDWARE REMOVAL N/A 11/09/2015   Procedure: HARDWARE REMOVAL;  Surgeon: Melina Schools, MD;  Location: [place];  Service: Orthopedics;  Laterality: N/A;  . [place]  . IVC filter placement  02-01-10  . SPINE SURGERY  5 23-12   anterolateral fusion/ rodding  . TONSILLECTOMY    . TUBAL LIGATION  1977      Current Outpatient Medications:  .  acetaminophen (TYLENOL) 500 MG tablet, Take 1,000 mg by mouth every 6 (six) hours as  needed., Disp: , Rfl:  .  aspirin 325 MG tablet, Take 325 mg by mouth daily., Disp: , Rfl:  .  DIPHENHYDRAMINE HCL PO, Take by mouth daily., Disp: , Rfl:  .  Estradiol 10 MCG TABS vaginal tablet, Place vaginally., Disp: , Rfl:  .  loratadine (CLARITIN) 10 MG tablet, Take 10 mg by mouth daily., Disp: , Rfl:  .  omeprazole (PRILOSEC) 40 MG capsule, Take 40 mg by mouth daily., Disp: , Rfl:  .  rosuvastatin (CRESTOR) 5 MG tablet, Take by mouth., Disp: , Rfl:    Objective:   Vitals:   10/06/18 1051  BP: 129/80  Pulse: 74  Temp: (!) 97.3 F (36.3 C)  SpO2: 96%    Physical Exam Vitals signs and nursing note reviewed.  Constitutional:      Appearance: Normal appearance.  HENT:     Head: Normocephalic and atraumatic.  Eyes:     Extraocular Movements: Extraocular movements intact.     Pupils: Pupils are equal, round, and reactive to light.  Cardiovascular:     Rate and Rhythm: Normal rate.     Pulses: Normal pulses.  Pulmonary:     Effort: Pulmonary effort is normal. No respiratory distress.  Abdominal:     General: Abdomen is flat. There is no distension.     Tenderness: There is no abdominal tenderness. There is no guarding.  Musculoskeletal:        General: Swelling present.  Skin:    General: Skin is warm.  Neurological:     General: No focal deficit present.     Mental Status: She is alert and oriented to person, place, and time.  Psychiatric:        Mood and Affect: Mood normal.        Behavior: Behavior normal.        Thought Content: Thought content normal.        Judgment: Judgment normal.     Assessment & Plan:     ICD-10-CM   1. Chronic back pain, unspecified back location, unspecified back pain laterality  M54.9    G89.29   2. Neck pain  M54.2   3. Symptomatic mammary hypertrophy  N62   4. History of DVT (deep vein thrombosis)  Z86.718     Pictures were obtained of the patient and placed in the chart with the patient's or guardian's permission.   Recommend bilateral breast reduction with liposuction.  [place], DO

## 2018-10-09 ENCOUNTER — Ambulatory Visit: Payer: Medicare Other | Admitting: Certified Registered Nurse Anesthetist

## 2018-10-09 ENCOUNTER — Other Ambulatory Visit: Payer: Self-pay

## 2018-10-09 ENCOUNTER — Ambulatory Visit
Admission: RE | Admit: 2018-10-09 | Discharge: 2018-10-09 | Disposition: A | Discharge status: Home / Self Care | Payer: Medicare Other | Attending: Gastroenterology | Admitting: Gastroenterology

## 2018-10-09 ENCOUNTER — Encounter: Payer: Self-pay | Admitting: *Deleted

## 2018-10-09 ENCOUNTER — Encounter
Admission: RE | Disposition: A | Discharge status: Home / Self Care | Payer: Self-pay | Source: Home / Self Care | Attending: Gastroenterology

## 2018-10-09 DIAGNOSIS — Z86718 Personal history of other venous thrombosis and embolism: Secondary | ICD-10-CM | POA: Diagnosis not present

## 2018-10-09 DIAGNOSIS — M1990 Unspecified osteoarthritis, unspecified site: Secondary | ICD-10-CM | POA: Diagnosis not present

## 2018-10-09 DIAGNOSIS — K2950 Unspecified chronic gastritis without bleeding: Secondary | ICD-10-CM | POA: Diagnosis not present

## 2018-10-09 DIAGNOSIS — Z87891 Personal history of nicotine dependence: Secondary | ICD-10-CM | POA: Insufficient documentation

## 2018-10-09 DIAGNOSIS — Z79899 Other long term (current) drug therapy: Secondary | ICD-10-CM | POA: Diagnosis not present

## 2018-10-09 DIAGNOSIS — Z888 Allergy status to other drugs, medicaments and biological substances status: Secondary | ICD-10-CM | POA: Diagnosis not present

## 2018-10-09 DIAGNOSIS — Z86711 Personal history of pulmonary embolism: Secondary | ICD-10-CM | POA: Diagnosis not present

## 2018-10-09 DIAGNOSIS — K259 Gastric ulcer, unspecified as acute or chronic, without hemorrhage or perforation: Secondary | ICD-10-CM | POA: Diagnosis not present

## 2018-10-09 DIAGNOSIS — Z7951 Long term (current) use of inhaled steroids: Secondary | ICD-10-CM | POA: Insufficient documentation

## 2018-10-09 DIAGNOSIS — K449 Diaphragmatic hernia without obstruction or gangrene: Secondary | ICD-10-CM | POA: Diagnosis not present

## 2018-10-09 DIAGNOSIS — Z7982 Long term (current) use of aspirin: Secondary | ICD-10-CM | POA: Insufficient documentation

## 2018-10-09 DIAGNOSIS — Z882 Allergy status to sulfonamides status: Secondary | ICD-10-CM | POA: Insufficient documentation

## 2018-10-09 DIAGNOSIS — K219 Gastro-esophageal reflux disease without esophagitis: Secondary | ICD-10-CM | POA: Insufficient documentation

## 2018-10-09 DIAGNOSIS — G4730 Sleep apnea, unspecified: Secondary | ICD-10-CM | POA: Insufficient documentation

## 2018-10-09 DIAGNOSIS — K317 Polyp of stomach and duodenum: Secondary | ICD-10-CM | POA: Diagnosis not present

## 2018-10-09 DIAGNOSIS — K2270 Barrett's esophagus without dysplasia: Secondary | ICD-10-CM | POA: Diagnosis not present

## 2018-10-09 HISTORY — PX: ESOPHAGOGASTRODUODENOSCOPY (EGD) WITH PROPOFOL: SHX5813

## 2018-10-09 HISTORY — DX: Sleep apnea, unspecified: G47.30

## 2018-10-09 SURGERY — ESOPHAGOGASTRODUODENOSCOPY (EGD) WITH PROPOFOL
Anesthesia: General

## 2018-10-09 MED ORDER — SODIUM CHLORIDE 0.9 % IV SOLN
INTRAVENOUS | Status: DC
  Administered 2018-10-09: 10:00:00 via INTRAVENOUS

## 2018-10-09 MED ORDER — LIDOCAINE HCL (PF) 2 % IJ SOLN
INTRAMUSCULAR | Status: AC
  Filled 2018-10-09: qty 10

## 2018-10-09 MED ORDER — PROPOFOL 500 MG/50ML IV EMUL
INTRAVENOUS | Status: DC | PRN
  Administered 2018-10-09: 140 ug/kg/min via INTRAVENOUS

## 2018-10-09 MED ORDER — PROPOFOL 500 MG/50ML IV EMUL
INTRAVENOUS | Status: AC
  Filled 2018-10-09: qty 50

## 2018-10-09 MED ORDER — PROPOFOL 10 MG/ML IV BOLUS
INTRAVENOUS | Status: DC | PRN
  Administered 2018-10-09: 80 mg via INTRAVENOUS

## 2018-10-09 NOTE — H&P (Signed)
Outpatient short stay form Pre-procedure 10/09/2018 10:56 AM Elizabeth Sails MD  Primary Physician: Dr. [place] Pink, Dr. Alecia Lemming  Reason for visit: EGD  History of present illness: Patient is a 73 year old female presenting today for an EGD in regards her history of a large hiatal hernia.  Her surgeon is contemplating repair for this.  CT scan shows that its arch hiatal hernia with a paraesophageal component.  Fundus and body are both above the diaphragm.  She has been taking omeprazole.  She has had long-term issues with reflux.  She has held her 325 aspirin for about 6 days.    Current Facility-Administered Medications:  .  0.9 %  sodium chloride infusion, , Intravenous, Continuous, Elizabeth Sails, MD, Last Rate: 20 mL/hr at 10/09/18 1023  Medications Prior to Admission  Medication Sig Dispense Refill Last Dose  . acetaminophen (TYLENOL) 500 MG tablet Take 1,000 mg by mouth every 6 (six) hours as needed.   Past Week at Unknown time  . aspirin 325 MG tablet Take 325 mg by mouth daily.   Past Week at Unknown time  . budesonide (PULMICORT) 0.5 MG/2ML nebulizer solution Take 0.5 mg by nebulization 2 (two) times daily.   Past Week at Unknown time  . DIPHENHYDRAMINE HCL PO Take by mouth daily.   Past Week at Unknown time  . Estradiol 10 MCG TABS vaginal tablet Place vaginally.   Past Week at Unknown time  . loratadine (CLARITIN) 10 MG tablet Take 10 mg by mouth daily.   Past Week at Unknown time  . omeprazole (PRILOSEC) 40 MG capsule Take 40 mg by mouth daily.   10/08/2018 at 0730  . rosuvastatin (CRESTOR) 5 MG tablet Take by mouth.   10/08/2018 at 1800     Allergies  Allergen Reactions  . Lidocaine Other (See Comments)    UTI (polyuria, dysuria) and facial flushing  . Sulfa Antibiotics Other (See Comments)    UNSPECIFIED   . Methylprednisolone Palpitations  . Sulfasalazine Nausea Only  . Sulfur Nausea And Vomiting     Past Medical History:  Diagnosis Date  .  Allergy   . Arthritis   . Complication of anesthesia   . DVT (deep venous thrombosis) ([place])   . GERD (gastroesophageal reflux disease)   . H/O seasonal allergies   . PONV (postoperative nausea and vomiting)    Gets sick every time  . Pulmonary embolism ([place])   . Sleep apnea   . Spondylolisthesis     Review of systems:      Physical Exam    Heart and lungs: With and without rub or gallop lungs are bilaterally clear    HEENT: Normocephalic atraumatic eyes are anicteric    Other:    Pertinant exam for procedure: Soft nontender nondistended bowel sounds positive normoactive.    Planned proceedures: EGD and indicated procedures. I have discussed the risks benefits and complications of procedures to include not limited to bleeding, infection, perforation and the risk of sedation and the patient wishes to proceed.    Elizabeth Sails, MD Gastroenterology 10/09/2018  10:56 AM

## 2018-10-09 NOTE — Op Note (Signed)
[facility] Patient Name: Elizabeth Crawford Procedure Date: 10/09/2018 10:52 AM MRN: [id number] Account #: [id number] Date of Birth: January 11, 1946 Admit Type: Outpatient Age: 73 Room: [facility] ENDO ROOM 1 Gender: Female Note Status: Finalized Procedure:            Upper GI endoscopy Indications:          Gastro-esophageal reflux disease, Hiatal hernia Providers:            Lollie Sails, MD Referring MD:         Irven Easterly. Kary Kos, MD (Referring MD) Medicines:            Monitored Anesthesia Care Complications:        No immediate complications. Procedure:            Pre-Anesthesia Assessment:                       - ASA Grade Assessment: III - A patient with severe                        systemic disease.                       After obtaining informed consent, the endoscope was                        passed under direct vision. Throughout the procedure,                        the patient's blood pressure, pulse, and oxygen                        saturations were monitored continuously. The Endoscope                        was introduced through the mouth, and advanced to the                        third part of duodenum. The upper GI endoscopy was                        accomplished without difficulty. The patient tolerated                        the procedure well. Findings:      The Z-line was regular and was found 29 cm from the incisors.      A medium-sized hiatal hernia was present. The diaphragmatic hiatus is       about 35-36 cm from the GE junction.      A medium-sized hiatal hernia with multiple Cameron ulcers was found. The       proximal extent of the gastric folds (end of tubular esophagus) was 35       cm from the incisors. The Z-line was a variable distance from incisors;       the hiatal hernia was sliding.      Multiple 1 to 3 mm sessile polyps with no bleeding and no stigmata of       recent bleeding were found in the cardia, in the  gastric body and in the       gastric antrum. Biopsies were taken with a cold forceps for histology.  The cardia and gastric fundus were normal on retroflexion otherwise       however the upper portion of the portion of the stomach in the hernia       could only be partly seen in retroflex, more fully in forward view.      The in the duodenum was normal. Impression:           - Z-line regular, 29 cm from the incisors.                       - Medium-sized hiatal hernia.                       - Medium-sized hiatal hernia with multiple Cameron                        ulcers.                       - Multiple gastric polyps. Biopsied.                       - Normal. Recommendation:       - Discharge patient to home.                       - Perform/consider routine esophageal manometry at                        appointment to be scheduled. Procedure Code(s):    --- Professional ---                       [phone], Esophagogastroduodenoscopy, flexible, transoral;                        with biopsy, single or multiple Diagnosis Code(s):    --- Professional ---                       K44.9, Diaphragmatic hernia without obstruction or                        gangrene                       K25.9, Gastric ulcer, unspecified as acute or chronic,                        without hemorrhage or perforation                       K31.7, Polyp of stomach and duodenum                       K21.9, Gastro-esophageal reflux disease without                        esophagitis CPT copyright 2019 American Medical Association. All rights reserved. The codes documented in this report are preliminary and upon coder review may  be revised to meet current compliance requirements. Lollie Sails, MD 10/09/2018 11:22:43 AM This report has been signed electronically. Number of Addenda: 0 Note Initiated On: 10/09/2018 10:52 AM      [facility]

## 2018-10-09 NOTE — Anesthesia Post-op Follow-up Note (Signed)
Anesthesia QCDR form completed.        

## 2018-10-09 NOTE — Transfer of Care (Addendum)
Immediate Anesthesia Transfer of Care Note  Patient: Elizabeth Crawford  Procedure(s) Performed: ESOPHAGOGASTRODUODENOSCOPY (EGD) WITH PROPOFOL (N/A )  Patient Location: PACU and Endoscopy Unit  Anesthesia Type:General  Level of Consciousness: drowsy  Airway & Oxygen Therapy: Patient Spontanous Breathing and Patient connected to nasal cannula oxygen  Post-op Assessment: Report given to RN and Post -op Vital signs reviewed and stable  Post vital signs: Reviewed and stable  Last Vitals:  Vitals Value Taken Time  BP 102/60 09/23/18 1118  Temp    Pulse 73 10/09/18 1118  Resp 17 10/09/18 1118  SpO2 91 % 10/09/18 1118  Vitals shown include unvalidated device data.  Last Pain:  Vitals:   10/09/18 1118  TempSrc: (P) Tympanic  PainSc:          Complications: No apparent anesthesia complications

## 2018-10-09 NOTE — Anesthesia Preprocedure Evaluation (Signed)
Anesthesia Evaluation  Patient identified by MRN, date of birth, ID band Patient awake    Reviewed: Allergy & Precautions, H&P , NPO status , Patient's Chart, lab work & pertinent test results  History of Anesthesia Complications (+) PONV and history of anesthetic complications  Airway Mallampati: I  TM Distance: <3 FB Neck ROM: limited    Dental  (+) Chipped   Pulmonary neg shortness of breath, sleep apnea and Continuous Positive Airway Pressure Ventilation , former smoker,           Cardiovascular Exercise Tolerance: Good (-) angina(-) Past MI and (-) DOE negative cardio ROS       Neuro/Psych negative neurological ROS  negative psych ROS   GI/Hepatic Neg liver ROS, GERD  Medicated and Controlled,  Endo/Other  negative endocrine ROS  Renal/GU negative Renal ROS  negative genitourinary   Musculoskeletal  (+) Arthritis ,   Abdominal   Peds  Hematology negative hematology ROS (+)   Anesthesia Other Findings Past Medical History: No date: Allergy No date: Arthritis No date: Complication of anesthesia No date: DVT (deep venous thrombosis) ([facility]) No date: GERD (gastroesophageal reflux disease) No date: H/O seasonal allergies No date: PONV (postoperative nausea and vomiting)     Comment:  Gets sick every time No date: Pulmonary embolism ([place]) No date: Sleep apnea No date: Spondylolisthesis  Past Surgical History: 2009: BLADDER SUSPENSION 2003: CARPAL TUNNEL RELEASE     Comment:  Left hand 2000: CHOLECYSTECTOMY     Comment:  Laparoscopic 2014: COLONOSCOPY W/ POLYPECTOMY 05/22/2017: COLONOSCOPY WITH PROPOFOL; N/A     Comment:  Procedure: COLONOSCOPY WITH PROPOFOL;  Surgeon:               Lollie Sails, MD;  Location: [facility] ENDOSCOPY;                Service: Endoscopy;  Laterality: N/A; 11/09/2015: DECOMPRESSIVE LUMBAR LAMINECTOMY LEVEL 2; N/A     Comment:  Procedure: DECOMPRESSION L2-5 REMOVAL PEDICULE  SCREWS               L4-5;  Surgeon: Melina Schools, MD;  Location: [place];                Service: Orthopedics;  Laterality: N/A; 2005: EYE SURGERY; Bilateral     Comment:  cataract surgery  11/09/2015: HARDWARE REMOVAL; N/A     Comment:  Procedure: HARDWARE REMOVAL;  Surgeon: Melina Schools,               MD;  Location: [place];  Service: Orthopedics;  Laterality:              N/A; 1978: HEMORRHOID SURGERY 02-01-10: IVC filter placement 5 23-12: SPINE SURGERY     Comment:  anterolateral fusion/ rodding No date: TONSILLECTOMY 1977: TUBAL LIGATION  BMI    Body Mass Index: 34.36 kg/m      Reproductive/Obstetrics negative OB ROS                             Anesthesia Physical Anesthesia Plan  ASA: III  Anesthesia Plan: General   Post-op Pain Management:    Induction: Intravenous  PONV Risk Score and Plan: Propofol infusion and TIVA  Airway Management Planned: Natural Airway and Nasal Cannula  Additional Equipment:   Intra-op Plan:   Post-operative Plan:   Informed Consent: I have reviewed the patients History and Physical, chart, labs and discussed the procedure including the risks, benefits  and alternatives for the proposed anesthesia with the patient or authorized representative who has indicated his/her understanding and acceptance.     Dental Advisory Given  Plan Discussed with: Anesthesiologist, CRNA and Surgeon  Anesthesia Plan Comments: (Patient consented for risks of anesthesia including but not limited to:  - adverse reactions to medications - risk of intubation if required - damage to teeth, lips or other oral mucosa - sore throat or hoarseness - Damage to heart, brain, lungs or loss of life  Patient voiced understanding.)        Anesthesia Quick Evaluation

## 2018-10-09 NOTE — Anesthesia Postprocedure Evaluation (Signed)
Anesthesia Post Note  Patient: Elizabeth Crawford  Procedure(s) Performed: ESOPHAGOGASTRODUODENOSCOPY (EGD) WITH PROPOFOL (N/A )  Patient location during evaluation: Endoscopy Anesthesia Type: General Level of consciousness: awake and alert Pain management: pain level controlled Vital Signs Assessment: post-procedure vital signs reviewed and stable Respiratory status: spontaneous breathing, nonlabored ventilation, respiratory function stable and patient connected to nasal cannula oxygen Cardiovascular status: blood pressure returned to baseline and stable Postop Assessment: no apparent nausea or vomiting Anesthetic complications: no     Last Vitals:  Vitals:   10/09/18 1118 10/09/18 1138  BP: 102/60 112/72  Pulse:    Resp:    Temp: (!) 36.1 C   SpO2: 92%     Last Pain:  Vitals:   10/09/18 1138  TempSrc:   PainSc: 0-No pain                 Precious Haws [name]

## 2018-10-12 ENCOUNTER — Encounter: Payer: Self-pay | Admitting: Gastroenterology

## 2018-10-12 ENCOUNTER — Other Ambulatory Visit: Payer: Self-pay

## 2018-10-12 ENCOUNTER — Ambulatory Visit (INDEPENDENT_AMBULATORY_CARE_PROVIDER_SITE_OTHER): Payer: Medicare Other | Admitting: Surgery

## 2018-10-12 VITALS — BP 137/96 | HR 82 | Temp 96.6°F | Ht 64.0 in | Wt 199.0 lb

## 2018-10-12 DIAGNOSIS — K449 Diaphragmatic hernia without obstruction or gangrene: Secondary | ICD-10-CM | POA: Diagnosis not present

## 2018-10-12 NOTE — Patient Instructions (Signed)
Patient's surgery to be scheduled for 10/27/18 at [facility] with Dr. Dahlia Byes. Dr Genevive Bi will be assisting.   The patient is aware she will need to Pre-Admit. This is requested for 10/23/18. Patient will check in at the [facility] entrance due to COVID-19 restrictions and will then be escorted to the [place], [address] (first floor). Patient will be contacted once Pre-admission appointment has been arranged with date and time.    Patient aware to be NPO after midnight and have a driver.   She is aware to check in at the [place] entrance where she will be screened for the coronavirus and then sent to Same Day Surgery.   The patient verbalizes understanding of the above.   The patient is aware to call the office should she have further questions.

## 2018-10-13 ENCOUNTER — Telehealth: Payer: Self-pay | Admitting: *Deleted

## 2018-10-13 LAB — SURGICAL PATHOLOGY

## 2018-10-13 NOTE — Telephone Encounter (Signed)
Patient contacted today and notified that surgery has been scheduled for 10-27-18.   The patient is aware to Pre-admit and have COVID testing done on 10-23-18 at 10 am.

## 2018-10-13 NOTE — H&P (View-Only) (Signed)
Outpatient Surgical Follow Up  10/13/2018  Elizabeth Crawford is an 73 y.o. female.   Chief Complaint  Patient presents with  . Follow-up    HPI: Elizabeth Crawford is a 73 year old following up after recently being diagnosed with a large hiatal hernia.  She does have significant reflux symptoms daily.  He denies any dysphasia.  He did have a barium swallow that I have personally reviewed showing evidence of a large hiatal hernia with at least half of her stomach within the mediastinum.  No malrotation.  There is evidence of reflux.  I have also share with the patient and the family the images as well as the CT scan findings. She is able to perform more than 4 METS of activity without any shortness of breath or chest pain.  Past Medical History:  Diagnosis Date  . Allergy   . Arthritis   . Complication of anesthesia   . DVT (deep venous thrombosis) ([place])   . GERD (gastroesophageal reflux disease)   . H/O seasonal allergies   . PONV (postoperative nausea and vomiting)    Gets sick every time  . Pulmonary embolism ([place])   . Sleep apnea   . Spondylolisthesis     Past Surgical History:  Procedure Laterality Date  . BLADDER SUSPENSION  2009  . CARPAL TUNNEL RELEASE  2003   Left hand  . CHOLECYSTECTOMY  2000   Laparoscopic  . COLONOSCOPY W/ POLYPECTOMY  2014  . COLONOSCOPY WITH PROPOFOL N/A 05/22/2017   Procedure: COLONOSCOPY WITH PROPOFOL;  Surgeon: Lollie Sails, MD;  Location: [facility] ENDOSCOPY;  Service: Endoscopy;  Laterality: N/A;  . DECOMPRESSIVE LUMBAR LAMINECTOMY LEVEL 2 N/A 11/09/2015   Procedure: DECOMPRESSION L2-5 REMOVAL PEDICULE SCREWS L4-5;  Surgeon: Melina Schools, MD;  Location: [place];  Service: Orthopedics;  Laterality: N/A;  . ESOPHAGOGASTRODUODENOSCOPY (EGD) WITH PROPOFOL N/A 10/09/2018   Procedure: ESOPHAGOGASTRODUODENOSCOPY (EGD) WITH PROPOFOL;  Surgeon: Lollie Sails, MD;  Location: [facility] ENDOSCOPY;  Service: Endoscopy;  Laterality: N/A;  . EYE SURGERY Bilateral 2005   cataract surgery   . HARDWARE REMOVAL N/A 11/09/2015   Procedure: HARDWARE REMOVAL;  Surgeon: Melina Schools, MD;  Location: [place];  Service: Orthopedics;  Laterality: N/A;  . [place]  . IVC filter placement  02-01-10  . SPINE SURGERY  5 23-12   anterolateral fusion/ rodding  . TONSILLECTOMY    . TUBAL LIGATION  1977    Family History  Problem Relation Age of Onset  . COPD Mother   . Heart disease Mother   . Aortic aneurysm Mother   . Heart disease Father     Social History:  reports that she quit smoking about 42 years ago. Her smoking use included cigarettes. She quit after 15.00 years of use. She has never used smokeless tobacco. She reports current alcohol use. She reports that she does not use drugs.  Allergies:  Allergies  Allergen Reactions  . Lidocaine Other (See Comments)    UTI (polyuria, dysuria) and facial flushing  . Sulfa Antibiotics Other (See Comments)    UNSPECIFIED   . Methylprednisolone Palpitations  . Sulfasalazine Nausea Only  . Sulfur Nausea And Vomiting    Medications reviewed.    ROS Full ROS performed and is otherwise negative other than what is stated in HPI   BP (!) 137/96   Pulse 82   Temp (!) 96.6 F (35.9 C) (Skin)   Ht 5\' 4"  (1.626 m)   Wt 199 lb (90.3 kg)  SpO2 96%   BMI 34.16 kg/m   Physical Exam Vitals signs and nursing note reviewed.  Constitutional:      General: She is not in acute distress.    Appearance: Normal appearance. She is normal weight.  Eyes:     General:        Right eye: No discharge.        Left eye: No discharge.  Neck:     Musculoskeletal: Normal range of motion and neck supple. No muscular tenderness.  Cardiovascular:     Rate and Rhythm: Normal rate and regular rhythm.     Heart sounds: No murmur.  Pulmonary:     Effort: Pulmonary effort is normal. No respiratory distress.     Breath sounds: Normal breath sounds. No stridor.  Abdominal:     General: Abdomen is flat. There is no  distension.     Palpations: Abdomen is soft. There is no mass.     Tenderness: There is no abdominal tenderness. There is no guarding or rebound.     Hernia: No hernia is present.  Neurological:     General: No focal deficit present.     Mental Status: She is alert and oriented to person, place, and time.  Psychiatric:        Mood and Affect: Mood normal.        Behavior: Behavior normal.        Thought Content: Thought content normal.        Judgment: Judgment normal.      Assessment/Plan: Large symptomatic hiatal hernia with reflux symptoms.  Discussed with the patient in detail regarding the proposed surgery.  I do think that she is a great candidate for robotic hernia repair with fundoplication.  Procedure discussed with the patient detail.  Risk benefits and possible complications including but not limited to: Bleeding, infection, dysphasia, esophageal perforation, recurrence extensive counseling provided.  All questions were answered.  She understands and wishes to proceed Greater than 50% of the 40 minutes  visit was spent in counseling/coordination of care   Caroleen Hamman, MD [place] [facility]

## 2018-10-13 NOTE — Progress Notes (Addendum)
Outpatient Surgical Follow Up  10/13/2018  Elizabeth Crawford is an 73 y.o. female.   Chief Complaint  Patient presents with  . Follow-up    HPI: Elizabeth Crawford is a 73 year old following up after recently being diagnosed with a large hiatal hernia.  She does have significant reflux symptoms daily.  He denies any dysphasia.  He did have a barium swallow that I have personally reviewed showing evidence of a large hiatal hernia with at least half of her stomach within the mediastinum.  No malrotation.  There is evidence of reflux.  I have also share with the patient and the family the images as well as the CT scan findings. She is able to perform more than 4 METS of activity without any shortness of breath or chest pain.  Past Medical History:  Diagnosis Date  . Allergy   . Arthritis   . Complication of anesthesia   . DVT (deep venous thrombosis) ([place])   . GERD (gastroesophageal reflux disease)   . H/O seasonal allergies   . PONV (postoperative nausea and vomiting)    Gets sick every time  . Pulmonary embolism ([place])   . Sleep apnea   . Spondylolisthesis     Past Surgical History:  Procedure Laterality Date  . BLADDER SUSPENSION  2009  . CARPAL TUNNEL RELEASE  2003   Left hand  . CHOLECYSTECTOMY  2000   Laparoscopic  . COLONOSCOPY W/ POLYPECTOMY  2014  . COLONOSCOPY WITH PROPOFOL N/A 05/22/2017   Procedure: COLONOSCOPY WITH PROPOFOL;  Surgeon: Lollie Sails, MD;  Location: [facility] ENDOSCOPY;  Service: Endoscopy;  Laterality: N/A;  . DECOMPRESSIVE LUMBAR LAMINECTOMY LEVEL 2 N/A 11/09/2015   Procedure: DECOMPRESSION L2-5 REMOVAL PEDICULE SCREWS L4-5;  Surgeon: Melina Schools, MD;  Location: [place];  Service: Orthopedics;  Laterality: N/A;  . ESOPHAGOGASTRODUODENOSCOPY (EGD) WITH PROPOFOL N/A 10/09/2018   Procedure: ESOPHAGOGASTRODUODENOSCOPY (EGD) WITH PROPOFOL;  Surgeon: Lollie Sails, MD;  Location: [facility] ENDOSCOPY;  Service: Endoscopy;  Laterality: N/A;  . EYE SURGERY Bilateral 2005   cataract surgery   . HARDWARE REMOVAL N/A 11/09/2015   Procedure: HARDWARE REMOVAL;  Surgeon: Melina Schools, MD;  Location: [place];  Service: Orthopedics;  Laterality: N/A;  . [place]  . IVC filter placement  02-01-10  . SPINE SURGERY  5 23-12   anterolateral fusion/ rodding  . TONSILLECTOMY    . TUBAL LIGATION  1977    Family History  Problem Relation Age of Onset  . COPD Mother   . Heart disease Mother   . Aortic aneurysm Mother   . Heart disease Father     Social History:  reports that she quit smoking about 42 years ago. Her smoking use included cigarettes. She quit after 15.00 years of use. She has never used smokeless tobacco. She reports current alcohol use. She reports that she does not use drugs.  Allergies:  Allergies  Allergen Reactions  . Lidocaine Other (See Comments)    UTI (polyuria, dysuria) and facial flushing  . Sulfa Antibiotics Other (See Comments)    UNSPECIFIED   . Methylprednisolone Palpitations  . Sulfasalazine Nausea Only  . Sulfur Nausea And Vomiting    Medications reviewed.    ROS Full ROS performed and is otherwise negative other than what is stated in HPI   BP (!) 137/96   Pulse 82   Temp (!) 96.6 F (35.9 C) (Skin)   Ht 5\' 4"  (1.626 m)   Wt 199 lb (90.3 kg)  SpO2 96%   BMI 34.16 kg/m   Physical Exam Vitals signs and nursing note reviewed.  Constitutional:      General: She is not in acute distress.    Appearance: Normal appearance. She is normal weight.  Eyes:     General:        Right eye: No discharge.        Left eye: No discharge.  Neck:     Musculoskeletal: Normal range of motion and neck supple. No muscular tenderness.  Cardiovascular:     Rate and Rhythm: Normal rate and regular rhythm.     Heart sounds: No murmur.  Pulmonary:     Effort: Pulmonary effort is normal. No respiratory distress.     Breath sounds: Normal breath sounds. No stridor.  Abdominal:     General: Abdomen is flat. There is no  distension.     Palpations: Abdomen is soft. There is no mass.     Tenderness: There is no abdominal tenderness. There is no guarding or rebound.     Hernia: No hernia is present.  Neurological:     General: No focal deficit present.     Mental Status: She is alert and oriented to person, place, and time.  Psychiatric:        Mood and Affect: Mood normal.        Behavior: Behavior normal.        Thought Content: Thought content normal.        Judgment: Judgment normal.      Assessment/Plan: Large symptomatic hiatal hernia with reflux symptoms.  Discussed with the patient in detail regarding the proposed surgery.  I do think that she is a great candidate for robotic hernia repair with fundoplication.  Procedure discussed with the patient detail.  Risk benefits and possible complications including but not limited to: Bleeding, infection, dysphasia, esophageal perforation, recurrence extensive counseling provided.  All questions were answered.  She understands and wishes to proceed Greater than 50% of the 40 minutes  visit was spent in counseling/coordination of care   Caroleen Hamman, MD [place] Surgeon

## 2018-10-23 ENCOUNTER — Other Ambulatory Visit: Payer: Self-pay

## 2018-10-23 ENCOUNTER — Encounter
Admission: RE | Admit: 2018-10-23 | Discharge: 2018-10-23 | Disposition: A | Discharge status: Home / Self Care | Payer: Medicare Other | Source: Ambulatory Visit | Attending: Surgery | Admitting: Surgery

## 2018-10-23 DIAGNOSIS — Z20828 Contact with and (suspected) exposure to other viral communicable diseases: Secondary | ICD-10-CM | POA: Diagnosis not present

## 2018-10-23 DIAGNOSIS — Z01818 Encounter for other preprocedural examination: Secondary | ICD-10-CM | POA: Insufficient documentation

## 2018-10-23 LAB — SARS CORONAVIRUS 2 (TAT 6-24 HRS): SARS Coronavirus 2: NEGATIVE

## 2018-10-23 LAB — CBC
HCT: 38.8 % (ref 36.0–46.0)
Hemoglobin: 12.5 g/dL (ref 12.0–15.0)
MCH: 31.1 pg (ref 26.0–34.0)
MCHC: 32.2 g/dL (ref 30.0–36.0)
MCV: 96.5 fL (ref 80.0–100.0)
Platelets: 157 10*3/uL (ref 150–400)
RBC: 4.02 MIL/uL (ref 3.87–5.11)
RDW: 12.9 % (ref 11.5–15.5)
WBC: 7.1 10*3/uL (ref 4.0–10.5)
nRBC: 0 % (ref 0.0–0.2)

## 2018-10-23 NOTE — Patient Instructions (Signed)
Your procedure is scheduled on: Tuesday 10/27/18 Report to [facility]. To find out your arrival time please call [phone] between 1PM - 3PM on Monday 10/26/18.  Remember: Instructions that are not followed completely may result in serious medical risk, up to and including death, or upon the discretion of your surgeon and anesthesiologist your surgery may need to be rescheduled.     _X__ 1. Do not eat food after midnight the night before your procedure.                 No gum chewing or hard candies. You may drink clear liquids up to 2 hours                 before you are scheduled to arrive for your surgery- DO not drink clear                 liquids within 2 hours of the start of your surgery.                 Clear Liquids include:  water, apple juice without pulp, clear carbohydrate                 drink such as Clearfast or Gatorade, Black Coffee or Tea (Do not add                 anything to coffee or tea). Diabetics water only  __X__2.  On the morning of surgery brush your teeth with toothpaste and water, you                 may rinse your mouth with mouthwash if you wish.  Do not swallow any              toothpaste of mouthwash.     _X__ 3.  No Alcohol for 24 hours before or after surgery.   _X__ 4.  Do Not Smoke or use e-cigarettes For 24 Hours Prior to Your Surgery.                 Do not use any chewable tobacco products for at least 6 hours prior to                 surgery.  ____  5.  Bring all medications with you on the day of surgery if instructed.   __X__  6.  Notify your doctor if there is any change in your medical condition      (cold, fever, infections).     Do not wear jewelry, make-up, hairpins, clips or nail polish. Do not wear lotions, powders, or perfumes.  Do not shave 48 hours prior to surgery. Men may shave face and neck. Do not bring valuables to the hospital.    [facility] is not responsible for any  belongings or valuables.  Contacts, dentures/partials or body piercings may not be worn into surgery. Bring a case for your contacts, glasses or hearing aids, a denture cup will be supplied. Leave your suitcase in the car. After surgery it may be brought to your room. For patients admitted to the hospital, discharge time is determined by your treatment team.   Patients discharged the day of surgery will not be allowed to drive home.   Please read over the following fact sheets that you were given:   MRSA Information  __X__ Take these medicines the morning of surgery with A SIP OF WATER:  1. loratadine (CLARITIN)  2. omeprazole (PRILOSEC  3.   4.  5.  6.  ____ Fleet Enema (as directed)   __X__ Use CHG Soap/SAGE wipes as directed  __X__ Use inhalers on the day of surgery NEBULIZER TREATMENT BEFORE ARRIVAL DAY OF SURGERY  ____ Stop metformin/Janumet/Farxiga 2 days prior to surgery    ____ Take 1/2 of usual insulin dose the night before surgery. No insulin the morning          of surgery.   ____ Stop Blood Thinners Coumadin/Plavix/Xarelto/Pleta/Pradaxa/Eliquis/Effient/Aspirin  on   Or contact your Surgeon, Cardiologist or Medical Doctor regarding  ability to stop your blood thinners  __X__ Stop Anti-inflammatories 7 days before surgery such as Advil, Ibuprofen, Motrin,  BC or Goodies Powder, Naprosyn, Naproxen, Aleve, Aspirin    __X__ Stop all herbal supplements, fish oil or vitamin E until after surgery.    ____ Bring C-Pap to the hospital.

## 2018-10-23 NOTE — Pre-Procedure Instructions (Signed)
EKG reviewed by DR Piscitello. OK to proceed, no new orders.

## 2018-10-26 ENCOUNTER — Encounter: Payer: Self-pay | Admitting: Anesthesiology

## 2018-10-26 ENCOUNTER — Telehealth: Payer: Self-pay

## 2018-10-26 NOTE — Telephone Encounter (Signed)
Labs / covid normal. Patient notified.

## 2018-10-27 ENCOUNTER — Observation Stay
Admission: RE | Admit: 2018-10-27 | Discharge: 2018-10-28 | Disposition: A | Discharge status: Home / Self Care | Payer: Medicare Other | Attending: Surgery | Admitting: Surgery

## 2018-10-27 ENCOUNTER — Inpatient Hospital Stay: Payer: Medicare Other | Admitting: Anesthesiology

## 2018-10-27 ENCOUNTER — Encounter
Admission: RE | Disposition: A | Discharge status: Home / Self Care | Payer: Self-pay | Source: Home / Self Care | Attending: Surgery

## 2018-10-27 ENCOUNTER — Inpatient Hospital Stay: Payer: Medicare Other

## 2018-10-27 ENCOUNTER — Other Ambulatory Visit: Payer: Self-pay

## 2018-10-27 ENCOUNTER — Encounter: Payer: Self-pay | Admitting: *Deleted

## 2018-10-27 DIAGNOSIS — Z87891 Personal history of nicotine dependence: Secondary | ICD-10-CM | POA: Diagnosis not present

## 2018-10-27 DIAGNOSIS — Z86711 Personal history of pulmonary embolism: Secondary | ICD-10-CM | POA: Diagnosis not present

## 2018-10-27 DIAGNOSIS — K449 Diaphragmatic hernia without obstruction or gangrene: Secondary | ICD-10-CM

## 2018-10-27 DIAGNOSIS — G4730 Sleep apnea, unspecified: Secondary | ICD-10-CM | POA: Diagnosis not present

## 2018-10-27 DIAGNOSIS — Z9889 Other specified postprocedural states: Secondary | ICD-10-CM

## 2018-10-27 DIAGNOSIS — Z8719 Personal history of other diseases of the digestive system: Secondary | ICD-10-CM

## 2018-10-27 DIAGNOSIS — Z86718 Personal history of other venous thrombosis and embolism: Secondary | ICD-10-CM | POA: Diagnosis not present

## 2018-10-27 LAB — CBC
HCT: 37.1 % (ref 36.0–46.0)
Hemoglobin: 12 g/dL (ref 12.0–15.0)
MCH: 30.8 pg (ref 26.0–34.0)
MCHC: 32.3 g/dL (ref 30.0–36.0)
MCV: 95.1 fL (ref 80.0–100.0)
Platelets: 149 10*3/uL — ABNORMAL LOW (ref 150–400)
RBC: 3.9 MIL/uL (ref 3.87–5.11)
RDW: 12.7 % (ref 11.5–15.5)
WBC: 10.5 10*3/uL (ref 4.0–10.5)
nRBC: 0 % (ref 0.0–0.2)

## 2018-10-27 LAB — CREATININE, SERUM
Creatinine, Ser: 0.88 mg/dL (ref 0.44–1.00)
GFR calc Af Amer: >60 mL/min (ref >60)
GFR calc non Af Amer: >60 mL/min (ref >60)

## 2018-10-27 SURGERY — FUNDOPLICATION, NISSEN, ROBOT-ASSISTED, LAPAROSCOPIC
Anesthesia: General

## 2018-10-27 MED ORDER — SUGAMMADEX SODIUM 200 MG/2ML IV SOLN
INTRAVENOUS | Status: DC | PRN
  Administered 2018-10-27: 200 mg via INTRAVENOUS

## 2018-10-27 MED ORDER — PROCHLORPERAZINE MALEATE 10 MG PO TABS
10.0000 mg | ORAL_TABLET | Freq: Four times a day (QID) | ORAL | Status: DC | PRN
  Filled 2018-10-27: qty 1

## 2018-10-27 MED ORDER — ONDANSETRON 4 MG PO TBDP: 4.0000 mg | ORAL_TABLET | Freq: Four times a day (QID) | ORAL | Status: DC | PRN

## 2018-10-27 MED ORDER — ACETAMINOPHEN 500 MG PO TABS
1000.0000 mg | ORAL_TABLET | ORAL | Status: AC
  Administered 2018-10-27: 1000 mg via ORAL

## 2018-10-27 MED ORDER — DEXAMETHASONE SODIUM PHOSPHATE 10 MG/ML IJ SOLN
INTRAMUSCULAR | Status: DC | PRN
  Administered 2018-10-27: 10 mg via INTRAVENOUS

## 2018-10-27 MED ORDER — LACTATED RINGERS IV SOLN
INTRAVENOUS | Status: DC
  Administered 2018-10-27 (×2): via INTRAVENOUS

## 2018-10-27 MED ORDER — PROCHLORPERAZINE EDISYLATE 10 MG/2ML IJ SOLN
5.0000 mg | Freq: Four times a day (QID) | INTRAMUSCULAR | Status: DC | PRN
  Filled 2018-10-27: qty 2

## 2018-10-27 MED ORDER — FENTANYL CITRATE (PF) 100 MCG/2ML IJ SOLN
INTRAMUSCULAR | Status: AC
  Filled 2018-10-27: qty 2

## 2018-10-27 MED ORDER — OXYCODONE HCL 5 MG PO TABS: 5.0000 mg | ORAL_TABLET | ORAL | Status: DC | PRN

## 2018-10-27 MED ORDER — FENTANYL CITRATE (PF) 100 MCG/2ML IJ SOLN
INTRAMUSCULAR | Status: DC | PRN
  Administered 2018-10-27 (×4): 50 ug via INTRAVENOUS

## 2018-10-27 MED ORDER — HYDRALAZINE HCL 20 MG/ML IJ SOLN: 10.0000 mg | INTRAMUSCULAR | Status: DC | PRN

## 2018-10-27 MED ORDER — CHLORHEXIDINE GLUCONATE CLOTH 2 % EX PADS: 6.0000 | MEDICATED_PAD | Freq: Once | CUTANEOUS | Status: DC

## 2018-10-27 MED ORDER — BUPIVACAINE-EPINEPHRINE (PF) 0.25% -1:200000 IJ SOLN
INTRAMUSCULAR | Status: AC
  Filled 2018-10-27: qty 30

## 2018-10-27 MED ORDER — SODIUM CHLORIDE 0.9 % IV SOLN
INTRAVENOUS | Status: DC | PRN
  Administered 2018-10-27: 50 ug/min via INTRAVENOUS

## 2018-10-27 MED ORDER — ROCURONIUM BROMIDE 100 MG/10ML IV SOLN
INTRAVENOUS | Status: DC | PRN
  Administered 2018-10-27: 30 mg via INTRAVENOUS
  Administered 2018-10-27: 20 mg via INTRAVENOUS
  Administered 2018-10-27: 30 mg via INTRAVENOUS
  Administered 2018-10-27: 20 mg via INTRAVENOUS

## 2018-10-27 MED ORDER — DIPHENHYDRAMINE HCL 12.5 MG/5ML PO ELIX
12.5000 mg | ORAL_SOLUTION | Freq: Four times a day (QID) | ORAL | Status: DC | PRN
  Filled 2018-10-27: qty 5

## 2018-10-27 MED ORDER — SODIUM CHLORIDE 0.9 % IV SOLN
INTRAVENOUS | Status: DC
  Administered 2018-10-27: 13:00:00 via INTRAVENOUS

## 2018-10-27 MED ORDER — ENOXAPARIN SODIUM 40 MG/0.4ML ~~LOC~~ SOLN
40.0000 mg | SUBCUTANEOUS | Status: DC
  Filled 2018-10-27: qty 0.4

## 2018-10-27 MED ORDER — CEFAZOLIN SODIUM-DEXTROSE 2-4 GM/100ML-% IV SOLN
2.0000 g | INTRAVENOUS | Status: AC
  Administered 2018-10-27: 2 g via INTRAVENOUS

## 2018-10-27 MED ORDER — MIDAZOLAM HCL 2 MG/2ML IJ SOLN
INTRAMUSCULAR | Status: AC
  Filled 2018-10-27: qty 2

## 2018-10-27 MED ORDER — PROPOFOL 10 MG/ML IV BOLUS
INTRAVENOUS | Status: DC | PRN
  Administered 2018-10-27: 150 mg via INTRAVENOUS

## 2018-10-27 MED ORDER — SODIUM CHLORIDE FLUSH 0.9 % IV SOLN
INTRAVENOUS | Status: AC
  Filled 2018-10-27: qty 20

## 2018-10-27 MED ORDER — ONDANSETRON HCL 4 MG/2ML IJ SOLN
INTRAMUSCULAR | Status: DC | PRN
  Administered 2018-10-27: 4 mg via INTRAVENOUS

## 2018-10-27 MED ORDER — SUCCINYLCHOLINE CHLORIDE 20 MG/ML IJ SOLN
INTRAMUSCULAR | Status: DC | PRN
  Administered 2018-10-27: 100 mg via INTRAVENOUS

## 2018-10-27 MED ORDER — CEFAZOLIN SODIUM-DEXTROSE 2-4 GM/100ML-% IV SOLN
INTRAVENOUS | Status: AC
  Filled 2018-10-27: qty 100

## 2018-10-27 MED ORDER — ACETAMINOPHEN 500 MG PO TABS
1000.0000 mg | ORAL_TABLET | Freq: Four times a day (QID) | ORAL | Status: DC
  Administered 2018-10-27 – 2018-10-28 (×4): 1000 mg via ORAL
  Filled 2018-10-27 (×5): qty 2

## 2018-10-27 MED ORDER — BUPIVACAINE-EPINEPHRINE 0.25% -1:200000 IJ SOLN
INTRAMUSCULAR | Status: DC | PRN
  Administered 2018-10-27: 30 mL

## 2018-10-27 MED ORDER — BUPIVACAINE LIPOSOME 1.3 % IJ SUSP
INTRAMUSCULAR | Status: DC | PRN
  Administered 2018-10-27: 20 mL

## 2018-10-27 MED ORDER — MORPHINE SULFATE (PF) 2 MG/ML IV SOLN
2.0000 mg | INTRAVENOUS | Status: DC | PRN
  Administered 2018-10-27: 2 mg via INTRAVENOUS
  Filled 2018-10-27: qty 1

## 2018-10-27 MED ORDER — BUPIVACAINE LIPOSOME 1.3 % IJ SUSP
INTRAMUSCULAR | Status: AC
  Filled 2018-10-27: qty 20

## 2018-10-27 MED ORDER — KETOROLAC TROMETHAMINE 15 MG/ML IJ SOLN: 15.0000 mg | Freq: Four times a day (QID) | INTRAMUSCULAR | Status: DC | PRN

## 2018-10-27 MED ORDER — PHENYLEPHRINE HCL (PRESSORS) 10 MG/ML IV SOLN
INTRAVENOUS | Status: DC | PRN
  Administered 2018-10-27: 200 ug via INTRAVENOUS
  Administered 2018-10-27: 100 ug via INTRAVENOUS

## 2018-10-27 MED ORDER — LORATADINE 10 MG PO TABS
10.0000 mg | ORAL_TABLET | Freq: Every day | ORAL | Status: DC
  Administered 2018-10-28: 10 mg via ORAL
  Filled 2018-10-27: qty 1

## 2018-10-27 MED ORDER — ACETAMINOPHEN 500 MG PO TABS
ORAL_TABLET | ORAL | Status: AC
  Administered 2018-10-27: 1000 mg via ORAL
  Filled 2018-10-27: qty 2

## 2018-10-27 MED ORDER — PROPOFOL 10 MG/ML IV BOLUS
INTRAVENOUS | Status: AC
  Filled 2018-10-27: qty 20

## 2018-10-27 MED ORDER — EPHEDRINE SULFATE 50 MG/ML IJ SOLN
INTRAMUSCULAR | Status: DC | PRN
  Administered 2018-10-27: 10 mg via INTRAVENOUS

## 2018-10-27 MED ORDER — ONDANSETRON HCL 4 MG/2ML IJ SOLN: 4.0000 mg | Freq: Four times a day (QID) | INTRAMUSCULAR | Status: DC | PRN

## 2018-10-27 MED ORDER — ONDANSETRON HCL 4 MG/2ML IJ SOLN: 4.0000 mg | Freq: Once | INTRAMUSCULAR | Status: DC | PRN

## 2018-10-27 MED ORDER — GABAPENTIN 300 MG PO CAPS
300.0000 mg | ORAL_CAPSULE | ORAL | Status: AC
  Administered 2018-10-27: 300 mg via ORAL

## 2018-10-27 MED ORDER — BUDESONIDE 0.5 MG/2ML IN SUSP
0.5000 mg | Freq: Two times a day (BID) | RESPIRATORY_TRACT | Status: DC
  Administered 2018-10-27 – 2018-10-28 (×2): 0.5 mg via RESPIRATORY_TRACT
  Filled 2018-10-27 (×3): qty 2

## 2018-10-27 MED ORDER — MIDAZOLAM HCL 2 MG/2ML IJ SOLN
INTRAMUSCULAR | Status: DC | PRN
  Administered 2018-10-27: 2 mg via INTRAVENOUS

## 2018-10-27 MED ORDER — DIPHENHYDRAMINE HCL 50 MG/ML IJ SOLN: 12.5000 mg | Freq: Four times a day (QID) | INTRAMUSCULAR | Status: DC | PRN

## 2018-10-27 MED ORDER — GABAPENTIN 300 MG PO CAPS
ORAL_CAPSULE | ORAL | Status: AC
  Administered 2018-10-27: 300 mg via ORAL
  Filled 2018-10-27: qty 1

## 2018-10-27 MED ORDER — SCOPOLAMINE 1 MG/3DAYS TD PT72
MEDICATED_PATCH | TRANSDERMAL | Status: AC
  Administered 2018-10-27: 1.5 mg
  Filled 2018-10-27: qty 1

## 2018-10-27 MED ORDER — FENTANYL CITRATE (PF) 100 MCG/2ML IJ SOLN: 25.0000 ug | INTRAMUSCULAR | Status: DC | PRN

## 2018-10-27 SURGICAL SUPPLY — 52 items
ADH SKN CLS APL DERMABOND .7 (GAUZE/BANDAGES/DRESSINGS) ×1
APL PRP STRL LF DISP 70% ISPRP (MISCELLANEOUS) ×1
CANISTER SUCT 1200ML W/VALVE (MISCELLANEOUS) ×2 IMPLANT
CHLORAPREP W/TINT 26 (MISCELLANEOUS) ×2 IMPLANT
COVER WAND RF STERILE (DRAPES) ×2 IMPLANT
DECANTER SPIKE VIAL GLASS SM (MISCELLANEOUS) ×2 IMPLANT
DEFOGGER SCOPE WARMER CLEARIFY (MISCELLANEOUS) ×2 IMPLANT
DERMABOND ADVANCED (GAUZE/BANDAGES/DRESSINGS) ×1
DERMABOND ADVANCED .7 DNX12 (GAUZE/BANDAGES/DRESSINGS) ×1 IMPLANT
DRAPE 3/4 80X56 (DRAPES) ×2 IMPLANT
DRAPE ARM DVNC X/XI (DISPOSABLE) ×4 IMPLANT
DRAPE COLUMN DVNC XI (DISPOSABLE) ×1 IMPLANT
DRAPE DA VINCI XI ARM (DISPOSABLE) ×4
DRAPE DA VINCI XI COLUMN (DISPOSABLE) ×1
ELECT CAUTERY BLADE 6.4 (BLADE) ×2 IMPLANT
ELECT REM PT RETURN 9FT ADLT (ELECTROSURGICAL) ×2
ELECTRODE REM PT RTRN 9FT ADLT (ELECTROSURGICAL) ×1 IMPLANT
GLOVE BIO SURGEON STRL SZ7 (GLOVE) ×6 IMPLANT
GOWN STRL REUS W/ TWL LRG LVL3 (GOWN DISPOSABLE) ×4 IMPLANT
GOWN STRL REUS W/TWL LRG LVL3 (GOWN DISPOSABLE) ×8
IRRIGATION STRYKERFLOW (MISCELLANEOUS) IMPLANT
IRRIGATOR STRYKERFLOW (MISCELLANEOUS)
IRRIGATOR SUCT 8 DISP DVNC XI (IRRIGATION / IRRIGATOR) IMPLANT
IRRIGATOR SUCTION 8MM XI DISP (IRRIGATION / IRRIGATOR) ×1
IV NS 1000ML (IV SOLUTION) ×2
IV NS 1000ML BAXH (IV SOLUTION) IMPLANT
KIT PINK PAD W/HEAD ARE REST (MISCELLANEOUS) ×2
KIT PINK PAD W/HEAD ARM REST (MISCELLANEOUS) ×1 IMPLANT
LABEL OR SOLS (LABEL) ×2 IMPLANT
NEEDLE HYPO 22GX1.5 SAFETY (NEEDLE) ×2 IMPLANT
OBTURATOR OPTICAL STANDARD 8MM (TROCAR) ×1
OBTURATOR OPTICAL STND 8 DVNC (TROCAR) ×1
OBTURATOR OPTICALSTD 8 DVNC (TROCAR) ×1 IMPLANT
PACK LAP CHOLECYSTECTOMY (MISCELLANEOUS) ×2 IMPLANT
PENCIL ELECTRO HAND CTR (MISCELLANEOUS) ×2 IMPLANT
SEAL CANN UNIV 5-8 DVNC XI (MISCELLANEOUS) ×4 IMPLANT
SEAL XI 5MM-8MM UNIVERSAL (MISCELLANEOUS) ×4
SEALER VESSEL DA VINCI XI (MISCELLANEOUS) ×1
SEALER VESSEL EXT DVNC XI (MISCELLANEOUS) ×1 IMPLANT
SOLUTION ELECTROLUBE (MISCELLANEOUS) ×2 IMPLANT
SPONGE LAP 18X18 RF (DISPOSABLE) ×2 IMPLANT
SPONGE LAP 4X18 RFD (DISPOSABLE) ×1 IMPLANT
SUT MNCRL 4-0 (SUTURE) ×2
SUT MNCRL 4-0 27XMFL (SUTURE) ×1
SUT SILK 2 0 SH (SUTURE) ×4 IMPLANT
SUT VICRYL 0 AB UR-6 (SUTURE) ×4 IMPLANT
SUT VLOC 90 S/L VL9 GS22 (SUTURE) ×2 IMPLANT
SUTURE MNCRL 4-0 27XMF (SUTURE) ×1 IMPLANT
TRAY FOLEY SLVR 16FR LF STAT (SET/KITS/TRAYS/PACK) ×2 IMPLANT
TROCAR 130MM GELPORT  DAV (MISCELLANEOUS) ×2 IMPLANT
TROCAR XCEL NON-BLD 5MMX100MML (ENDOMECHANICALS) ×2 IMPLANT
TUBING EVAC SMOKE HEATED PNEUM (TUBING) ×2 IMPLANT

## 2018-10-27 NOTE — Anesthesia Preprocedure Evaluation (Signed)
Anesthesia Evaluation  Patient identified by MRN, date of birth, ID band Patient awake    Reviewed: Allergy & Precautions, NPO status , Patient's Chart, lab work & pertinent test results, reviewed documented beta blocker date and time   History of Anesthesia Complications (+) PONV and history of anesthetic complications  Airway Mallampati: III  TM Distance: >3 FB     Dental  (+) Chipped   Pulmonary former smoker,           Cardiovascular + DVT       Neuro/Psych    GI/Hepatic GERD  Controlled,  Endo/Other    Renal/GU      Musculoskeletal  (+) Arthritis ,   Abdominal   Peds  Hematology   Anesthesia Other Findings Hx of PE. Has IVC filter. EKG ok.  Reproductive/Obstetrics                             Anesthesia Physical Anesthesia Plan  ASA: III  Anesthesia Plan: General   Post-op Pain Management:    Induction: Intravenous  PONV Risk Score and Plan:   Airway Management Planned: Oral ETT  Additional Equipment:   Intra-op Plan:   Post-operative Plan:   Informed Consent: I have reviewed the patients History and Physical, chart, labs and discussed the procedure including the risks, benefits and alternatives for the proposed anesthesia with the patient or authorized representative who has indicated his/her understanding and acceptance.       Plan Discussed with: CRNA  Anesthesia Plan Comments:         Anesthesia Quick Evaluation

## 2018-10-27 NOTE — Interval H&P Note (Signed)
History and Physical Interval Note:  10/27/2018 7:24 AM  Elizabeth Crawford  has presented today for surgery, with the diagnosis of PARAESOPHAGEAL HERNIA.  The various methods of treatment have been discussed with the patient and family. After consideration of risks, benefits and other options for treatment, the patient has consented to  Procedure(s): XI ROBOTIC PARAESOPHAGEAL HERNIA REPAIR (N/A) as a surgical intervention.  The patient's history has been reviewed, patient examined, no change in status, stable for surgery.  I have reviewed the patient's chart and labs.  Questions were answered to the patient's satisfaction.     [place]

## 2018-10-27 NOTE — Anesthesia Postprocedure Evaluation (Signed)
Anesthesia Post Note  Patient: Elizabeth Crawford  Procedure(s) Performed: XI ROBOTIC PARAESOPHAGEAL HERNIA REPAIR (N/A )  Patient location during evaluation: PACU Anesthesia Type: General Level of consciousness: awake and alert Pain management: pain level controlled Vital Signs Assessment: post-procedure vital signs reviewed and stable Respiratory status: spontaneous breathing, nonlabored ventilation, respiratory function stable and patient connected to nasal cannula oxygen Cardiovascular status: blood pressure returned to baseline and stable Postop Assessment: no apparent nausea or vomiting Anesthetic complications: no     Last Vitals:  Vitals:   10/27/18 1414 10/27/18 1532  BP:  139/89  Pulse: 90 89  Resp: 16 16  Temp:  36.6 C  SpO2: 97% 94%    Last Pain:  Vitals:   10/27/18 1532  TempSrc: Oral  PainSc:                  Callan Norden S

## 2018-10-27 NOTE — Anesthesia Post-op Follow-up Note (Signed)
Anesthesia QCDR form completed.        

## 2018-10-27 NOTE — Transfer of Care (Signed)
Immediate Anesthesia Transfer of Care Note  Patient: Elizabeth Crawford  Procedure(s) Performed: XI ROBOTIC PARAESOPHAGEAL HERNIA REPAIR (N/A )  Patient Location: PACU  Anesthesia Type:General  Level of Consciousness: drowsy and patient cooperative  Airway & Oxygen Therapy: Patient Spontanous Breathing and Patient connected to face mask oxygen  Post-op Assessment: Report given to RN and Post -op Vital signs reviewed and stable  Post vital signs: Reviewed and stable  Last Vitals:  Vitals Value Taken Time  BP 125/75 10/27/18 1128  Temp 36.2 C 10/27/18 1128  Pulse 85 10/27/18 1133  Resp 35 10/27/18 1133  SpO2 100 % 10/27/18 1133  Vitals shown include unvalidated device data.  Last Pain:  Vitals:   10/27/18 0633  TempSrc: Tympanic  PainSc: 0-No pain         Complications: No apparent anesthesia complications

## 2018-10-27 NOTE — Op Note (Signed)
Robotic assisted laparoscopic repair of  paraesophageal hernia and  Nissen fundoplication   Pre-operative Diagnosis:Paraesophageal  hernia  Post-operative Diagnosis: same  Procedure:  Robotic assisted laparoscopic  repair of  paraesophageal hernia with Nissen fundoplication   Surgeon: Caroleen Hamman, MD FACS  Assistant: Dr. Genevive Bi. Required due to the complexity of the case the need for exposure and lack of first assist.  Anesthesia: Gen. with endotracheal tube  Findings: Type III paraesophageal hernia Loose wrap 360 degree over 48 FR Bougie   Estimated Blood Loss: 100cc                Complications: none   Procedure Details  The patient was seen again in the Holding Room. The benefits, complications, treatment options, and expected outcomes were discussed with the patient. The risks of bleeding, infection, recurrence of symptoms, failure to resolve symptoms,  esophageal damage, Dysphagia, bowel injury, any of which could require further surgery were reviewed with the patient. The likelihood of improving the patient's symptoms with return to their baseline status is good.  The patient and/or family concurred with the proposed plan, giving informed consent.  The patient was taken to Operating Room, identified  and the procedure verified.  A Time Out was held and the above information confirmed.  Prior to the induction of general anesthesia, antibiotic prophylaxis was administered. VTE prophylaxis was in place. General endotracheal anesthesia was then administered and tolerated well. After the induction, the abdomen was prepped with Chloraprep and draped in the sterile fashion. The patient was positioned in the supine position.  Cut down technique was used to enter the abdominal cavity and a Hasson trochar was placed after two vicryl stitches were anchored to the fascia. Pneumoperitoneum was then created with CO2 and tolerated well without any adverse changes in the patient's vital signs.   Three 8-mm ports were placed under direct vision. All skin incisions  were infiltrated with a local anesthetic agent before making the incision and placing the trocars. An additional 5 mm regular laparoscopic port was placed to assist with retraction and exposure.   The patient was positioned  in reverse Trendelenburg, robot was brought to the surgical field and docked in the standard fashion.  We made sure all the instrumentation was kept indirect view at all times and that there were no collision between the arms. I scrubbed out and went to the console.  I used a attic arm to retract the liver, the vessel sealer on my right hand and a forced bipolar grasper on my left hand.  There is along the extra 5 mm port allow me ample exposure and the ability to perform meticulous dissection  We Started dividing the lesser omentum via the pars flaccida.  We Were able to dissect the lesser curvature of the stomach and  dissected the fundus free from the right and left crus.  We circumferentially dissected the GE junction.  This was a difficult case due to the chronicity of the sac and how more than 50% of the stomach was within the mediastinum. It required patience and meticulous dissection.  The hernia sac was also completely reduced and we were able to bring the stomach into the intra-abdominal position.  Attention then was turned to the greater curvature where the short gastrics were divided with sealer device.  We were able to identify the left crus and again were able to make sure there was a good circumferential dissection and that the hernia sac was completely excised.  We did perform  dissection within the mediastinum to allow a complete reduction of the sac and a to completely allow an intra-abdominal Nissen fundoplication.  2-0V lock suture was inserted and the crus as well as the hernia was closed with a running suture  We Asked anesthesia to place a 48 French bougie and this went easily.  We also  observe trajectory of the bougie. 360 degree Nissen fundoplication was created with multiple 2-0 silk sutures and we placed 3 stitches taking some of the esophagus within that bite.  The fundoplication measured approximately 3-1/2 cm and he was floppy. I was very happy with the way the fundoplication laid and the repair of the hernia.  Is a small traction laceration within the liver that measure about 2 mm and we were able to cauterize it appropriately.  Inspection of the  upper quadrant was performed. No bleeding, bile  Or esophageal injuries leaks, or bowel injuries were noted. Robotic instruments and robotic arms were undocked in the standard fashion. All the needles were removed under direct visualization.   I scrubbed back in.  Pneumoperitoneum was released.  The periumbilical port site was closed with interrumpted 0 Vicryl sutures. 4-0 subcuticular Monocryl was used to close the skin. Liposomal marcaine was injected to all the incisions sites.  Dermabond was  applied.  The patient was then extubated and brought to the recovery room in stable condition. Sponge, lap, and needle counts were correct at closure and at the conclusion of the case.               Caroleen Hamman, MD, FACS

## 2018-10-28 DIAGNOSIS — K449 Diaphragmatic hernia without obstruction or gangrene: Secondary | ICD-10-CM | POA: Diagnosis not present

## 2018-10-28 LAB — BASIC METABOLIC PANEL
Anion gap: 7 (ref 5–15)
BUN: 12 mg/dL (ref 8–23)
CO2: 24 mmol/L (ref 22–32)
Calcium: 8.9 mg/dL (ref 8.9–10.3)
Chloride: 111 mmol/L (ref 98–111)
Creatinine, Ser: 0.9 mg/dL (ref 0.44–1.00)
GFR calc Af Amer: >60 mL/min (ref >60)
GFR calc non Af Amer: >60 mL/min (ref >60)
Glucose, Bld: 102 mg/dL — ABNORMAL HIGH (ref 70–99)
Potassium: 4.2 mmol/L (ref 3.5–5.1)
Sodium: 142 mmol/L (ref 135–145)

## 2018-10-28 LAB — CBC
HCT: 34.7 % — ABNORMAL LOW (ref 36.0–46.0)
Hemoglobin: 11.3 g/dL — ABNORMAL LOW (ref 12.0–15.0)
MCH: 31 pg (ref 26.0–34.0)
MCHC: 32.6 g/dL (ref 30.0–36.0)
MCV: 95.1 fL (ref 80.0–100.0)
Platelets: 134 10*3/uL — ABNORMAL LOW (ref 150–400)
RBC: 3.65 MIL/uL — ABNORMAL LOW (ref 3.87–5.11)
RDW: 12.8 % (ref 11.5–15.5)
WBC: 10.3 10*3/uL (ref 4.0–10.5)
nRBC: 0 % (ref 0.0–0.2)

## 2018-10-28 NOTE — Progress Notes (Signed)
Pt given discharge instructions and verbalized understanding.

## 2018-10-28 NOTE — Care Management CC44 (Signed)
Condition Code 44 Documentation Completed  Patient Details  Name: Elizabeth Crawford MRN: [id number] Date of Birth: [birth year]   Condition Code 44 given:  Yes Patient signature on Condition Code 44 notice:  Yes Documentation of 2 MD's agreement:  Yes Code 44 added to claim:  Yes    Candie Chroman, LCSW 10/28/2018, 10:54 AM

## 2018-10-28 NOTE — Discharge Summary (Signed)
[facility] SURGICAL DISCHARGE SUMMARY   Patient ID: Elizabeth Crawford MRN: [id number] DOB/AGE: May 02, 1945 73 y.o.  Admit date: 10/27/2018 Discharge date: 10/28/2018  Discharge Diagnoses Patient Active Problem List   Diagnosis Date Noted  . S/P repair of paraesophageal hernia 10/27/2018  . GERD (gastroesophageal reflux disease) 08/24/2018    Consultants None  Procedures 10/27/2018:  Robotic assisted laparoscopic repair of hiatal hernia and Nissen fundoplication  HPI: Elizabeth Crawford is a 73 y.o. female with a history of large hiatal hernia and daily GERD symptoms who presents to [facility] on 08/04 for scheduled robotic assisted laparoscopic repair of hiatal hernia and Nissen fundoplication with Dr Dahlia Byes.   Hospital Course: Post-operatively, patient's pain/symptoms improved/resolved and advancement of patient's diet (to full diet - Nissen diet guidelines) and ambulation were well-tolerated. The remainder of patient's hospital course was essentially unremarkable, and discharge planning was initiated accordingly with patient safely able to be discharged home with appropriate discharge instructions, pain control (patient offered narcotic pain medications however she refused given her history of nausea with these, instead she will try tylenol.motrin/ice for pain management), and outpatient follow-up after all of her and family's questions were answered to their expressed satisfaction.   Discharge Condition: Good    Physical Examination:  Constitutional: Well appearing female, NAD Pulmonary: Normal effort, no respiratory distress Gastrointestinal: Soft, incisional soreness, non-distended, no rebound/guarding Skin: Laparoscopic incisions are CDI with dermabond, no erythema or drainage   Allergies as of 10/28/2018      Reactions   Lidocaine Other (See Comments)   UTI (polyuria, dysuria) and facial flushing   Tape    Paper and adhesive tape make blisters and redness and  difficult to remove even on the first day   Methylprednisolone Palpitations   Sulfasalazine Nausea Only   Sulfur Nausea And Vomiting      Medication List    TAKE these medications   aspirin 81 MG EC tablet Take 81 mg by mouth daily.   budesonide 0.5 MG/2ML nebulizer solution Commonly known as: PULMICORT Take 0.5 mg by nebulization daily as needed.   loratadine 10 MG tablet Commonly known as: CLARITIN Take 10 mg by mouth daily.   naproxen sodium 220 MG tablet Commonly known as: ALEVE Take 220 mg by mouth daily as needed (pain).   omeprazole 40 MG capsule Commonly known as: PRILOSEC Take 40 mg by mouth daily.   rosuvastatin 5 MG tablet Commonly known as: CRESTOR Take 5 mg by mouth at bedtime.        Follow-up Information    Pabon, [place] F, MD. Schedule an appointment as soon as possible for a visit in 2 week(s).   Specialty: General Surgery Why: s/p robot nissen Contact information: [address] [place] [address] [phone]            Time spent on discharge management including discussion of hospital course, clinical condition, outpatient instructions, prescriptions, and follow up with the patient and members of the medical team: >30 minutes  -- Edison Simon , PA-C [facility]  10/28/2018, 9:45 AM [phone] M-F: 7am - 4pm

## 2018-11-04 ENCOUNTER — Telehealth: Payer: Self-pay | Admitting: *Deleted

## 2018-11-04 NOTE — Telephone Encounter (Signed)
Patient called and stated that she had surgery on 10/27/18 with Dr.Pabon and she said everything is fine except one stitch is red. She stated that its not draining and its not swollen, does she need to be worried or is she okay until she comes in for her appointment on 11/11/18

## 2018-11-04 NOTE — Telephone Encounter (Signed)
Spoke with patient-she has an area around one of the sutures that is bright red, however no drainage, no fever, chills, and it does not feel warm or hot to touch. Patient will take a picture and send for [place] to look at.   Per Dr.Pabon looks ok, however if she patient has pus drainage, fever, to call and we can see her.

## 2018-11-11 ENCOUNTER — Encounter: Payer: Self-pay | Admitting: Surgery

## 2018-11-11 ENCOUNTER — Ambulatory Visit (INDEPENDENT_AMBULATORY_CARE_PROVIDER_SITE_OTHER): Payer: Medicare Other | Admitting: Surgery

## 2018-11-11 ENCOUNTER — Other Ambulatory Visit: Payer: Self-pay

## 2018-11-11 VITALS — BP 141/83 | HR 77 | Temp 97.5°F | Ht 64.0 in | Wt 193.0 lb

## 2018-11-11 DIAGNOSIS — Z09 Encounter for follow-up examination after completed treatment for conditions other than malignant neoplasm: Secondary | ICD-10-CM

## 2018-11-11 NOTE — Patient Instructions (Signed)
Return as needed.The patient is aware to call back for any questions or concerns.  

## 2018-11-12 NOTE — Progress Notes (Signed)
S/P ROBOTIC paraesophageal repair Doing very well Wheezing is gone , no reflux Eating well  PE NAD Abd: soft, nt, incisions c/d/i, no peritonitis or infection  A/ p Doing very well Follow post Nissen diet F/U prn

## 2018-12-27 DIAGNOSIS — G4733 Obstructive sleep apnea (adult) (pediatric): Secondary | ICD-10-CM | POA: Insufficient documentation

## 2018-12-27 DIAGNOSIS — E7849 Other hyperlipidemia: Secondary | ICD-10-CM | POA: Insufficient documentation

## 2019-01-08 ENCOUNTER — Telehealth: Payer: Self-pay | Admitting: Surgery

## 2019-01-08 NOTE — Telephone Encounter (Signed)
Surgery paraesophageal hernia repair 10/27/2018 DR.Pabon.   Patient states she has episodes of vomiting after her surgery. She has spoken with Dr.Pabon about this.  Patient states she has episodes of diarrhea with these episodes.  She states when she burps it is so loud and very embarrassing.   Patient scheduled an appointment. Advised to eat smaller portions to see if this will help and keep a diary until appointment.

## 2019-01-08 NOTE — Telephone Encounter (Signed)
Telephone Triage Questions    Type of surgery? XI Robotic Paraesophageal hernia repair         Date?      10/27/2018                        Physician?  Dr. Dahlia Byes      Pain ? No   Nausea, vomiting, Fever, chills? Vomiting, happening after she eats everytime.    Constipation / Diarrhea? Diarrhea   Last Bowel movement? Last night 01/07/2019    Please call patient and advise.

## 2019-01-13 ENCOUNTER — Other Ambulatory Visit: Payer: Self-pay

## 2019-01-13 ENCOUNTER — Ambulatory Visit (INDEPENDENT_AMBULATORY_CARE_PROVIDER_SITE_OTHER): Payer: Self-pay | Admitting: Surgery

## 2019-01-13 ENCOUNTER — Encounter: Payer: Self-pay | Admitting: Surgery

## 2019-01-13 VITALS — BP 133/82 | HR 73 | Temp 97.9°F | Ht 64.0 in | Wt 185.0 lb

## 2019-01-13 DIAGNOSIS — Z09 Encounter for follow-up examination after completed treatment for conditions other than malignant neoplasm: Secondary | ICD-10-CM

## 2019-01-13 NOTE — Patient Instructions (Signed)
Please call with any questions you may have.

## 2019-01-13 NOTE — Progress Notes (Signed)
S/p robotic nissen paraesophageal Doing well Doing much better from lung perspective Had issues last week because tried pepsi and burgers D/W her about avoiding carbonation His weeks doing much better No fevers or chills  PE NAD Abd: soft, nt, incisions healed  A/p Doing well Educated about diet modifications F/U prn

## 2019-01-25 ENCOUNTER — Telehealth: Payer: Self-pay | Admitting: Surgery

## 2019-01-25 DIAGNOSIS — R1084 Generalized abdominal pain: Secondary | ICD-10-CM

## 2019-01-25 DIAGNOSIS — Z09 Encounter for follow-up examination after completed treatment for conditions other than malignant neoplasm: Secondary | ICD-10-CM

## 2019-01-25 NOTE — Telephone Encounter (Signed)
Spoke with Dr.Pabon-Ct abdomen and pelvis CT with Po and IV contrast.   Ct scan 01/28/2019 @ 10:15 [facility]. Nothing to eat or drink 4 hours prior to test. She wishes to try gas x and if this does not help then she will call and let us know. She declined the reglan medication  at this time but will let us know if she changes her mind. Follow up appointment 01/28/2019 with dr.pabon.

## 2019-01-25 NOTE — Telephone Encounter (Signed)
Patient states she is having constant belching and burping. She is having pain below her sternum since surgery. She currently has UTI and is being treated. She denies eating gassy food.  She is eating applesauce and crackers and the gas just will not go away no matter what she eats. She is wondering if she has damaged her esophagus  due to her vomiting. She takes 2 sips of pepsi a day. She had diarrhea  Yesterday.

## 2019-01-25 NOTE — Telephone Encounter (Signed)
Patient is calling said she is belching a lot, at least once a week she is having diarrhea and throwing up, but now she's only been having diarrhea, patient is still having some chest pain, but did see someone for the chest pain. Patient is a little concern and is asking if someone would give her a call. Please call patient and advise.

## 2019-01-28 ENCOUNTER — Ambulatory Visit
Admission: RE | Admit: 2019-01-28 | Discharge: 2019-01-28 | Disposition: A | Discharge status: Home / Self Care | Payer: Medicare Other | Source: Ambulatory Visit | Attending: Surgery | Admitting: Surgery

## 2019-01-28 ENCOUNTER — Other Ambulatory Visit
Admission: RE | Admit: 2019-01-28 | Discharge: 2019-01-28 | Disposition: A | Discharge status: Home / Self Care | Payer: Medicare Other | Source: Home / Self Care | Attending: Surgery | Admitting: Surgery

## 2019-01-28 ENCOUNTER — Telehealth: Payer: Self-pay

## 2019-01-28 ENCOUNTER — Other Ambulatory Visit: Payer: Self-pay

## 2019-01-28 DIAGNOSIS — R1084 Generalized abdominal pain: Secondary | ICD-10-CM | POA: Diagnosis present

## 2019-01-28 DIAGNOSIS — Z09 Encounter for follow-up examination after completed treatment for conditions other than malignant neoplasm: Secondary | ICD-10-CM

## 2019-01-28 LAB — CREATININE, SERUM
Creatinine, Ser: 0.81 mg/dL (ref 0.44–1.00)
GFR calc Af Amer: >60 mL/min (ref >60)
GFR calc non Af Amer: >60 mL/min (ref >60)

## 2019-01-28 LAB — BUN: BUN: 11 mg/dL (ref 8–23)

## 2019-01-28 MED ORDER — ONDANSETRON HCL 4 MG PO TABS: 4.0000 mg | ORAL_TABLET | Freq: Three times a day (TID) | ORAL | 0 refills | Status: DC | PRN

## 2019-01-28 MED ORDER — IOHEXOL 300 MG/ML  SOLN
100.0000 mL | Freq: Once | INTRAMUSCULAR | Status: AC | PRN
  Administered 2019-01-28: 100 mL via INTRAVENOUS

## 2019-01-28 NOTE — Telephone Encounter (Signed)
Notified patient that her CT showed some narrowing and Dr Dahlia Byes suggested that she go on a full liquid diet. Different nutritious liquids discussed with patient. She would like something for her nausea and we will send in a prescription for Zofran for her. She will follow up on Monday as scheduled.

## 2019-01-28 NOTE — Telephone Encounter (Signed)
-----   Message from Jules Husbands, MD sent at 01/28/2019 12:07 PM EST ----- Please let her know her CT show a bit of narrowing. I would suggest going on a full liquid diet, we can call her in zofran 4 mg po q 6 hrs prn. I will see her Monday. No other worrisome findings. ----- Message ----- From: Buel Ream, [facility] [place] Sent: 01/28/2019  10:48 AM EST To: Jules Husbands, MD

## 2019-02-01 ENCOUNTER — Other Ambulatory Visit: Payer: Self-pay

## 2019-02-01 ENCOUNTER — Ambulatory Visit (INDEPENDENT_AMBULATORY_CARE_PROVIDER_SITE_OTHER): Payer: Medicare Other | Admitting: Surgery

## 2019-02-01 ENCOUNTER — Encounter: Payer: Self-pay | Admitting: Surgery

## 2019-02-01 VITALS — BP 113/75 | HR 76 | Temp 96.8°F | Resp 14 | Ht 64.0 in | Wt 180.8 lb

## 2019-02-01 DIAGNOSIS — R110 Nausea: Secondary | ICD-10-CM | POA: Diagnosis not present

## 2019-02-01 NOTE — Patient Instructions (Addendum)
You may resume soft food diet. Eat small amounts frequently throughout the day. Please call our office if you have questions or concerns. Please see your follow up appointment listed below.

## 2019-02-03 ENCOUNTER — Encounter: Payer: Self-pay | Admitting: Surgery

## 2019-02-03 NOTE — Progress Notes (Signed)
Outpatient Surgical Follow Up  02/03/2019  Elizabeth Crawford is an 73 y.o. female.   Chief Complaint  Patient presents with  . Routine Post Op    Paraesophageal hernia reapir 10/27/2018    HPI: Elizabeth Crawford is a 73 year old female well-known to me with a history of robotic paraesophageal hernia repair with Nissen fundoplication on August [id number] now comes in with nausea and some mild dysphagia. We have ordered a CT scan that I have personally reviewed showing evidence of a small hiatal hernia but there is intact wrapped. I imagine that this things have migrated asked time has passed on. There is evidence of contrast going all the way throughout the GI tract. There is no evidence of perforation. I have prescribed for Zofran that has helped tremendously. She is tolerating diet currently. No fevers no chills. Her pulmonary symptoms continue to improve and she does not have any reflux symptoms. BUn and Creat were nml  Past Medical History:  Diagnosis Date  . Allergy   . Arthritis   . Complication of anesthesia   . DVT (deep venous thrombosis) ([place])   . GERD (gastroesophageal reflux disease)   . H/O seasonal allergies   . PONV (postoperative nausea and vomiting)    Gets sick every time  . Pulmonary embolism ([place])   . Sleep apnea   . Spondylolisthesis     Past Surgical History:  Procedure Laterality Date  . BACK SURGERY     x2  . BLADDER SUSPENSION  2009  . CARPAL TUNNEL RELEASE  2003   Left hand  . CHOLECYSTECTOMY  2000   Laparoscopic  . COLONOSCOPY W/ POLYPECTOMY  2014  . COLONOSCOPY WITH PROPOFOL N/A 05/22/2017   Procedure: COLONOSCOPY WITH PROPOFOL;  Surgeon: Lollie Sails, MD;  Location: [facility] ENDOSCOPY;  Service: Endoscopy;  Laterality: N/A;  . DECOMPRESSIVE LUMBAR LAMINECTOMY LEVEL 2 N/A 11/09/2015   Procedure: DECOMPRESSION L2-5 REMOVAL PEDICULE SCREWS L4-5;  Surgeon: Melina Schools, MD;  Location: [place];  Service: Orthopedics;  Laterality: N/A;  . ESOPHAGOGASTRODUODENOSCOPY  (EGD) WITH PROPOFOL N/A 10/09/2018   Procedure: ESOPHAGOGASTRODUODENOSCOPY (EGD) WITH PROPOFOL;  Surgeon: Lollie Sails, MD;  Location: [facility] ENDOSCOPY;  Service: Endoscopy;  Laterality: N/A;  . EYE SURGERY Bilateral 2005   cataract surgery   . HARDWARE REMOVAL N/A 11/09/2015   Procedure: HARDWARE REMOVAL;  Surgeon: Melina Schools, MD;  Location: [place];  Service: Orthopedics;  Laterality: N/A;  . [place]  . IVC filter placement  02-01-10  . SPINE SURGERY  5 23-12   anterolateral fusion/ rodding  . TONSILLECTOMY    . TUBAL LIGATION  1977    Family History  Problem Relation Age of Onset  . COPD Mother   . Heart disease Mother   . Aortic aneurysm Mother   . Heart disease Father     Social History:  reports that she quit smoking about 42 years ago. Her smoking use included cigarettes. She quit after 15.00 years of use. She has never used smokeless tobacco. She reports current alcohol use. She reports that she does not use drugs.  Allergies:  Allergies  Allergen Reactions  . Lidocaine Other (See Comments)    UTI (polyuria, dysuria) and facial flushing  . Tape     Paper and adhesive tape make blisters and redness and difficult to remove even on the first day  . Methylprednisolone Palpitations  . Sulfasalazine Nausea Only  . Sulfur Nausea And Vomiting    Medications reviewed.  ROS Full ROS performed and is otherwise negative other than what is stated in HPI   BP 113/75   Pulse 76   Temp (!) 96.8 F (36 C) (Temporal)   Resp 14   Ht 5\' 4"  (1.626 m)   Wt 180 lb 12.8 oz (82 kg)   SpO2 98%   BMI 31.03 kg/m   Physical Exam Vitals signs and nursing note reviewed. Exam conducted with a chaperone present.  Constitutional:      General: She is not in acute distress.    Appearance: Normal appearance. She is normal weight.  Neck:     Musculoskeletal: Normal range of motion and neck supple. No neck rigidity.  Cardiovascular:     Rate and Rhythm: Normal  rate and regular rhythm.     Heart sounds: No murmur.  Pulmonary:     Effort: Pulmonary effort is normal. No respiratory distress.     Breath sounds: No stridor. No wheezing or rhonchi.  Abdominal:     General: Abdomen is flat. There is no distension.     Palpations: There is no mass.     Tenderness: There is no abdominal tenderness. There is no guarding.     Hernia: No hernia is present.  Musculoskeletal: Normal range of motion.        General: No swelling or tenderness.  Lymphadenopathy:     Cervical: No cervical adenopathy.  Skin:    General: Skin is warm and dry.     Capillary Refill: Capillary refill takes less than 2 seconds.  Neurological:     General: No focal deficit present.     Mental Status: She is alert and oriented to person, place, and time.  Psychiatric:        Mood and Affect: Mood normal.        Behavior: Behavior normal.        Thought Content: Thought content normal.        Judgment: Judgment normal.         Assessment/Plan: 73 year old female with nausea after hiatal hernia repair and Nissen fundoplication. Currently there is evidence of a small hiatal hernia but the wrap is intact. She has responded very well to Zofran. I have discussed the situation in detail with the patient. I did offer endoscopic evaluation. Currently she wishes to continue with the Zofran as needed and perform some diet modifications. She is also very happy with the results from the wrap from a pulmonary perspective given that her respiratory status has improved dramatically  Greater than 50% of the 25 minutes  visit was spent in counseling/coordination of care   Caroleen Hamman, MD [place] Surgeon

## 2019-02-15 ENCOUNTER — Telehealth: Payer: Self-pay

## 2019-02-15 NOTE — Telephone Encounter (Signed)

## 2019-02-16 ENCOUNTER — Other Ambulatory Visit: Payer: Self-pay

## 2019-02-16 ENCOUNTER — Ambulatory Visit (INDEPENDENT_AMBULATORY_CARE_PROVIDER_SITE_OTHER): Payer: Medicare Other | Admitting: Plastic Surgery

## 2019-02-16 ENCOUNTER — Encounter: Payer: Self-pay | Admitting: Plastic Surgery

## 2019-02-16 VITALS — BP 122/76 | HR 65 | Temp 97.3°F | Ht 64.0 in | Wt 173.0 lb

## 2019-02-16 DIAGNOSIS — N62 Hypertrophy of breast: Secondary | ICD-10-CM

## 2019-02-16 MED ORDER — CEPHALEXIN 500 MG PO CAPS: 500.0000 mg | ORAL_CAPSULE | Freq: Four times a day (QID) | ORAL | 0 refills | Status: DC

## 2019-02-16 MED ORDER — HYDROCODONE-ACETAMINOPHEN 5-325 MG PO TABS: 1.0000 | ORAL_TABLET | Freq: Two times a day (BID) | ORAL | 0 refills | Status: AC | PRN

## 2019-02-16 MED ORDER — HYDROCODONE-ACETAMINOPHEN 5-325 MG PO TABS: 1.0000 | ORAL_TABLET | Freq: Two times a day (BID) | ORAL | 0 refills | Status: DC | PRN

## 2019-02-16 MED ORDER — ONDANSETRON HCL 4 MG PO TABS: 4.0000 mg | ORAL_TABLET | Freq: Three times a day (TID) | ORAL | 0 refills | Status: DC | PRN

## 2019-02-16 NOTE — Progress Notes (Signed)
Patient ID: Elizabeth Crawford, female    DOB: December 17, 1945, 73 y.o.   MRN: [id number]   Chief Complaint  Patient presents with  . Breast Problem    The patient is a 73 year old female here for her history and physical for breast reduction.  She is 5 feet 4 inches tall and weighs 200 pounds.  Her to her preoperative bra size is a 42DD-DDD.  She would like to be as small as possible.  We have estimated 650 g to be removed from each breast.  Her last mammogram was October 2019 and it was negative she had another mammogram the last few weeks and had the results sent to [place].  She has not had any recent illnesses.    Review of Systems  Constitutional: Positive for activity change. Negative for appetite change.  HENT: Negative.   Eyes: Negative.   Respiratory: Negative.  Negative for chest tightness.   Gastrointestinal: Negative.   Endocrine: Negative.   Genitourinary: Negative.   Musculoskeletal: Positive for back pain and neck pain.  Hematological: Negative.   Psychiatric/Behavioral: Negative.     Past Medical History:  Diagnosis Date  . Allergy   . Arthritis   . Complication of anesthesia   . DVT (deep venous thrombosis) ([place])   . GERD (gastroesophageal reflux disease)   . H/O seasonal allergies   . PONV (postoperative nausea and vomiting)    Gets sick every time  . Pulmonary embolism ([place])   . Sleep apnea   . Spondylolisthesis     Past Surgical History:  Procedure Laterality Date  . BACK SURGERY     x2  . BLADDER SUSPENSION  2009  . CARPAL TUNNEL RELEASE  2003   Left hand  . CHOLECYSTECTOMY  2000   Laparoscopic  . COLONOSCOPY W/ POLYPECTOMY  2014  . COLONOSCOPY WITH PROPOFOL N/A 05/22/2017   Procedure: COLONOSCOPY WITH PROPOFOL;  Surgeon: Lollie Sails, MD;  Location: [facility] ENDOSCOPY;  Service: Endoscopy;  Laterality: N/A;  . DECOMPRESSIVE LUMBAR LAMINECTOMY LEVEL 2 N/A 11/09/2015   Procedure: DECOMPRESSION L2-5 REMOVAL PEDICULE SCREWS L4-5;  Surgeon: Melina Schools,  MD;  Location: [place];  Service: Orthopedics;  Laterality: N/A;  . ESOPHAGOGASTRODUODENOSCOPY (EGD) WITH PROPOFOL N/A 10/09/2018   Procedure: ESOPHAGOGASTRODUODENOSCOPY (EGD) WITH PROPOFOL;  Surgeon: Lollie Sails, MD;  Location[facility] ENDOSCOPY;  Service: Endoscopy;  Laterality: N/A;  . EYE SURGERY Bilateral 2005   cataract surgery   . HARDWARE REMOVAL N/A 11/09/2015   Procedure: HARDWARE REMOVAL;  Surgeon: Melina Schools, MD;  Location: [place];  Service: Orthopedics;  Laterality: N/A;  . [place]  . IVC filter placement  02-01-10  . SPINE SURGERY  5 23-12   anterolateral fusion/ rodding  . TONSILLECTOMY    . TUBAL LIGATION  1977      Current Outpatient Medications:  .  aspirin 81 MG EC tablet, Take 81 mg by mouth daily. , Disp: , Rfl:  .  clotrimazole-betamethasone (LOTRISONE) cream, APPLY TO AFFECTED AREA TWICE A DAY, Disp: , Rfl:  .  loratadine (CLARITIN) 10 MG tablet, Take 10 mg by mouth daily., Disp: , Rfl:  .  naproxen sodium (ALEVE) 220 MG tablet, Take 220 mg by mouth daily as needed (pain)., Disp: , Rfl:  .  rosuvastatin (CRESTOR) 5 MG tablet, Take 5 mg by mouth at bedtime. , Disp: , Rfl:    Objective:   Vitals:   02/16/19 1102  BP: 122/76  Pulse: 65  Temp: Mar[place] Kitchen)  97.3 F (36.3 C)  SpO2: 99%    Physical Exam Vitals signs and nursing note reviewed.  Constitutional:      Appearance: Normal appearance.  Cardiovascular:     Rate and Rhythm: Normal rate.     Pulses: Normal pulses.  Pulmonary:     Effort: Pulmonary effort is normal.  Abdominal:     General: There is no distension.     Tenderness: There is no abdominal tenderness.  Skin:    General: Skin is warm.     Capillary Refill: Capillary refill takes less than 2 seconds.  Neurological:     General: No focal deficit present.     Mental Status: She is alert and oriented to person, place, and time.  Psychiatric:        Mood and Affect: Mood normal.        Behavior: Behavior normal.         Thought Content: Thought content normal.     Assessment & Plan:  Symptomatic mammary hypertrophy  Plan for bilateral breast reduction with possible liposuction laterally. Prescriptions sent to pharmacy. The risk that can be encountered with breast reduction were discussed and include the following but not limited to these:  Breast asymmetry, fluid accumulation, firmness of the breast, inability to breast feed, loss of nipple or areola, skin loss, decrease or no nipple sensation, fat necrosis of the breast tissue, bleeding, infection, healing delay.  There are risks of anesthesia, changes to skin sensation and injury to nerves or blood vessels.  The muscle can be temporarily or permanently injured.  You may have an allergic reaction to tape, suture, glue, blood products which can result in skin discoloration, swelling, pain, skin lesions, poor healing.  Any of these can lead to the need for revisonal surgery or stage procedures.  A reduction has potential to interfere with diagnostic procedures.  Nipple or breast piercing can increase risks of infection.  This procedure is best done when the breast is fully developed.  Changes in the breast will continue to occur over time.  Pregnancy can alter the outcomes of previous breast reduction surgery, weight gain and weigh loss can also effect the long term appearance.     [place], DO

## 2019-02-17 ENCOUNTER — Other Ambulatory Visit: Payer: Self-pay | Admitting: Plastic Surgery

## 2019-02-17 MED ORDER — CEPHALEXIN 500 MG PO CAPS: 500.0000 mg | ORAL_CAPSULE | Freq: Four times a day (QID) | ORAL | 0 refills | Status: AC

## 2019-02-17 NOTE — H&P (View-Only) (Signed)
Notice received of e-prescribing transmission failure to pharmacy. New RX sent, old one discontinued.

## 2019-02-17 NOTE — Progress Notes (Signed)
Notice received of e-prescribing transmission failure to pharmacy. New RX sent, old one discontinued.

## 2019-02-24 NOTE — Progress Notes (Signed)
[id number] [place], [place] - [address] [place] [address] Phone: [phone] Fax: [phone]      Your procedure is scheduled on March 01, 2019.  Report to [facility] Main Entrance "A" at 5:30 A.M., and check in at the Admitting office.  Call this number if you have problems the morning of surgery:  [phone]  Call [phone] if you have any questions prior to your surgery date Monday-Friday 8am-4pm    Remember:  Do not eat or drink after midnight the night before your surgery    Take these medicines the morning of surgery with A SIP OF WATER: cetirizine (ZYRTEC)  ondansetron (ZOFRAN) - as needed  As of today, STOP taking any Aspirin (unless otherwise instructed by your surgeon), Aleve, Naproxen, Ibuprofen, Motrin, Advil, Goody's, BC's, all herbal medications, fish oil, and all vitamins.    The Morning of Surgery  Do not wear jewelry, make-up or nail polish.  Do not wear lotions, powders, or perfumes or deodorant  Do not shave 48 hours prior to surgery.   Do not bring valuables to the hospital.  [facility] is not responsible for any belongings or valuables.  If you are a smoker, DO NOT Smoke 24 hours prior to surgery  If you wear a CPAP at night please bring your mask, tubing, and machine the morning of surgery   Remember that you must have someone to transport you home after your surgery, and remain with you for 24 hours if you are discharged the same day.   Please bring cases for contacts, glasses, hearing aids, dentures or bridgework because it cannot be worn into surgery.    Leave your suitcase in the car.  After surgery it may be brought to your room.  For patients admitted to the hospital, discharge time will be determined by your treatment team.  Patients discharged the day of surgery will not be allowed to drive home.    Special instructions:   [place]- Preparing For Surgery  Before surgery, you can  play an important role. Because skin is not sterile, your skin needs to be as free of germs as possible. You can reduce the number of germs on your skin by washing with CHG (chlorahexidine gluconate) Soap before surgery.  CHG is an antiseptic cleaner which kills germs and bonds with the skin to continue killing germs even after washing.    Oral Hygiene is also important to reduce your risk of infection.  Remember - BRUSH YOUR TEETH THE MORNING OF SURGERY WITH YOUR REGULAR TOOTHPASTE  Please do not use if you have an allergy to CHG or antibacterial soaps. If your skin becomes reddened/irritated stop using the CHG.  Do not shave (including legs and underarms) for at least 48 hours prior to first CHG shower. It is OK to shave your face.  Please follow these instructions carefully.   1. Shower the NIGHT BEFORE SURGERY and the MORNING OF SURGERY with CHG Soap.   2. If you chose to wash your hair, wash your hair first as usual with your normal shampoo.  3. After you shampoo, rinse your hair and body thoroughly to remove the shampoo.  4. Use CHG as you would any other liquid soap. You can apply CHG directly to the skin and wash gently with a scrungie or a clean washcloth.   5. Apply the CHG Soap to your body ONLY FROM THE NECK DOWN.  Do not use on open wounds or open  sores. Avoid contact with your eyes, ears, mouth and genitals (private parts). Wash Face and genitals (private parts)  with your normal soap.   6. Wash thoroughly, paying special attention to the area where your surgery will be performed.  7. Thoroughly rinse your body with warm water from the neck down.  8. DO NOT shower/wash with your normal soap after using and rinsing off the CHG Soap.  9. Pat yourself dry with a CLEAN TOWEL.  10. Wear CLEAN PAJAMAS to bed the night before surgery, wear comfortable clothes the morning of surgery  11. Place CLEAN SHEETS on your bed the night of your first shower and DO NOT SLEEP WITH  PETS.    Day of Surgery:  Please shower the morning of surgery with the CHG soap Do not apply any deodorants/lotions. Please wear clean clothes to the hospital/surgery center.   Remember to brush your teeth WITH YOUR REGULAR TOOTHPASTE.   Please read over the following fact sheets that you were given.

## 2019-02-25 ENCOUNTER — Other Ambulatory Visit: Payer: Self-pay

## 2019-02-25 ENCOUNTER — Other Ambulatory Visit (HOSPITAL_COMMUNITY)
Admission: RE | Admit: 2019-02-25 | Discharge: 2019-02-25 | Disposition: A | Discharge status: Home / Self Care | Payer: Medicare Other | Source: Ambulatory Visit | Attending: Plastic Surgery | Admitting: Plastic Surgery

## 2019-02-25 ENCOUNTER — Encounter (HOSPITAL_COMMUNITY)
Admission: RE | Admit: 2019-02-25 | Discharge: 2019-02-25 | Disposition: A | Discharge status: Home / Self Care | Payer: Medicare Other | Source: Ambulatory Visit | Attending: Plastic Surgery | Admitting: Plastic Surgery

## 2019-02-25 DIAGNOSIS — Z01812 Encounter for preprocedural laboratory examination: Secondary | ICD-10-CM | POA: Diagnosis not present

## 2019-02-25 DIAGNOSIS — Z20828 Contact with and (suspected) exposure to other viral communicable diseases: Secondary | ICD-10-CM | POA: Diagnosis not present

## 2019-02-25 LAB — HEMOGLOBIN AND HEMATOCRIT, BLOOD
HCT: 38.7 % (ref 36.0–46.0)
Hemoglobin: 12.1 g/dL (ref 12.0–15.0)

## 2019-02-25 NOTE — Progress Notes (Signed)
PCP:  Dr. Arlean Hopping Cardiologist:  Dr. Ubaldo Glassing  EKG:  11/27/18 CXR: N/A ECHO: 12/08/18 Stress Test:  12/08/18 Cardiac Cath:  Denies  Patient had cardiac workup, but it was hiatal hernia related symptoms that she had surgical repair.  Covid testing today, 02/25/2019  Patient denies shortness of breath, fever, cough, and chest pain at PAT appointment.  Patient verbalized understanding of instructions provided today at the PAT appointment.  Patient asked to review instructions at home and day of surgery.

## 2019-02-28 LAB — NOVEL CORONAVIRUS, NAA (HOSP ORDER, SEND-OUT TO REF LAB; TAT 18-24 HRS): SARS-CoV-2, NAA: NOT DETECTED

## 2019-03-01 ENCOUNTER — Observation Stay (HOSPITAL_COMMUNITY)
Admission: RE | Admit: 2019-03-01 | Discharge: 2019-03-02 | Disposition: A | Discharge status: Home / Self Care | Payer: Medicare Other | Attending: Plastic Surgery | Admitting: Plastic Surgery

## 2019-03-01 ENCOUNTER — Encounter (HOSPITAL_COMMUNITY): Payer: Self-pay

## 2019-03-01 ENCOUNTER — Other Ambulatory Visit: Payer: Self-pay

## 2019-03-01 ENCOUNTER — Ambulatory Visit (HOSPITAL_COMMUNITY): Payer: Medicare Other | Admitting: Certified Registered"

## 2019-03-01 ENCOUNTER — Encounter (HOSPITAL_COMMUNITY)
Admission: RE | Disposition: A | Discharge status: Home / Self Care | Payer: Self-pay | Source: Home / Self Care | Attending: Plastic Surgery

## 2019-03-01 ENCOUNTER — Ambulatory Visit: Admit: 2019-03-01 | Payer: Medicare Other | Admitting: Plastic Surgery

## 2019-03-01 DIAGNOSIS — Z882 Allergy status to sulfonamides status: Secondary | ICD-10-CM | POA: Diagnosis not present

## 2019-03-01 DIAGNOSIS — G4733 Obstructive sleep apnea (adult) (pediatric): Secondary | ICD-10-CM | POA: Insufficient documentation

## 2019-03-01 DIAGNOSIS — M549 Dorsalgia, unspecified: Secondary | ICD-10-CM

## 2019-03-01 DIAGNOSIS — Z888 Allergy status to other drugs, medicaments and biological substances status: Secondary | ICD-10-CM | POA: Diagnosis not present

## 2019-03-01 DIAGNOSIS — N62 Hypertrophy of breast: Principal | ICD-10-CM | POA: Diagnosis present

## 2019-03-01 DIAGNOSIS — Z9889 Other specified postprocedural states: Secondary | ICD-10-CM

## 2019-03-01 DIAGNOSIS — Z87891 Personal history of nicotine dependence: Secondary | ICD-10-CM | POA: Insufficient documentation

## 2019-03-01 DIAGNOSIS — M1990 Unspecified osteoarthritis, unspecified site: Secondary | ICD-10-CM | POA: Diagnosis not present

## 2019-03-01 DIAGNOSIS — M542 Cervicalgia: Secondary | ICD-10-CM | POA: Diagnosis not present

## 2019-03-01 HISTORY — PX: REDUCTION MAMMAPLASTY: SUR839

## 2019-03-01 HISTORY — PX: BREAST REDUCTION SURGERY: SHX8

## 2019-03-01 SURGERY — BREAST REDUCTION WITH LIPOSUCTION
Anesthesia: General | Laterality: Bilateral

## 2019-03-01 SURGERY — BREAST REDUCTION WITH LIPOSUCTION
Anesthesia: General | Site: Breast | Laterality: Bilateral

## 2019-03-01 MED ORDER — PROPOFOL 10 MG/ML IV BOLUS
INTRAVENOUS | Status: DC | PRN
  Administered 2019-03-01: 120 mg via INTRAVENOUS

## 2019-03-01 MED ORDER — DEXAMETHASONE SODIUM PHOSPHATE 10 MG/ML IJ SOLN
INTRAMUSCULAR | Status: DC | PRN
  Administered 2019-03-01: 10 mg via INTRAVENOUS

## 2019-03-01 MED ORDER — ONDANSETRON HCL 4 MG/2ML IJ SOLN: 4.0000 mg | Freq: Four times a day (QID) | INTRAMUSCULAR | Status: DC | PRN

## 2019-03-01 MED ORDER — ONDANSETRON HCL 4 MG/2ML IJ SOLN
INTRAMUSCULAR | Status: AC
  Filled 2019-03-01: qty 2

## 2019-03-01 MED ORDER — PROPOFOL 10 MG/ML IV BOLUS
INTRAVENOUS | Status: AC
  Filled 2019-03-01: qty 20

## 2019-03-01 MED ORDER — LACTATED RINGERS IV SOLN
INTRAVENOUS | Status: DC | PRN
  Administered 2019-03-01: 08:00:00 via INTRAVENOUS

## 2019-03-01 MED ORDER — CEFAZOLIN SODIUM-DEXTROSE 2-4 GM/100ML-% IV SOLN
2.0000 g | Freq: Three times a day (TID) | INTRAVENOUS | Status: DC
  Administered 2019-03-01 – 2019-03-02 (×3): 2 g via INTRAVENOUS
  Filled 2019-03-01 (×4): qty 100

## 2019-03-01 MED ORDER — DIPHENHYDRAMINE HCL 50 MG/ML IJ SOLN
INTRAMUSCULAR | Status: AC
  Filled 2019-03-01: qty 1

## 2019-03-01 MED ORDER — FENTANYL CITRATE (PF) 100 MCG/2ML IJ SOLN
INTRAMUSCULAR | Status: DC | PRN
  Administered 2019-03-01: 150 ug via INTRAVENOUS
  Administered 2019-03-01: 50 ug via INTRAVENOUS

## 2019-03-01 MED ORDER — SUGAMMADEX SODIUM 200 MG/2ML IV SOLN
INTRAVENOUS | Status: DC | PRN
  Administered 2019-03-01: 200 mg via INTRAVENOUS

## 2019-03-01 MED ORDER — POLYETHYLENE GLYCOL 3350 17 G PO PACK: 17.0000 g | PACK | Freq: Every day | ORAL | Status: DC | PRN

## 2019-03-01 MED ORDER — 0.9 % SODIUM CHLORIDE (POUR BTL) OPTIME
TOPICAL | Status: DC | PRN
  Administered 2019-03-01: 1000 mL

## 2019-03-01 MED ORDER — SODIUM CHLORIDE 0.9 % IV SOLN
INTRAVENOUS | Status: AC
  Filled 2019-03-01 (×2): qty 500000

## 2019-03-01 MED ORDER — ONDANSETRON HCL 4 MG/2ML IJ SOLN
INTRAMUSCULAR | Status: DC | PRN
  Administered 2019-03-01: 4 mg via INTRAVENOUS

## 2019-03-01 MED ORDER — SCOPOLAMINE 1 MG/3DAYS TD PT72
MEDICATED_PATCH | TRANSDERMAL | Status: DC | PRN
  Administered 2019-03-01: 1 via TRANSDERMAL

## 2019-03-01 MED ORDER — KCL IN DEXTROSE-NACL 20-5-0.45 MEQ/L-%-% IV SOLN
INTRAVENOUS | Status: DC
  Administered 2019-03-01: 17:00:00 via INTRAVENOUS
  Filled 2019-03-01 (×2): qty 1000

## 2019-03-01 MED ORDER — BUPIVACAINE HCL (PF) 0.25 % IJ SOLN
INTRAMUSCULAR | Status: AC
  Filled 2019-03-01: qty 30

## 2019-03-01 MED ORDER — LIDOCAINE-EPINEPHRINE 0.5 %-1:200000 IJ SOLN
INTRAMUSCULAR | Status: AC
  Filled 2019-03-01: qty 1

## 2019-03-01 MED ORDER — LIDOCAINE-EPINEPHRINE 1 %-1:100000 IJ SOLN
INTRAMUSCULAR | Status: AC
  Filled 2019-03-01: qty 1

## 2019-03-01 MED ORDER — MEPERIDINE HCL 25 MG/ML IJ SOLN: 6.2500 mg | INTRAMUSCULAR | Status: DC | PRN

## 2019-03-01 MED ORDER — CEFAZOLIN SODIUM-DEXTROSE 2-4 GM/100ML-% IV SOLN
2.0000 g | INTRAVENOUS | Status: AC
  Administered 2019-03-01: 2 g via INTRAVENOUS
  Filled 2019-03-01: qty 100

## 2019-03-01 MED ORDER — MIDAZOLAM HCL 5 MG/5ML IJ SOLN
INTRAMUSCULAR | Status: DC | PRN
  Administered 2019-03-01: 2 mg via INTRAVENOUS

## 2019-03-01 MED ORDER — LIDOCAINE-EPINEPHRINE 1 %-1:100000 IJ SOLN
INTRAMUSCULAR | Status: DC | PRN
  Administered 2019-03-01: 20 mL

## 2019-03-01 MED ORDER — DIPHENHYDRAMINE HCL 12.5 MG/5ML PO ELIX: 12.5000 mg | ORAL_SOLUTION | Freq: Four times a day (QID) | ORAL | Status: DC | PRN

## 2019-03-01 MED ORDER — CHLORHEXIDINE GLUCONATE CLOTH 2 % EX PADS: 6.0000 | MEDICATED_PAD | Freq: Once | CUTANEOUS | Status: DC

## 2019-03-01 MED ORDER — FENTANYL CITRATE (PF) 250 MCG/5ML IJ SOLN
INTRAMUSCULAR | Status: AC
  Filled 2019-03-01: qty 5

## 2019-03-01 MED ORDER — SENNA 8.6 MG PO TABS
1.0000 | ORAL_TABLET | Freq: Two times a day (BID) | ORAL | Status: DC
  Administered 2019-03-01 – 2019-03-02 (×3): 8.6 mg via ORAL
  Filled 2019-03-01 (×3): qty 1

## 2019-03-01 MED ORDER — CEFAZOLIN SODIUM-DEXTROSE 2-3 GM-%(50ML) IV SOLR: INTRAVENOUS | Status: DC | PRN

## 2019-03-01 MED ORDER — HYDROMORPHONE HCL 1 MG/ML IJ SOLN
INTRAMUSCULAR | Status: AC
  Filled 2019-03-01: qty 1

## 2019-03-01 MED ORDER — BUPIVACAINE HCL (PF) 0.25 % IJ SOLN
INTRAMUSCULAR | Status: DC | PRN
  Administered 2019-03-01: 20 mL

## 2019-03-01 MED ORDER — MIDAZOLAM HCL 2 MG/2ML IJ SOLN
INTRAMUSCULAR | Status: AC
  Filled 2019-03-01: qty 2

## 2019-03-01 MED ORDER — ONDANSETRON 4 MG PO TBDP: 4.0000 mg | ORAL_TABLET | Freq: Four times a day (QID) | ORAL | Status: DC | PRN

## 2019-03-01 MED ORDER — HYDROCODONE-ACETAMINOPHEN 5-325 MG PO TABS: 1.0000 | ORAL_TABLET | Freq: Four times a day (QID) | ORAL | Status: DC | PRN

## 2019-03-01 MED ORDER — DEXAMETHASONE SODIUM PHOSPHATE 10 MG/ML IJ SOLN
INTRAMUSCULAR | Status: AC
  Filled 2019-03-01: qty 1

## 2019-03-01 MED ORDER — DIPHENHYDRAMINE HCL 50 MG/ML IJ SOLN: 12.5000 mg | Freq: Four times a day (QID) | INTRAMUSCULAR | Status: DC | PRN

## 2019-03-01 MED ORDER — PHENYLEPHRINE HCL-NACL 10-0.9 MG/250ML-% IV SOLN
INTRAVENOUS | Status: DC | PRN
  Administered 2019-03-01: 25 ug/min via INTRAVENOUS

## 2019-03-01 MED ORDER — SCOPOLAMINE 1 MG/3DAYS TD PT72
MEDICATED_PATCH | TRANSDERMAL | Status: AC
  Filled 2019-03-01: qty 1

## 2019-03-01 MED ORDER — SODIUM CHLORIDE (PF) 0.9 % IJ SOLN
INTRAMUSCULAR | Status: AC
  Filled 2019-03-01: qty 50

## 2019-03-01 MED ORDER — HYDROMORPHONE HCL 1 MG/ML IJ SOLN
1.0000 mg | INTRAMUSCULAR | Status: DC | PRN
  Administered 2019-03-01: 1 mg via INTRAVENOUS
  Filled 2019-03-01: qty 1

## 2019-03-01 MED ORDER — ONDANSETRON HCL 4 MG/2ML IJ SOLN: 4.0000 mg | Freq: Once | INTRAMUSCULAR | Status: DC | PRN

## 2019-03-01 MED ORDER — DIPHENHYDRAMINE HCL 50 MG/ML IJ SOLN
INTRAMUSCULAR | Status: DC | PRN
  Administered 2019-03-01: 6.25 mg via INTRAVENOUS

## 2019-03-01 MED ORDER — ROCURONIUM BROMIDE 10 MG/ML (PF) SYRINGE
PREFILLED_SYRINGE | INTRAVENOUS | Status: DC | PRN
  Administered 2019-03-01: 80 mg via INTRAVENOUS

## 2019-03-01 MED ORDER — HYDROMORPHONE HCL 1 MG/ML IJ SOLN
0.2500 mg | INTRAMUSCULAR | Status: DC | PRN
  Administered 2019-03-01 (×2): 0.5 mg via INTRAVENOUS

## 2019-03-01 MED ORDER — ACETAMINOPHEN 325 MG PO TABS
325.0000 mg | ORAL_TABLET | Freq: Four times a day (QID) | ORAL | Status: DC
  Administered 2019-03-01 – 2019-03-02 (×3): 325 mg via ORAL
  Filled 2019-03-01 (×3): qty 1

## 2019-03-01 SURGICAL SUPPLY — 55 items
ADH SKN CLS APL DERMABOND .7 (GAUZE/BANDAGES/DRESSINGS) ×4
APL PRP STRL LF DISP 70% ISPRP (MISCELLANEOUS) ×2
BAG DECANTER FOR FLEXI CONT (MISCELLANEOUS) ×2 IMPLANT
BINDER BREAST LRG (GAUZE/BANDAGES/DRESSINGS) IMPLANT
BINDER BREAST MEDIUM (GAUZE/BANDAGES/DRESSINGS) IMPLANT
BINDER BREAST XLRG (GAUZE/BANDAGES/DRESSINGS) IMPLANT
BINDER BREAST XXLRG (GAUZE/BANDAGES/DRESSINGS) ×1 IMPLANT
BIOPATCH RED 1 DISK 7.0 (GAUZE/BANDAGES/DRESSINGS) ×2 IMPLANT
BLADE HEX COATED 2.75 (ELECTRODE) ×2 IMPLANT
BLADE SURG 15 STRL LF DISP TIS (BLADE) ×2 IMPLANT
BLADE SURG 15 STRL SS (BLADE) ×2
BNDG GAUZE ELAST 4 BULKY (GAUZE/BANDAGES/DRESSINGS) ×2 IMPLANT
CANISTER SUCT 3000ML PPV (MISCELLANEOUS) ×2 IMPLANT
CHLORAPREP W/TINT 26 (MISCELLANEOUS) ×3 IMPLANT
CLSR STERI-STRIP ANTIMIC 1/2X4 (GAUZE/BANDAGES/DRESSINGS) ×2 IMPLANT
COVER SURGICAL LIGHT HANDLE (MISCELLANEOUS) ×1 IMPLANT
COVER WAND RF STERILE (DRAPES) ×2 IMPLANT
DECANTER SPIKE VIAL GLASS SM (MISCELLANEOUS) ×1 IMPLANT
DERMABOND ADVANCED (GAUZE/BANDAGES/DRESSINGS) ×4
DERMABOND ADVANCED .7 DNX12 (GAUZE/BANDAGES/DRESSINGS) ×2 IMPLANT
DRAIN CHANNEL 19F RND (DRAIN) IMPLANT
DRAPE LAPAROSCOPIC ABDOMINAL (DRAPES) ×2 IMPLANT
DRSG PAD ABDOMINAL 8X10 ST (GAUZE/BANDAGES/DRESSINGS) ×4 IMPLANT
ELECT BLADE 4.0 EZ CLEAN MEGAD (MISCELLANEOUS) ×2
ELECT REM PT RETURN 9FT ADLT (ELECTROSURGICAL) ×2
ELECTRODE BLDE 4.0 EZ CLN MEGD (MISCELLANEOUS) ×1 IMPLANT
ELECTRODE REM PT RTRN 9FT ADLT (ELECTROSURGICAL) ×1 IMPLANT
EVACUATOR SILICONE 100CC (DRAIN) IMPLANT
GLOVE BIO SURGEON STRL SZ 6.5 (GLOVE) ×4 IMPLANT
GOWN STRL REUS W/ TWL LRG LVL3 (GOWN DISPOSABLE) ×2 IMPLANT
GOWN STRL REUS W/TWL LRG LVL3 (GOWN DISPOSABLE) ×8
NDL HYPO 25GX1X1/2 BEV (NEEDLE) IMPLANT
NDL SPNL 22GX3.5 QUINCKE BK (NEEDLE) ×1 IMPLANT
NEEDLE HYPO 25GX1X1/2 BEV (NEEDLE) ×2 IMPLANT
NEEDLE SPNL 22GX3.5 QUINCKE BK (NEEDLE) ×2 IMPLANT
NS IRRIG 1000ML POUR BTL (IV SOLUTION) ×2 IMPLANT
PACK GENERAL/GYN (CUSTOM PROCEDURE TRAY) ×2 IMPLANT
PAD ABD 8X10 STRL (GAUZE/BANDAGES/DRESSINGS) ×2 IMPLANT
SPONGE LAP 18X18 RF (DISPOSABLE) ×3 IMPLANT
SUT MNCRL AB 3-0 PS2 18 (SUTURE) ×1 IMPLANT
SUT MNCRL AB 4-0 PS2 18 (SUTURE) ×6 IMPLANT
SUT MON AB 3-0 SH 27 (SUTURE) ×6
SUT MON AB 3-0 SH27 (SUTURE) IMPLANT
SUT MON AB 5-0 PS2 18 (SUTURE) ×7 IMPLANT
SUT PDS AB 2-0 CT2 27 (SUTURE) IMPLANT
SUT SILK 3 0 PS 1 (SUTURE) IMPLANT
SUT VIC AB 3-0 SH 27 (SUTURE) ×2
SUT VIC AB 3-0 SH 27X BRD (SUTURE) ×2 IMPLANT
SUT VICRYL 4-0 PS2 18IN ABS (SUTURE) ×3 IMPLANT
SYR 50ML LL SCALE MARK (SYRINGE) IMPLANT
SYR CONTROL 10ML LL (SYRINGE) ×1 IMPLANT
TOWEL GREEN STERILE FF (TOWEL DISPOSABLE) ×4 IMPLANT
TUBE CONNECTING 20X1/4 (TUBING) ×2 IMPLANT
UNDERPAD 30X30 (UNDERPADS AND DIAPERS) ×4 IMPLANT
YANKAUER SUCT BULB TIP NO VENT (SUCTIONS) ×2 IMPLANT

## 2019-03-01 NOTE — Transfer of Care (Signed)
Immediate Anesthesia Transfer of Care Note  Patient: Elizabeth Crawford  Procedure(s) Performed: BILATERAL BREAST REDUCTION (Bilateral Breast)  Patient Location: PACU  Anesthesia Type:General  Level of Consciousness: awake, alert  and oriented  Airway & Oxygen Therapy: Patient Spontanous Breathing  Post-op Assessment: Report given to RN, Post -op Vital signs reviewed and stable and Patient moving all extremities  Post vital signs: Reviewed and stable  Last Vitals:  Vitals Value Taken Time  BP 124/87 03/01/19 1027  Temp 37.1 C 03/01/19 1027  Pulse 73 03/01/19 1031  Resp 15 03/01/19 1031  SpO2 93 % 03/01/19 1031  Vitals shown include unvalidated device data.  Last Pain:  Vitals:   03/01/19 0611  TempSrc: Oral  PainSc:       Patients Stated Pain Goal: 4 ([id number])  Complications: No apparent anesthesia complications

## 2019-03-01 NOTE — Anesthesia Preprocedure Evaluation (Signed)
Anesthesia Evaluation  Patient identified by MRN, date of birth, ID band Patient awake    Reviewed: Allergy & Precautions, NPO status , Patient's Chart, lab work & pertinent test results  History of Anesthesia Complications (+) PONV  Airway Mallampati: I  TM Distance: >3 FB Neck ROM: Full    Dental   Pulmonary sleep apnea , former smoker,    Pulmonary exam normal        Cardiovascular Normal cardiovascular exam     Neuro/Psych    GI/Hepatic GERD  Medicated and Controlled,  Endo/Other    Renal/GU      Musculoskeletal   Abdominal   Peds  Hematology   Anesthesia Other Findings   Reproductive/Obstetrics                             Anesthesia Physical Anesthesia Plan  ASA: II  Anesthesia Plan: General   Post-op Pain Management:    Induction:   PONV Risk Score and Plan: 4 or greater and Ondansetron, Midazolam, Dexamethasone and Diphenhydramine  Airway Management Planned: Oral ETT  Additional Equipment:   Intra-op Plan:   Post-operative Plan: Extubation in OR  Informed Consent: I have reviewed the patients History and Physical, chart, labs and discussed the procedure including the risks, benefits and alternatives for the proposed anesthesia with the patient or authorized representative who has indicated his/her understanding and acceptance.       Plan Discussed with: CRNA and Surgeon  Anesthesia Plan Comments:         Anesthesia Quick Evaluation

## 2019-03-01 NOTE — Anesthesia Postprocedure Evaluation (Signed)
Anesthesia Post Note  Patient: Elizabeth Crawford  Procedure(s) Performed: BILATERAL BREAST REDUCTION (Bilateral Breast)     Patient location during evaluation: PACU Anesthesia Type: General Level of consciousness: awake and alert Pain management: pain level controlled Vital Signs Assessment: post-procedure vital signs reviewed and stable Respiratory status: spontaneous breathing, nonlabored ventilation, respiratory function stable and patient connected to nasal cannula oxygen Cardiovascular status: blood pressure returned to baseline and stable Postop Assessment: no apparent nausea or vomiting Anesthetic complications: no    Last Vitals:  Vitals:   03/01/19 1141 03/01/19 1156  BP: 112/70 113/63  Pulse: 65 65  Resp: (!) 22 20  Temp:    SpO2: 93% 92%    Last Pain:  Vitals:   03/01/19 1156  TempSrc:   PainSc: 7                  [name] DAVID

## 2019-03-01 NOTE — Interval H&P Note (Signed)
History and Physical Interval Note:  03/01/2019 7:24 AM  Elizabeth Crawford  has presented today for surgery, with the diagnosis of mammary hypertrophy.  The various methods of treatment have been discussed with the patient and family. After consideration of risks, benefits and other options for treatment, the patient has consented to  Procedure(s) with comments: BREAST REDUCTION WITH LIPOSUCTION (Bilateral) - total case 4 hours as a surgical intervention.  The patient's history has been reviewed, patient examined, no change in status, stable for surgery.  I have reviewed the patient's chart and labs.  Questions were answered to the patient's satisfaction.     [name] [name]

## 2019-03-01 NOTE — Discharge Instructions (Signed)
INSTRUCTIONS FOR AFTER BREAST SURGERY   You are getting ready to undergo breast surgery.  You will likely have some questions about what to expect following your operation.  The following information will help you and your family understand what to expect when you are discharged from the hospital.  Following these guidelines will help ensure a smooth recovery and reduce risks of complications.   Postoperative instructions include information on: diet, wound care, medications and physical activity.  AFTER SURGERY Expect to go home after the procedure.  In some cases, you may need to spend one night in the hospital for observation.  DIET Breast surgery does not require a specific diet.  However, the healthier you eat the better your body can start healing. It is important to increasing your protein intake.  This means limiting the foods with sugar and carbohydrates.  Focus on vegetables and some meat.  If you have any liposuction during your procedure be sure to drink water.  If your urine is bright yellow, then it is concentrated, and you need to drink more water.  As a general rule after surgery, you should have 8 ounces of water every hour while awake.  If you find you are persistently nauseated or unable to take in liquids let us know.  NO TOBACCO USE or EXPOSURE.  This will slow your healing process and increase the risk of a wound.  WOUND CARE If you don't have a drain:  You can shower the day after surgery. Use fragrance free soap.  Dial, [place] and [place] are usually mild on the skin.  If you have steri-strips / tape directly attached to your skin leave them in place. It is OK to get these wet.  No baths, pools or hot tubs for two weeks. We close your incision to leave the smallest and best-looking scar. No ointment or creams on your incisions until given the go ahead.  Especially not Neosporin (Too many skin reactions with this one).  A few weeks after surgery you can use Mederma and start  massaging the scar. We ask you to wear your binder or sports bra for the first 6 weeks around the clock, including while sleeping. This provides added comfort and helps reduce the fluid accumulation at the surgery site.  ACTIVITY No heavy lifting until cleared by the doctor.  This usually means no more than a half-gallon of milk.  It is OK to walk and climb stairs. In fact, moving your legs is very important to decrease your risk of a blood clot.  It will also help keep you from getting deconditioned.  Every 1 to 2 hours get up and walk for 5 minutes. This will help with a quicker recovery back to normal.  Let pain be your guide so you don't do too much.  This is not the time for spring cleaning and don't plan on taking care of anyone else.  This time is for you to recover,  You will be more comfortable if you sleep and rest with your head elevated either with a few pillows under you or in a recliner.  No stomach sleeping for a three months.  WORK Everyone returns to work at different times. As a rough guide, most people take at least 1 - 2 weeks off prior to returning to work. If you need documentation for your job, bring the forms to your postoperative follow up visit.  DRIVING Arrange for someone to bring you home from the hospital.  You may  be able to drive a few days after surgery but not while taking any narcotics or valium.  BOWEL MOVEMENTS Constipation can occur after anesthesia and while taking pain medication.  It is important to stay ahead for your comfort.  We recommend taking Milk of Magnesia (2 tablespoons; twice a day) while taking the pain pills.  SEROMA This is fluid your body tried to put in the surgical site.  This is normal but if it creates tight skinny skin let us know.  It usually decreases in a few weeks.  MEDICATIONS and PAIN CONTROL At your preoperative visit for you history and physical you were given the following medications: 1. An antibiotic: Start this medication  when you get home and take according to the instructions on the bottle. 2. Zofran 4 mg:  This is to treat nausea and vomiting.  You can take this every 6 hours as needed and only if needed. 3. Valium 2 mg: This is for muscle tightness if you have an implant or expander. This will help relax your muscle which also helps with pain control.  This can be taken every 12 hours as needed.  Don't drive after taking this medication. 4. Norco (hydrocodone/acetaminophen) 5/325 mg:  This is only to be used after you have taken the motrin or the tylenol. Every 8 hours as needed. Over the counter Medication to take: 5. Ibuprofen (Motrin) 600 mg:  Take this every 6 hours.  If you have additional pain then take 500 mg of the tylenol.  Only take the Norco after you have tried these two. 6. Miralax or stool softener of choice: Take this according to the bottle if you take the [place] Call your surgeon's office if any of the following occur:  Fever 101 degrees F or greater  Excessive bleeding or fluid from the incision site.  Pain that increases over time without aid from the medications  Redness, warmth, or pus draining from incision sites  Persistent nausea or inability to take in liquids  Severe misshapen area that underwent the operation.

## 2019-03-01 NOTE — Op Note (Signed)
Breast Reduction Op note:    DATE OF PROCEDURE: 03/01/2019  LOCATION: [facility]  SURGEON: Lyndee Leo Sanger Leeana Creer, DO  ASSISTANT: Phoebe Sharps and Roetta Sessions, PA  PREOPERATIVE DIAGNOSIS 1. Macromastia 2. Neck Pain 3. Back Pain  POSTOPERATIVE DIAGNOSIS 1. Macromastia 2. Neck Pain 3. Back Pain  PROCEDURES 1. Bilateral breast reduction.  Right reduction 650 g, Left reduction [id number] g  COMPLICATIONS: None.  DRAINS: none  INDICATIONS FOR PROCEDURE Flo Pignotti is a 73 y.o. year-old female born on [birth year],with a history of symptomatic macromastia with concominant back pain, neck pain, shoulder grooving from her bra.   MRN: [id number]  CONSENT Informed consent was obtained directly from the patient. The risks, benefits and alternatives were fully discussed. Specific risks including but not limited to bleeding, infection, hematoma, seroma, scarring, pain, nipple necrosis, asymmetry, poor cosmetic results, and need for further surgery were discussed. The patient had ample opportunity to have her questions answered to her satisfaction.  DESCRIPTION OF PROCEDURE  Patient was brought into the operating room and placed in a supine position.  SCDs were placed and appropriate padding was performed.  Antibiotics were given. The patient underwent general anesthesia and the chest was prepped and draped in a sterile fashion.  A timeout was performed and all information was confirmed to be correct.  Right side: Preoperative markings were confirmed.  Incision lines were injected with 1% Xylocaine with epinephrine / Marcaine 0.25% mixture.  After waiting for vasoconstriction, the marked lines were incised.  A Wise-pattern superomedial breast reduction was performed by de-epithelializing the pedicle, using bovie to create the superomedial pedicle, and removing breast tissue from the superior, lateral, and inferior portions of the breast.  Care was taken to not undermine  the breast pedicle. Hemostasis was achieved.  The nipple was gently rotated into position and the soft tissue closed with 4-0 Monocryl.   The pocket was irrigated and hemostasis confirmed.  The deep tissues were approximated with 3-0 Monocryl sutures and the skin was closed with deep dermal and subcuticular 4-0 Monocryl sutures.  The nipple and skin flaps had good capillary refill at the end of the procedure.    Left side: Preoperative markings were confirmed.  Incision lines were injected with 1% Xylocaine with epinephrine / Marcaine 0.25% mixture.  After waiting for vasoconstriction, the marked lines were incised.  A Wise-pattern superomedial breast reduction was performed by de-epithelializing the pedicle, using bovie to create the superomedial pedicle, and removing breast tissue from the superior, lateral, and inferior portions of the breast.  Care was taken to not undermine the breast pedicle. Hemostasis was achieved.  The nipple was gently rotated into position and the soft tissue was closed with 4-0 Monocryl.  The patient was sat upright and size and shape symmetry was confirmed.  The pocket was irrigated and hemostasis confirmed.  The deep tissues were approximated with 3-0 Monocryl sutures and the skin was closed with deep dermal and subcuticular 4-0 Monocryl sutures.  Dermabond was applied.  A breast binder and ABDs were placed.  The nipple and skin flaps had good capillary refill at the end of the procedure.  The patient tolerated the procedure well. The patient was allowed to wake from anesthesia and taken to the recovery room in satisfactory condition  The advanced practice practitioner (APP) assisted throughout the case.  The APP was essential in retraction and counter traction when needed to make the case progress smoothly.  This retraction and assistance made it possible to see the  tissue plans for the procedure.  The assistance was needed for blood control, tissue re-approximation and assisted  with closure of the incision site.

## 2019-03-01 NOTE — Anesthesia Procedure Notes (Signed)
Procedure Name: Intubation Date/Time: 03/01/2019 7:47 AM Performed by: Kyung Rudd, CRNA Pre-anesthesia Checklist: Patient identified, Emergency Drugs available, Suction available and Patient being monitored Patient Re-evaluated:Patient Re-evaluated prior to induction Oxygen Delivery Method: Circle system utilized Preoxygenation: Pre-oxygenation with 100% oxygen Induction Type: IV induction Ventilation: Mask ventilation without difficulty Laryngoscope Size: Mac and 3 Grade View: Grade II Tube type: Oral Tube size: 7.0 mm Number of attempts: 1 Airway Equipment and Method: Stylet Placement Confirmation: ETT inserted through vocal cords under direct vision,  positive ETCO2 and breath sounds checked- equal and bilateral Secured at: 21 cm Tube secured with: Tape Dental Injury: Teeth and Oropharynx as per pre-operative assessment

## 2019-03-02 ENCOUNTER — Encounter (HOSPITAL_COMMUNITY): Payer: Self-pay | Admitting: Plastic Surgery

## 2019-03-02 DIAGNOSIS — N62 Hypertrophy of breast: Secondary | ICD-10-CM | POA: Diagnosis not present

## 2019-03-02 LAB — SURGICAL PATHOLOGY

## 2019-03-02 NOTE — Discharge Summary (Signed)
Physician Discharge Summary  Patient ID: Elizabeth Crawford MRN: [id number] DOB/AGE: 1945/12/05 73 y.o.  Admit date: 03/01/2019 Discharge date: 03/02/2019  Admission Diagnoses: Symptomatic mammary hypertrophy  Discharge Diagnoses:  Active Problems:   Symptomatic mammary hypertrophy   Status post bilateral breast reduction   Discharged Condition: good  Hospital Course: Elizabeth Crawford had bilateral breast reduction surgery 03/01/2019 wth Dr. Marla Roe.  No overnight events. Patient reports feeling well today with no complaints.    Patient is stable for discharge.  May shower today. Don't let water directly spray onto incisions, just let water run over them. Pat dry. Wear binder 24/7. Follow-up in office as outpatient 1 week.    Consults: None  Significant Diagnostic Studies: none  Treatments: surgery: Bilateral breast reduction   Discharge Exam: Blood pressure 112/68, pulse 70, temperature 98 F (36.7 C), temperature source Oral, resp. rate 16, height 5\' 4"  (1.626 m), weight 81 kg, SpO2 95 %. General appearance: alert, cooperative and oreiented x 3 Head: Normocephalic, without obvious abnormality, atraumatic Eyes: EOMs intact Resp: normal effort Cardio: regular rate Incision/Wound: Incisions c/d/i, dermabond on incisions, steristrips on lower part of incisions;  breast binder in place.   Disposition: Discharge disposition: 01-Home or Self Care       Discharge Instructions    Call MD for:  difficulty breathing, headache or visual disturbances   Complete by: As directed    Call MD for:  extreme fatigue   Complete by: As directed    Call MD for:  hives   Complete by: As directed    Call MD for:  persistant dizziness or light-headedness   Complete by: As directed    Call MD for:  persistant nausea and vomiting   Complete by: As directed    Call MD for:  redness, tenderness, or signs of infection (pain, swelling, redness, odor or green/yellow discharge around incision site)    Complete by: As directed    Call MD for:  severe uncontrolled pain   Complete by: As directed    Call MD for:  temperature >100.4   Complete by: As directed    Diet - low sodium heart healthy   Complete by: As directed    Increase activity slowly   Complete by: As directed      Allergies as of 03/02/2019      Reactions   Lidocaine Other (See Comments)   UTI (polyuria, dysuria) and facial flushing   Tape    Paper and adhesive tape make blisters and redness and difficult to remove even on the first day   Methylprednisolone Palpitations   Sulfasalazine Nausea Only   Sulfur Nausea And Vomiting      Medication List    TAKE these medications   aspirin 81 MG EC tablet Take 81 mg by mouth daily.   cetirizine 10 MG tablet Commonly known as: ZYRTEC Take 10 mg by mouth daily.   clotrimazole-betamethasone cream Commonly known as: LOTRISONE Apply 1 application topically daily as needed (itching with skin folds).   multivitamin with minerals Tabs tablet Take 1 tablet by mouth 4 (four) times a week.   naproxen sodium 220 MG tablet Commonly known as: ALEVE Take 220-440 mg by mouth 2 (two) times daily as needed (pain).   ondansetron 4 MG tablet Commonly known as: Zofran Take 1 tablet (4 mg total) by mouth every 8 (eight) hours as needed for up to 5 days. What changed: reasons to take this   rosuvastatin 5 MG tablet Commonly known as:  CRESTOR Take 5 mg by mouth at bedtime.      Follow-up Information    Dillingham, [name], DO In 1 week.   Specialty: Plastic Surgery Contact information: [address] [place] [address] [phone]          Dr. Lyndee Leo [facility] Dillingham [address] [place], [place]  [id number]  Signed: Threasa Heads 03/02/2019, 10:01 AM

## 2019-03-03 ENCOUNTER — Telehealth: Payer: Self-pay | Admitting: Plastic Surgery

## 2019-03-03 ENCOUNTER — Ambulatory Visit: Payer: Medicare Other | Admitting: Physician Assistant

## 2019-03-03 NOTE — Telephone Encounter (Signed)
Called and spoke with the patient regarding the message below.  Per Matthew,PA-can start the Keflex now.  Patient verbalized understanding and agreed.//AB/CMA

## 2019-03-03 NOTE — Telephone Encounter (Signed)
Pt wanted to know if she should start taking Keflex generic medication today. She couldn't remember what she should do post op. Please call on mobile to advise medications she should take and when.

## 2019-03-04 ENCOUNTER — Telehealth: Payer: Self-pay

## 2019-03-04 MED ORDER — TRIAMCINOLONE ACETONIDE 0.025 % EX OINT: 1.0000 "application " | TOPICAL_OINTMENT | Freq: Two times a day (BID) | CUTANEOUS | 0 refills | Status: DC

## 2019-03-04 NOTE — Telephone Encounter (Signed)
Called and spoke with Elizabeth Crawford. She reports that a few days after surgery she started getting very itchy in the area around her breasts and on her breasts.  She started using cetirizine today.  She also has a few medications at home that she wanted to try, including mupirocin, betamethasone and clotrimazole.  I recommend triamcinolone ointment to the area where she has her rash - I have sent this to her pharmacy. I also recommend Benadryl at night x 3 weeks and continue with cetirizine daily for 3 weeks.  She is also taking Norco for pain and she reports this makes her little bit sleepy.  Be cautious when taking Benadryl and Norco at the same time.  She is going to try it tonight and space out each medication. Recommend APAP/ibuprofen instead of norco if taking benadryl.  Call with questions or concerns. Patient scheduled for post-op shortly.

## 2019-03-04 NOTE — Telephone Encounter (Signed)
Elizabeth Crawford called complaining of severe itching after breast reduction on 03/01/2019. She would like to know treatment options.

## 2019-03-09 ENCOUNTER — Other Ambulatory Visit: Payer: Self-pay

## 2019-03-09 ENCOUNTER — Ambulatory Visit (INDEPENDENT_AMBULATORY_CARE_PROVIDER_SITE_OTHER): Payer: Medicare Other | Admitting: Plastic Surgery

## 2019-03-09 ENCOUNTER — Encounter: Payer: Self-pay | Admitting: Plastic Surgery

## 2019-03-09 VITALS — BP 109/70 | HR 82 | Temp 96.8°F | Ht 64.0 in | Wt 175.0 lb

## 2019-03-09 DIAGNOSIS — N62 Hypertrophy of breast: Secondary | ICD-10-CM

## 2019-03-09 MED ORDER — DOXYCYCLINE HYCLATE 100 MG PO TABS: 100.0000 mg | ORAL_TABLET | Freq: Two times a day (BID) | ORAL | 0 refills | Status: AC

## 2019-03-09 NOTE — Progress Notes (Signed)
The patient is a 73 yrs old wf here with her husband for follow up on her breast reduction.  Overall she is doing well.  The incisions are intact.  There is swelling and bruising as expected.  There is presence of seroma on each side.  A sterile technique was used to do a percutaneous drainage of each side.  There was 150 cc of serosanguinous aspirated from each breast.  The patient felt better.  There was a small amount of redness in the left breast.  The patient was not able to finish the Abx due to the smell.  Doxy was sent in today.

## 2019-03-11 ENCOUNTER — Telehealth: Payer: Self-pay | Admitting: Plastic Surgery

## 2019-03-11 NOTE — Telephone Encounter (Signed)
Pt left a voicemail stating that her left breast is really red and is oozing a yellow pus. Please call her back to let her know what she should do or if she needs to come in.

## 2019-03-12 NOTE — Telephone Encounter (Signed)
Pt called back still concerned about your breast being red and puffy, but remains unchanged. Her schedule would not allow her to come today. Scheduled her an appt for Monday with Mayah, but told her to go to ED if something worsened between now and then. Please call her to discuss concerns.

## 2019-03-15 ENCOUNTER — Encounter: Payer: Self-pay | Admitting: Nurse Practitioner

## 2019-03-15 ENCOUNTER — Ambulatory Visit (INDEPENDENT_AMBULATORY_CARE_PROVIDER_SITE_OTHER): Payer: Medicare Other | Admitting: Nurse Practitioner

## 2019-03-15 ENCOUNTER — Other Ambulatory Visit: Payer: Self-pay

## 2019-03-15 VITALS — BP 111/68 | HR 73 | Temp 97.5°F | Ht 64.0 in | Wt 174.2 lb

## 2019-03-15 DIAGNOSIS — Z9889 Other specified postprocedural states: Secondary | ICD-10-CM

## 2019-03-15 MED ORDER — DOXYCYCLINE HYCLATE 100 MG PO TABS: 100.0000 mg | ORAL_TABLET | Freq: Two times a day (BID) | ORAL | 0 refills | Status: AC

## 2019-03-15 NOTE — Progress Notes (Signed)
Patient ID: Elizabeth Crawford, female    DOB: 08-08-1945, 73 y.o.   MRN: [id number]   C.C.: post-op breast reduction  Elizabeth Crawford is a 73 yo female who presents for follow up after bilateral breast reduction on 03/01/19. She had 150 ml serosanguinous fluid aspirated from each breast last week. She was prescribed a 5 day course of doxycycline due to redness on the left breast. Patient states she completed the doxycycline without any issues. Patient states the pain is much better today. She rates her pain as 2/10 today. Patient states the oozing from the left nipple has stopped, but the redness on the left breast is "still firey." Denies any fevers, chills, n/v. She is wearing a sports bra or binder 24/7.   Review of Systems  Constitutional: Positive for activity change. Negative for fever.  HENT: Negative.   Respiratory: Negative.   Cardiovascular: Negative.   Gastrointestinal: Negative.   Genitourinary: Negative.   Musculoskeletal: Negative.   Skin:       Slight itchiness at breast incisions  Neurological: Negative.     Past Medical History:  Diagnosis Date  . Allergy   . Arthritis   . Complication of anesthesia   . DVT (deep venous thrombosis) ([place])   . GERD (gastroesophageal reflux disease)   . H/O seasonal allergies   . PONV (postoperative nausea and vomiting)    Gets sick every time  . Pulmonary embolism ([place])   . Sleep apnea   . Spondylolisthesis     Past Surgical History:  Procedure Laterality Date  . BACK SURGERY     x2  . BLADDER SUSPENSION  2009  . BREAST REDUCTION SURGERY Bilateral 03/01/2019   Procedure: BILATERAL BREAST REDUCTION;  Surgeon: Wallace Going, DO;  Location: [place];  Service: Plastics;  Laterality: Bilateral;  total case 4 hours  . CARPAL TUNNEL RELEASE  2003   Left hand  . CHOLECYSTECTOMY  2000   Laparoscopic  . COLONOSCOPY W/ POLYPECTOMY  2014  . COLONOSCOPY WITH PROPOFOL N/A 05/22/2017   Procedure: COLONOSCOPY WITH PROPOFOL;  Surgeon:  Lollie Sails, MD;  Location: [facility] ENDOSCOPY;  Service: Endoscopy;  Laterality: N/A;  . DECOMPRESSIVE LUMBAR LAMINECTOMY LEVEL 2 N/A 11/09/2015   Procedure: DECOMPRESSION L2-5 REMOVAL PEDICULE SCREWS L4-5;  Surgeon: Melina Schools, MD;  Location: [place];  Service: Orthopedics;  Laterality: N/A;  . ESOPHAGOGASTRODUODENOSCOPY (EGD) WITH PROPOFOL N/A 10/09/2018   Procedure: ESOPHAGOGASTRODUODENOSCOPY (EGD) WITH PROPOFOL;  Surgeon: Lollie Sails, MD;  Location: [facility] ENDOSCOPY;  Service: Endoscopy;  Laterality: N/A;  . EYE SURGERY Bilateral 2005   cataract surgery   . HARDWARE REMOVAL N/A 11/09/2015   Procedure: HARDWARE REMOVAL;  Surgeon: Melina Schools, MD;  Location: [place];  Service: Orthopedics;  Laterality: N/A;  . [place]  . IVC filter placement  02-01-10  . REDUCTION MAMMAPLASTY Bilateral 03/01/2019  . SPINE SURGERY  5 23-12   anterolateral fusion/ rodding  . TONSILLECTOMY    . TUBAL LIGATION  1977      Current Outpatient Medications:  .  aspirin 81 MG EC tablet, Take 81 mg by mouth daily. , Disp: , Rfl:  .  cetirizine (ZYRTEC) 10 MG tablet, Take 10 mg by mouth daily., Disp: , Rfl:  .  clotrimazole-betamethasone (LOTRISONE) cream, Apply 1 application topically daily as needed (itching with skin folds). , Disp: , Rfl:  .  Multiple Vitamin (MULTIVITAMIN WITH MINERALS) TABS tablet, Take 1 tablet by mouth 4 (four) times a  week., Disp: , Rfl:  .  naproxen sodium (ALEVE) 220 MG tablet, Take 220-440 mg by mouth 2 (two) times daily as needed (pain). , Disp: , Rfl:  .  ondansetron (ZOFRAN) 4 MG tablet, Take 1 tablet (4 mg total) by mouth every 8 (eight) hours as needed for up to 5 days., Disp: 15 tablet, Rfl: 0 .  rosuvastatin (CRESTOR) 5 MG tablet, Take 5 mg by mouth at bedtime. , Disp: , Rfl:  .  triamcinolone (KENALOG) 0.025 % ointment, Apply 1 application topically 2 (two) times daily., Disp: 30 g, Rfl: 0 .  doxycycline (VIBRA-TABS) 100 MG tablet, Take 1 tablet (100 mg  total) by mouth 2 (two) times daily for 5 days., Disp: 10 tablet, Rfl: 0   Objective:   Vitals:   03/15/19 0808  BP: 111/68  Pulse: 73  Temp: (!) 97.5 F (36.4 C)  SpO2: 98%    Physical Exam  General: alert, calm, no acute distress HEENT:normocephalic Neck: full ROM Chest: symmetrical rise and fall Lungs: unlabored breathing Breast: bilateral breast incisions clean, dry, intact, dermabond flaking, no drainage, steri-strips intact on vertical limb and IMF incision bilaterally; erythema on medial aspect of left breast, mild yellow ecchymosis on medial aspect of right breast, mild edema of bilateral breasts with L>R, slight fluid wave observed on left breast Musculoskeletal: MAEx4 Neuro: A&O x3, calm, cooperative, steady gait Skin: warm, dry   Assessment & Plan:  Status post bilateral breast reduction  Elizabeth Crawford is a 73 yo female POD #14 s/p bilateral breast reduction. She is more comfortable this week, but continues to have erythema of the left breast. Will prescribe an additional 5 day course of doxycycline. We attempted to aspirate from the left breast, but did not collect any fluid. Continue wearing the sports bra or binder 24/7. Patient is scheduled for follow up on 12/29. If the redness has resolved and there are no other concerns, the appointment can be rescheduled for the following week.      Alfredo Batty, NP

## 2019-03-23 ENCOUNTER — Ambulatory Visit: Payer: Medicare Other | Admitting: Plastic Surgery

## 2019-04-01 ENCOUNTER — Telehealth: Payer: Self-pay

## 2019-04-01 NOTE — Telephone Encounter (Signed)

## 2019-04-02 ENCOUNTER — Ambulatory Visit (INDEPENDENT_AMBULATORY_CARE_PROVIDER_SITE_OTHER): Payer: Medicare Other | Admitting: Plastic Surgery

## 2019-04-02 ENCOUNTER — Other Ambulatory Visit: Payer: Self-pay

## 2019-04-02 ENCOUNTER — Encounter: Payer: Self-pay | Admitting: Plastic Surgery

## 2019-04-02 VITALS — BP 131/79 | HR 69 | Temp 96.9°F | Ht 64.0 in | Wt 171.8 lb

## 2019-04-02 DIAGNOSIS — Z9889 Other specified postprocedural states: Secondary | ICD-10-CM

## 2019-04-02 MED ORDER — DOXYCYCLINE MONOHYDRATE 100 MG PO TABS: 100.0000 mg | ORAL_TABLET | Freq: Two times a day (BID) | ORAL | 0 refills | Status: AC

## 2019-04-02 NOTE — Progress Notes (Signed)
The patient is a 74 year old female here for follow-up on her bilateral breast reduction.  She is happy with her results.  Overall she is doing well.  The swelling and redness has improved.  But she still has redness on the medial aspect of both breasts inferiorly.  I am not sure why but I do not want to ignore it.  There does not appear to be any fluid and there does not appear to be an infection.  I will send in some more doxycycline for 1 more treatment regimen.  I like to see her back in 3 weeks.  Call with any change or concern.

## 2019-04-05 ENCOUNTER — Telehealth: Payer: Self-pay | Admitting: Plastic Surgery

## 2019-04-05 NOTE — Telephone Encounter (Signed)
Pt called to advise on Saturday morning she had a hole about the size of a pencil eraser on her right breast which leaked. She said the hole has since closed some but it's still leaking. There is no pain other than what she had before and there is no odor. The liquid is yellowish though. Please call her to advise what she should. She would like a nurse or someone to look at it to be sure she's doing everything right.

## 2019-04-06 NOTE — Telephone Encounter (Signed)
Called and spoke with patient on 04/05/2019 about her right breast having some drainage.  She reports the opening was the size of a pencil eraser and it has begun to close up at this time.  She reports the redness of her bilateral breasts has not increased but has not significantly improved.  She has 1 more day of doxycycline..  She has not had any fevers, chills, nausea, vomiting.  The drainage has slowly decreased.  Based on her description it sounds like it is serosanguineous in nature.  She has not noticed any purulence or milky white drainage.  Her breasts are not tender.  Recommended to finish her antibiotic and call us if she develops any additional redness after finishing the antibiotic.  Recommend Vaseline and gauze to the open wounds.  Reasons to call provided.  Patient reports that she will be sure to call if she has any issues.

## 2019-04-22 ENCOUNTER — Telehealth: Payer: Self-pay

## 2019-04-22 NOTE — Telephone Encounter (Signed)

## 2019-04-23 ENCOUNTER — Encounter: Payer: Self-pay | Admitting: Plastic Surgery

## 2019-04-23 ENCOUNTER — Ambulatory Visit (INDEPENDENT_AMBULATORY_CARE_PROVIDER_SITE_OTHER): Payer: Medicare Other | Admitting: Plastic Surgery

## 2019-04-23 ENCOUNTER — Other Ambulatory Visit: Payer: Self-pay

## 2019-04-23 VITALS — BP 121/76 | HR 71 | Temp 97.1°F | Ht 64.0 in | Wt 168.0 lb

## 2019-04-23 DIAGNOSIS — N62 Hypertrophy of breast: Secondary | ICD-10-CM

## 2019-04-23 DIAGNOSIS — Z9889 Other specified postprocedural states: Secondary | ICD-10-CM

## 2019-04-23 NOTE — Progress Notes (Signed)
   Subjective:    Patient ID: Elizabeth Crawford, female    DOB: 22-Dec-1945, 74 y.o.   MRN: [id number]  The patient is a 74 year old female here with her husband for follow-up on her breast reduction.  She is doing much better.  The redness has resolved.  She has a little bit of outward puckering on the right breast at the inframammary fold and vertical line juncture.  On the other side she has a inward puckering in the same area.  There is no sign of infection and the incisions are healing nicely.     Review of Systems  Constitutional: Negative.   HENT: Negative.   Eyes: Negative.   Respiratory: Negative.   Cardiovascular: Negative.   Endocrine: Negative.   Genitourinary: Negative.   Hematological: Negative.   Psychiatric/Behavioral: Negative.        Objective:   Physical Exam Vitals and nursing note reviewed.  Constitutional:      Appearance: Normal appearance.  Cardiovascular:     Rate and Rhythm: Normal rate.     Pulses: Normal pulses.  Pulmonary:     Effort: Pulmonary effort is normal.  Neurological:     General: No focal deficit present.     Mental Status: She is alert and oriented to person, place, and time.  Psychiatric:        Mood and Affect: Mood normal.        Behavior: Behavior normal.        Thought Content: Thought content normal.        Assessment & Plan:     ICD-10-CM   1. Status post bilateral breast reduction  Z98.890   2. Symptomatic mammary hypertrophy  N62     Continue massage to both breasts at the inframammary fold.  I would like to see her back in a month to see how she is doing.  We can certainly do a revision if needed.  I am hoping that the scarring will relax a little bit on both sides.

## 2019-05-05 ENCOUNTER — Other Ambulatory Visit: Payer: Self-pay

## 2019-05-05 ENCOUNTER — Encounter: Payer: Self-pay | Admitting: Surgery

## 2019-05-05 ENCOUNTER — Ambulatory Visit (INDEPENDENT_AMBULATORY_CARE_PROVIDER_SITE_OTHER): Payer: Medicare Other | Admitting: Surgery

## 2019-05-05 VITALS — BP 108/74 | HR 84 | Temp 97.9°F | Resp 12 | Ht 64.0 in | Wt 167.4 lb

## 2019-05-05 DIAGNOSIS — R112 Nausea with vomiting, unspecified: Secondary | ICD-10-CM | POA: Diagnosis not present

## 2019-05-05 DIAGNOSIS — R110 Nausea: Secondary | ICD-10-CM | POA: Diagnosis not present

## 2019-05-05 MED ORDER — METOCLOPRAMIDE HCL 5 MG PO TABS: 5.0000 mg | ORAL_TABLET | Freq: Three times a day (TID) | ORAL | 0 refills | Status: DC | PRN

## 2019-05-05 MED ORDER — ONDANSETRON 4 MG PO TBDP: 4.0000 mg | ORAL_TABLET | Freq: Four times a day (QID) | ORAL | 0 refills | Status: DC | PRN

## 2019-05-05 NOTE — Patient Instructions (Addendum)
Dr.Pabon discussed with patient at today's visit. We will schedule patient for a Barium Swallow procedure. Patient was also prescribed Reglan and Zofran prescription(s) to local pharmacy at today's visit to help with Nausea and Vomiting.   Patient is scheduled for Barium Swallow on Friday 05/07/19 arrival time is 10:15am at [facility]. Patient is not to have any thing to eat or drink three hours prior to procedure.

## 2019-05-07 ENCOUNTER — Ambulatory Visit
Admission: RE | Admit: 2019-05-07 | Discharge: 2019-05-07 | Disposition: A | Discharge status: Home / Self Care | Payer: Medicare Other | Source: Ambulatory Visit | Attending: Surgery | Admitting: Surgery

## 2019-05-07 ENCOUNTER — Other Ambulatory Visit: Payer: Self-pay

## 2019-05-07 DIAGNOSIS — R112 Nausea with vomiting, unspecified: Secondary | ICD-10-CM | POA: Insufficient documentation

## 2019-05-10 ENCOUNTER — Encounter: Payer: Self-pay | Admitting: Surgery

## 2019-05-10 ENCOUNTER — Telehealth: Payer: Self-pay

## 2019-05-10 NOTE — Telephone Encounter (Signed)
Spoke with patient to notify her of recent Xray results per Dr.Pabon. Patient verbalized understanding and will see [place] at her next scheduled appointment which is 05/19/19 at 2:45pm with Dr.Pabon.

## 2019-05-10 NOTE — Telephone Encounter (Signed)
-----   Message from Jules Husbands, MD sent at 05/07/2019  2:34 PM EST ----- Please let her know the xray show nothing alarming, Repair is intact. Please continue reglan and zofran  ----- Message ----- From: Interface, Rad Results In Sent: 05/07/2019  11:46 AM EST To: Jules Husbands, MD

## 2019-05-10 NOTE — Progress Notes (Signed)
Outpatient Surgical Follow Up  05/10/2019  Elizabeth Crawford is an 74 y.o. female.   Chief Complaint  Patient presents with  . Follow-up    Robotic Paraesophageal Hernia repair 10/27/2018-patient experiencing vomiting episodes    HPI: Elizabeth Crawford is a 74 year old female status post paraesophageal hernia repair on August 2020 she recently had an episode of nausea and vomiting.  She reports that this is not related to any food.  She has been able to keep things down.  This happens intermittently and has been happening for the last few months without any prodrome symptoms.  She did have a CT scan that I performed on November last year showing an intact Nissen and there was a tiny hiatal hernia with much improvement as compared to before.  More importantly she reports that she does not have any reflux and her respiratory symptoms are coming quickly improved after the fundoplication.    Past Medical History:  Diagnosis Date  . Allergy   . Arthritis   . Complication of anesthesia   . DVT (deep venous thrombosis) ([place])   . GERD (gastroesophageal reflux disease)   . H/O seasonal allergies   . PONV (postoperative nausea and vomiting)    Gets sick every time  . Pulmonary embolism ([place])   . Sleep apnea   . Spondylolisthesis     Past Surgical History:  Procedure Laterality Date  . BACK SURGERY     x2  . BLADDER SUSPENSION  2009  . BREAST REDUCTION SURGERY Bilateral 03/01/2019   Procedure: BILATERAL BREAST REDUCTION;  Surgeon: Wallace Going, DO;  Location: [place];  Service: Plastics;  Laterality: Bilateral;  total case 4 hours  . CARPAL TUNNEL RELEASE  2003   Left hand  . CHOLECYSTECTOMY  2000   Laparoscopic  . COLONOSCOPY W/ POLYPECTOMY  2014  . COLONOSCOPY WITH PROPOFOL N/A 05/22/2017   Procedure: COLONOSCOPY WITH PROPOFOL;  Surgeon: Lollie Sails, MD;  Location: [facility] ENDOSCOPY;  Service: Endoscopy;  Laterality: N/A;  . DECOMPRESSIVE LUMBAR LAMINECTOMY LEVEL 2 N/A 11/09/2015   Procedure: DECOMPRESSION L2-5 REMOVAL PEDICULE SCREWS L4-5;  Surgeon: Melina Schools, MD;  Location: [place];  Service: Orthopedics;  Laterality: N/A;  . ESOPHAGOGASTRODUODENOSCOPY (EGD) WITH PROPOFOL N/A 10/09/2018   Procedure: ESOPHAGOGASTRODUODENOSCOPY (EGD) WITH PROPOFOL;  Surgeon: Lollie Sails, MD;  Location: [facility] ENDOSCOPY;  Service: Endoscopy;  Laterality: N/A;  . EYE SURGERY Bilateral 2005   cataract surgery   . HARDWARE REMOVAL N/A 11/09/2015   Procedure: HARDWARE REMOVAL;  Surgeon: Melina Schools, MD;  Location: [place];  Service: Orthopedics;  Laterality: N/A;  . [place]  . IVC filter placement  02-01-10  . REDUCTION MAMMAPLASTY Bilateral 03/01/2019  . SPINE SURGERY  5 23-12   anterolateral fusion/ rodding  . TONSILLECTOMY    . TUBAL LIGATION  1977    Family History  Problem Relation Age of Onset  . COPD Mother   . Heart disease Mother   . Aortic aneurysm Mother   . Heart disease Father     Social History:  reports that she quit smoking about 43 years ago. Her smoking use included cigarettes. She quit after 15.00 years of use. She has never used smokeless tobacco. She reports current alcohol use. She reports that she does not use drugs.  Allergies:  Allergies  Allergen Reactions  . Lidocaine Other (See Comments)    UTI (polyuria, dysuria) and facial flushing  . Tape     Paper and adhesive tape make blisters  and redness and difficult to remove even on the first day  . Methylprednisolone Palpitations  . Sulfasalazine Nausea Only  . Sulfur Nausea And Vomiting    Medications reviewed.    ROS Full ROS performed and is otherwise negative other than what is stated in HPI   BP 108/74   Pulse 84   Temp 97.9 F (36.6 C)   Resp 12   Ht 5\' 4"  (1.626 m)   Wt 167 lb 6.4 oz (75.9 kg)   SpO2 95%   BMI 28.73 kg/m   Physical Exam Vitals and nursing note reviewed. Exam conducted with a chaperone present.  Constitutional:      General: She is not in  acute distress.    Appearance: Normal appearance. She is normal weight.  Cardiovascular:     Rate and Rhythm: Normal rate and regular rhythm.  Pulmonary:     Effort: Pulmonary effort is normal. No respiratory distress.     Breath sounds: Normal breath sounds. No stridor.  Abdominal:     General: Abdomen is flat. There is no distension.     Palpations: There is no mass.     Tenderness: There is no abdominal tenderness. There is no guarding.  Skin:    General: Skin is warm and dry.     Capillary Refill: Capillary refill takes less than 2 seconds.  Neurological:     General: No focal deficit present.     Mental Status: She is alert and oriented to person, place, and time.  Psychiatric:        Mood and Affect: Mood normal.        Behavior: Behavior normal.        Thought Content: Thought content normal.        Judgment: Judgment normal.      Assessment/Plan:  Chronic nausea some occasional vomiting in the setting of repair of paraesophageal hernia with fundoplication.  We will start her on some Reglan and Zofran as needed.  We will also obtain esophagogram to make sure there is no slipped Nissen or anything that we need to address.  Currently she seems to be in no distress and can be follow in a few weeks as an outpatient.  Greater than 50% of the 25 minutes  visit was spent in counseling/coordination of care   Caroleen Hamman, MD [place] Surgeon

## 2019-05-19 ENCOUNTER — Other Ambulatory Visit: Payer: Self-pay

## 2019-05-19 ENCOUNTER — Ambulatory Visit (INDEPENDENT_AMBULATORY_CARE_PROVIDER_SITE_OTHER): Payer: Medicare Other | Admitting: Surgery

## 2019-05-19 ENCOUNTER — Encounter: Payer: Self-pay | Admitting: Surgery

## 2019-05-19 VITALS — BP 124/75 | Temp 97.2°F | Ht 64.0 in | Wt 171.0 lb

## 2019-05-19 DIAGNOSIS — R110 Nausea: Secondary | ICD-10-CM | POA: Diagnosis not present

## 2019-05-19 NOTE — Progress Notes (Signed)
Gilmore Laroche status post paraesophageal hernia repair with fundoplication on August [id number].  She does have some intermittent nausea and has improved significantly with Zofran.  I did prescribe Reglan the last time but I am not sure if she was able to actually take it.  No fevers no chills she is swallowing well.  Normal bowel movements.  No dysphagia. He did have barium swallow that I have personally reviewed showing evidence of an intact wrap no significant hernia as compared to preoperative evaluation.  PE NAD Abd: soft, nt  A/p Doing well Nausea resolving and I again encouraged her to do the Reglan as needed. Would anticipate that there is more of a functional problem that will resolve with time.  She is content with her results.  Follow-up as needed. I spent at least 25 minutes in this encounter with greater than 50% spent coordination counseling of her care

## 2019-05-19 NOTE — Patient Instructions (Addendum)
  Continue to use the Reglan and Zofran as needed. Call if any further problems or your symptoms do not seem to be getting better.   Call your pharmacy for any refills of these medications.

## 2019-05-25 ENCOUNTER — Encounter: Payer: Self-pay | Admitting: Plastic Surgery

## 2019-05-25 ENCOUNTER — Ambulatory Visit (INDEPENDENT_AMBULATORY_CARE_PROVIDER_SITE_OTHER): Payer: Medicare Other | Admitting: Plastic Surgery

## 2019-05-25 ENCOUNTER — Other Ambulatory Visit: Payer: Self-pay

## 2019-05-25 VITALS — BP 126/81 | HR 67 | Temp 97.9°F | Ht 64.0 in | Wt 171.0 lb

## 2019-05-25 DIAGNOSIS — Z9889 Other specified postprocedural states: Secondary | ICD-10-CM

## 2019-05-25 NOTE — Progress Notes (Signed)
The patient is a pleasant 74 year old female here with her husband for follow-up on her bilateral breast reduction.  She is very pleased with her results overall.  She has a little bit of excess tissue on the right breast at the inframammary fold at and the vertical limb juncture.  She has a small amount of scar contracture on the left breast at the same site.  That redness has markedly improved.  There is no sign of infection.  Overall her symmetry is really good.  Do a revision in the office this would be excision of right breast skin excess and release of scar contracture left breast.  Pictures were obtained of the patient and placed in the chart with the patient's or guardian's permission.

## 2019-07-01 ENCOUNTER — Telehealth: Payer: Self-pay | Admitting: Plastic Surgery

## 2019-07-01 NOTE — Telephone Encounter (Signed)
Patient called to say that she is having a procedure on May 11 (Excision of excess right breast tissue and scar contracture release left breast) but she now thinks only one side needs to be done. The left side has loosened up and appears to be doing fine so she thinks only the right side needs to be done. Please call her if there are any questions/concerns.

## 2019-08-03 ENCOUNTER — Ambulatory Visit (INDEPENDENT_AMBULATORY_CARE_PROVIDER_SITE_OTHER): Payer: Medicare Other | Admitting: Plastic Surgery

## 2019-08-03 ENCOUNTER — Encounter: Payer: Self-pay | Admitting: Plastic Surgery

## 2019-08-03 ENCOUNTER — Other Ambulatory Visit: Payer: Self-pay

## 2019-08-03 VITALS — BP 112/72 | HR 76 | Temp 97.1°F | Ht 64.0 in | Wt 173.4 lb

## 2019-08-03 DIAGNOSIS — N641 Fat necrosis of breast: Secondary | ICD-10-CM | POA: Diagnosis not present

## 2019-08-03 DIAGNOSIS — Z9889 Other specified postprocedural states: Secondary | ICD-10-CM | POA: Diagnosis not present

## 2019-08-03 DIAGNOSIS — N6489 Other specified disorders of breast: Secondary | ICD-10-CM | POA: Diagnosis not present

## 2019-08-03 DIAGNOSIS — N651 Disproportion of reconstructed breast: Secondary | ICD-10-CM | POA: Diagnosis not present

## 2019-08-03 DIAGNOSIS — N62 Hypertrophy of breast: Secondary | ICD-10-CM

## 2019-08-03 NOTE — Progress Notes (Signed)
Procedure Note  Preoperative Dx: Right breast asymmetry after breast reduction  Postoperative Dx: Same  Procedure: Excision of right breast excess tissue 1 x 3 cm  Anesthesia: Lidocaine 1% with 1:100,000 epinepherine   Description of Procedure: Risks and complications were explained to the patient.  Consent was confirmed and the patient understands the risks and benefits.  The potential complications and alternatives were explained and the patient consents.  The patient expressed understanding the option of not having the procedure and the risks of a scar.  Time out was called and all information was confirmed to be correct.    The area was prepped and drapped.  Lidocaine 1% with epinepherine was injected in the subcutaneous area.  After waiting several minutes for the local to take affect a #15 blade was used to excise the area in an eliptical pattern for 1 x 3 cm where there was excess tissue at the inframammary fold of the right breast.  A 4-0 Monocryl was used to close the deep layers with simple interrupted stitches.  The skin edges were reapproximated with 5-0 Monocryl subcuticular running closure.  She also had a little tender area that was further superior on the vertical limb.  I numbed this up and it was clear it was a cyst.  A 2 mm incision was made to drain the cyst.  It appeared to be just fat necrosis.  A 5-0 Monocryl was used to close the area.  A dressing was applied with Dermabond and Steri-Strips.  The patient was given instructions on how to care for the area and a follow up appointment.  Elizabeth Crawford tolerated the procedure well and there were no complications.

## 2019-08-10 ENCOUNTER — Ambulatory Visit: Payer: Medicare Other | Admitting: Plastic Surgery

## 2019-08-17 ENCOUNTER — Ambulatory Visit (INDEPENDENT_AMBULATORY_CARE_PROVIDER_SITE_OTHER): Payer: Medicare Other | Admitting: Surgical

## 2019-08-17 ENCOUNTER — Other Ambulatory Visit: Payer: Self-pay

## 2019-08-17 ENCOUNTER — Encounter: Payer: Self-pay | Admitting: Plastic Surgery

## 2019-08-17 VITALS — BP 109/72 | HR 70 | Temp 97.1°F | Ht 64.0 in | Wt 173.0 lb

## 2019-08-17 DIAGNOSIS — N62 Hypertrophy of breast: Secondary | ICD-10-CM

## 2019-08-17 DIAGNOSIS — G8929 Other chronic pain: Secondary | ICD-10-CM

## 2019-08-17 DIAGNOSIS — Z9889 Other specified postprocedural states: Secondary | ICD-10-CM

## 2019-08-17 DIAGNOSIS — M549 Dorsalgia, unspecified: Secondary | ICD-10-CM

## 2019-08-17 DIAGNOSIS — M542 Cervicalgia: Secondary | ICD-10-CM

## 2019-08-17 NOTE — Progress Notes (Signed)
Patient is a 74 year old female here for follow-up after excision of right breast excess tissue 1 x 3 cm on 08/03/2019 by Dr. Marla Roe.  She initially underwent breast reduction in December 2020. Patient reports she is doing really well today.  She is very happy with how she is healing and the improvement after the excision.  She does not have any complaints. Chaperone present on exam Bilateral breast incisions are well-healed, no erythema noted, no drainage noted.  Bilateral NAC's with good color.  No swelling noted.  Patient is doing really well, she is very pleased with her outcome.  After excision of excess breast tissue she is very pleased with the contour and shape of the right breast.  Follow-up in 1 month for reevaluation.  Call with any question or concerns.  Pictures were obtained of the patient and placed in the chart with the patient's or guardian's permission.

## 2019-09-09 ENCOUNTER — Ambulatory Visit (INDEPENDENT_AMBULATORY_CARE_PROVIDER_SITE_OTHER): Payer: Medicare Other | Admitting: Vascular Surgery

## 2019-09-09 ENCOUNTER — Encounter (INDEPENDENT_AMBULATORY_CARE_PROVIDER_SITE_OTHER): Payer: Self-pay | Admitting: Vascular Surgery

## 2019-09-09 ENCOUNTER — Ambulatory Visit (INDEPENDENT_AMBULATORY_CARE_PROVIDER_SITE_OTHER): Payer: Medicare Other

## 2019-09-09 ENCOUNTER — Other Ambulatory Visit: Payer: Self-pay

## 2019-09-09 VITALS — BP 120/78 | HR 70 | Resp 17 | Ht 64.0 in | Wt 173.0 lb

## 2019-09-09 DIAGNOSIS — I872 Venous insufficiency (chronic) (peripheral): Secondary | ICD-10-CM | POA: Diagnosis not present

## 2019-09-09 DIAGNOSIS — K219 Gastro-esophageal reflux disease without esophagitis: Secondary | ICD-10-CM

## 2019-09-09 DIAGNOSIS — Z86718 Personal history of other venous thrombosis and embolism: Secondary | ICD-10-CM

## 2019-09-09 DIAGNOSIS — E7849 Other hyperlipidemia: Secondary | ICD-10-CM | POA: Diagnosis not present

## 2019-09-09 NOTE — Progress Notes (Signed)
MRN : [id number]  Elizabeth Crawford is a 74 y.o. (09-13-1945) female who presents with chief complaint of No chief complaint on file. Marland Kitchen  History of Present Illness:   The patientreturnsto the office for follow up regarding her pastDVT. DVT was identified at [facility] by Duplex ultrasound remotely. The initial symptoms were pain and swelling in the lower extremity.  At that time she had a filter placedat that time. Several months later attempts at removing the filter were unsuccessful. Since then we have been monitoring the patency of the inferior vena cava.  The patient notesher legs are mild to moderatelypainful with dependency and swell. Symptoms are much better with elevation. The patient notes minimal edema in the morning which steadily worsens throughout the day.   The patient has not been using compression therapy at this point.  The patient is status post Nissen fundoplication as well as breast reduction secondary to severe respiratory symptoms.  She did not have any problems with either surgery.  She notes that her symptoms are dramatically improved having completed these 2 procedures.  No SOB or pleuritic chest pains. No cough or hemoptysis.  No blood per rectum or blood in any sputum. No excessive bruising per the patient.  Duplex ultrasound of the inferior vena cava shows the filter is patent and normal flow within the cava  No outpatient medications have been marked as taking for the 09/09/19 encounter (Appointment) with Delana Meyer, Dolores Lory, MD.    Past Medical History:  Diagnosis Date  . Allergy   . Arthritis   . Complication of anesthesia   . DVT (deep venous thrombosis) ([place])   . GERD (gastroesophageal reflux disease)   . H/O seasonal allergies   . PONV (postoperative nausea and vomiting)    Gets sick every time  . Pulmonary embolism ([place])   . Sleep apnea   . Spondylolisthesis     Past Surgical History:  Procedure Laterality Date  . BACK  SURGERY     x2  . BLADDER SUSPENSION  2009  . BREAST REDUCTION SURGERY Bilateral 03/01/2019   Procedure: BILATERAL BREAST REDUCTION;  Surgeon: Wallace Going, DO;  Location: [place];  Service: Plastics;  Laterality: Bilateral;  total case 4 hours  . CARPAL TUNNEL RELEASE  2003   Left hand  . CHOLECYSTECTOMY  2000   Laparoscopic  . COLONOSCOPY W/ POLYPECTOMY  2014  . COLONOSCOPY WITH PROPOFOL N/A 05/22/2017   Procedure: COLONOSCOPY WITH PROPOFOL;  Surgeon: Lollie Sails, MD;  Location: [facility] ENDOSCOPY;  Service: Endoscopy;  Laterality: N/A;  . DECOMPRESSIVE LUMBAR LAMINECTOMY LEVEL 2 N/A 11/09/2015   Procedure: DECOMPRESSION L2-5 REMOVAL PEDICULE SCREWS L4-5;  Surgeon: Melina Schools, MD;  Location: [place];  Service: Orthopedics;  Laterality: N/A;  . ESOPHAGOGASTRODUODENOSCOPY (EGD) WITH PROPOFOL N/A 10/09/2018   Procedure: ESOPHAGOGASTRODUODENOSCOPY (EGD) WITH PROPOFOL;  Surgeon: Lollie Sails, MD;  Location: [facility] ENDOSCOPY;  Service: Endoscopy;  Laterality: N/A;  . EYE SURGERY Bilateral 2005   cataract surgery   . HARDWARE REMOVAL N/A 11/09/2015   Procedure: HARDWARE REMOVAL;  Surgeon: Melina Schools, MD;  Location: [place];  Service: Orthopedics;  Laterality: N/A;  . [place]  . IVC filter placement  02-01-10  . REDUCTION MAMMAPLASTY Bilateral 03/01/2019  . SPINE SURGERY  5 23-12   anterolateral fusion/ rodding  . TONSILLECTOMY    . TUBAL LIGATION  1977    Social History Social History   Tobacco Use  . Smoking status: Former Smoker  Years: 15.00    Types: Cigarettes    Quit date: 02/26/1976    Years since quitting: 43.5  . Smokeless tobacco: Never Used  Vaping Use  . Vaping Use: Never used  Substance Use Topics  . Alcohol use: Yes    Comment: rarely  . Drug use: No    Family History Family History  Problem Relation Age of Onset  . COPD Mother   . Heart disease Mother   . Aortic aneurysm Mother   . Heart disease Father     Allergies    Allergen Reactions  . Lidocaine Other (See Comments)    UTI (polyuria, dysuria) and facial flushing  . Tape     Paper and adhesive tape make blisters and redness and difficult to remove even on the first day  . Methylprednisolone Palpitations  . Sulfasalazine Nausea Only  . Sulfur Nausea And Vomiting     REVIEW OF SYSTEMS (Negative unless checked)  Constitutional: [] Weight loss  [] Fever  [] Chills Cardiac: [] Chest pain   [] Chest pressure   [] Palpitations   [] Shortness of breath when laying flat   [] Shortness of breath with exertion. Vascular:  [] Pain in legs with walking   [] Pain in legs at rest  [x] History of DVT   [] Phlebitis   [x] Swelling in legs   [] Varicose veins   [] Non-healing ulcers Pulmonary:   [] Uses home oxygen   [] Productive cough   [] Hemoptysis   [] Wheeze  [] COPD   [] Asthma Neurologic:  [] Dizziness   [] Seizures   [] History of stroke   [] History of TIA  [] Aphasia   [] Vissual changes   [] Weakness or numbness in arm   [] Weakness or numbness in leg Musculoskeletal:   [] Joint swelling   [] Joint pain   [x] Low back pain Hematologic:  [] Easy bruising  [] Easy bleeding   [] Hypercoagulable state   [] Anemic Gastrointestinal:  [] Diarrhea   [] Vomiting  [] Gastroesophageal reflux/heartburn   [] Difficulty swallowing. Genitourinary:  [] Chronic kidney disease   [] Difficult urination  [] Frequent urination   [] Blood in urine Skin:  [] Rashes   [] Ulcers  Psychological:  [] History of anxiety   []  History of major depression.  Physical Examination  There were no vitals filed for this visit. There is no height or weight on file to calculate BMI. Gen: WD/WN, NAD Head: [place]/AT, No temporalis wasting.  Ear/Nose/Throat: Hearing grossly intact, nares w/o erythema or drainage Eyes: PER, EOMI, sclera nonicteric.  Neck: Supple, no large masses.   Pulmonary:  Good air movement, no audible wheezing bilaterally, no use of accessory muscles.  Cardiac: RRR, no JVD Vascular: scattered varicosities present  bilaterally.  Mild venous stasis changes to the legs bilaterally.  2+ soft pitting edema Vessel Right Left  Radial Palpable Palpable  Gastrointestinal: Non-distended. No guarding/no peritoneal signs.  Musculoskeletal: M/S 5/5 throughout.  No deformity or atrophy.  Neurologic: CN 2-12 intact. Symmetrical.  Speech is fluent. Motor exam as listed above. Psychiatric: Judgment intact, Mood & affect appropriate for pt's clinical situation. Dermatologic: No rashes or ulcers noted.  No changes consistent with cellulitis.   CBC Lab Results  Component Value Date   WBC 10.3 10/28/2018   HGB 12.1 02/25/2019   HCT 38.7 02/25/2019   MCV 95.1 10/28/2018   PLT 134 (L) 10/28/2018    BMET    Component Value Date/Time   NA 142 10/28/2018 0616   K 4.2 10/28/2018 0616   CL 111 10/28/2018 0616   CO2 24 10/28/2018 0616   GLUCOSE 102 (H) 10/28/2018 0616   BUN 11 01/28/2019  1030   CREATININE 0.81 01/28/2019 1030   CALCIUM 8.9 10/28/2018 0616   GFRNONAA >60 01/28/2019 1030   GFRAA >60 01/28/2019 1030   CrCl cannot be calculated (Patient's most recent lab result is older than the maximum 21 days allowed.).  COAG Lab Results  Component Value Date   INR 1.17 08/18/2010   INR 1.09 08/17/2010   INR 1.07 08/15/2010    Radiology No results found.   Assessment/Plan  1. History of DVT (deep vein thrombosis) Recommend:   No surgery or intervention at this point in time.  IVC filter is presentand was unable to be retrieved..  Patient's duplex ultrasound of the venous systemshows the IVC is widely patent and the filter is intact.  The patienthas completed heranticoagulation   Elevation was stressed, use of a recliner was discussed.  I have had a long discussion with the patient regarding DVT and post phlebitic changes such as swelling and why it causes symptoms such as pain. The patient will wear graduated compression stockings class 1 (20-30 mmHg), beginning after three full days  of anticoagulation, on a daily basis a prescription was given. The patient will beginning wearing the stockings first thing in the morning and removing them in the evening. The patient is instructed specifically not to sleep in the stockings. In addition, behavioral modification including elevation during the day and avoidance of prolonged dependency will be initiated.   The patient will continue anticoagulation for now as there have not been any problems or complications at this point. - VAS US AORTA/IVC/ILIACS; Future  2. Chronic venous insufficiency Recommend:   No surgery or intervention at this point in time.  IVC filter is presentand was unable to be retrieved..  Patient's duplex ultrasound of the venous systemshows the IVC is widely patent and the filter is intact.  The patienthas completed heranticoagulation   Elevation was stressed, use of a recliner was discussed.  I have had a long discussion with the patient regarding DVT and post phlebitic changes such as swelling and why it causes symptoms such as pain. The patient will wear graduated compression stockings class 1 (20-30 mmHg), beginning after three full days of anticoagulation, on a daily basis a prescription was given. The patient will beginning wearing the stockings first thing in the morning and removing them in the evening. The patient is instructed specifically not to sleep in the stockings. In addition, behavioral modification including elevation during the day and avoidance of prolonged dependency will be initiated.   The patient will continue anticoagulation for now as there have not been any problems or complications at this point.  3. Gastroesophageal reflux disease without esophagitis Continue PPI as already ordered, this medication has been reviewed and there are no changes at this time.  Avoidence of caffeine and alcohol  Moderate elevation of the head of the bed   4. Other  hyperlipidemia Continue statin as ordered and reviewed, no changes at this time    Hortencia Pilar, MD  09/09/2019 8:51 AM

## 2019-09-21 ENCOUNTER — Other Ambulatory Visit: Payer: Self-pay

## 2019-09-21 ENCOUNTER — Ambulatory Visit (INDEPENDENT_AMBULATORY_CARE_PROVIDER_SITE_OTHER): Payer: Medicare Other | Admitting: Surgical

## 2019-09-21 ENCOUNTER — Encounter: Payer: Self-pay | Admitting: Surgical

## 2019-09-21 VITALS — BP 115/66 | HR 74 | Temp 97.1°F

## 2019-09-21 DIAGNOSIS — N62 Hypertrophy of breast: Secondary | ICD-10-CM

## 2019-09-21 DIAGNOSIS — Z9889 Other specified postprocedural states: Secondary | ICD-10-CM

## 2019-09-21 NOTE — Progress Notes (Signed)
   Subjective:     Patient ID: Elizabeth Crawford, female    DOB: April 16, 1945, 74 y.o.   MRN: [id number]  Chief Complaint  Patient presents with  . Follow-up    HPI: The patient is a 74 y.o. female here for follow-up for bilateral breast reduction in December 2020.  She additionally underwent excision of right breast excess tissue on 08/03/2019 with Dr. Marla Crawford.  She reports that she is doing great and has no complaints at this time.  She is very happy with the outcome and reports she wishes she had the breast reduction many years ago.    Review of Systems  Respiratory: Negative.   Cardiovascular: Negative.   Skin: Negative.      Objective:   Vital Signs BP 115/66 (BP Location: Right Arm, Patient Position: Sitting, Cuff Size: Large)   Pulse 74   Temp (!) 97.1 F (36.2 C) (Temporal)   SpO2 97%  Vital Signs and Nursing Note Reviewed  Physical Exam Constitutional:      Appearance: Normal appearance.  Pulmonary:     Effort: Pulmonary effort is normal.  Chest:       Comments: Bilateral breasts are soft, NAC complexes with good cap refill and color.  No wounds noted.  Incisions are healing nicely.  No erythema.  No tenderness to palpation. Neurological:     General: No focal deficit present.     Mental Status: She is alert and oriented to person, place, and time. Mental status is at baseline.  Psychiatric:        Mood and Affect: Mood normal.        Behavior: Behavior normal.       Assessment/Plan:     ICD-10-CM   1. Symptomatic mammary hypertrophy  N62   2. Status post bilateral breast reduction  Z98.890     Ms. Elizabeth Crawford is doing well.  She is very happy with her outcome.  There is no sign of infection, seroma, hematoma.  Photos previously taken at her visit 1 month ago.  Avoid underwire bra.  Call with any questions or concerns, follow-up as needed.   Elizabeth Rhine Wana [place], PA-C 09/21/2019, 11:20 AM

## 2019-10-20 ENCOUNTER — Telehealth: Payer: Self-pay | Admitting: Plastic Surgery

## 2019-10-20 NOTE — Telephone Encounter (Signed)
Called patient's husband, per patient's request, regarding a bill that was received. I informed Mr. Russaw that I have spoken with [place] and was advised that an e-mail had been sent to billing regarding the procedure in question. I advised that if another bill is received to call the office and see if [place] can provide any further updates.

## 2020-01-28 ENCOUNTER — Other Ambulatory Visit: Payer: Self-pay

## 2020-01-28 DIAGNOSIS — R110 Nausea: Secondary | ICD-10-CM

## 2020-01-28 MED ORDER — ONDANSETRON 4 MG PO TBDP: 4.0000 mg | ORAL_TABLET | Freq: Four times a day (QID) | ORAL | 1 refills | Status: DC | PRN

## 2020-02-14 ENCOUNTER — Encounter: Payer: Self-pay | Admitting: Surgery

## 2020-02-14 ENCOUNTER — Other Ambulatory Visit: Payer: Self-pay

## 2020-02-14 ENCOUNTER — Ambulatory Visit (INDEPENDENT_AMBULATORY_CARE_PROVIDER_SITE_OTHER): Payer: Medicare Other | Admitting: Surgery

## 2020-02-14 ENCOUNTER — Telehealth: Payer: Self-pay | Admitting: Surgery

## 2020-02-14 VITALS — BP 109/77 | HR 79 | Temp 98.5°F | Ht 64.0 in | Wt 176.8 lb

## 2020-02-14 DIAGNOSIS — R112 Nausea with vomiting, unspecified: Secondary | ICD-10-CM

## 2020-02-14 DIAGNOSIS — R110 Nausea: Secondary | ICD-10-CM

## 2020-02-14 MED ORDER — AZITHROMYCIN 500 MG PO TABS: 500.0000 mg | ORAL_TABLET | Freq: Every day | ORAL | 0 refills | Status: AC

## 2020-02-14 MED ORDER — ERYTHROMYCIN BASE 250 MG PO TBEC: 250.0000 mg | DELAYED_RELEASE_TABLET | Freq: Three times a day (TID) | ORAL | 0 refills | Status: DC

## 2020-02-14 NOTE — Telephone Encounter (Signed)
Patient is calling and said her medication Reglan was 500 dollars at her pharmacy and is asking if something else could be called into her pharmacy. Please call patient and advise.

## 2020-02-14 NOTE — Telephone Encounter (Signed)
Spoke with Dr Dahlia Byes about the cost of prescription of Erythromycin that was sent over to patient's pharmacy at today's visit. Patient called stating the prescription was $500 when she arrived for pick up. Per Dr.Pabon will send over prescription for Azithromycin 500 mg daily for 21 days. Patient notified and made aware and was advised to give our office a call if she has any problems.

## 2020-02-14 NOTE — Patient Instructions (Addendum)
Dr.Pabon suggested patient try prescription Erythromycin twice a day 15 minutes before each meal for a month. Patient will take 15-20 minutes before breakfast, lunch and dinner. Patient will follow up with Dr.Pabon in 1 month.  Healthy Eating Following a healthy eating pattern may help you to achieve and maintain a healthy body weight, reduce the risk of chronic disease, and live a long and productive life. It is important to follow a healthy eating pattern at an appropriate calorie level for your body. Your nutritional needs should be met primarily through food by choosing a variety of nutrient-rich foods. What are tips for following this plan? Reading food labels  Read labels and choose the following: ? Reduced or low sodium. ? Juices with 100% fruit juice. ? Foods with low saturated fats and high polyunsaturated and monounsaturated fats. ? Foods with whole grains, such as whole wheat, cracked wheat, brown rice, and wild rice. ? Whole grains that are fortified with folic acid. This is recommended for women who are pregnant or who want to become pregnant.  Read labels and avoid the following: ? Foods with a lot of added sugars. These include foods that contain brown sugar, corn sweetener, corn syrup, dextrose, fructose, glucose, high-fructose corn syrup, honey, invert sugar, lactose, malt syrup, maltose, molasses, raw sugar, sucrose, trehalose, or turbinado sugar.  Do not eat more than the following amounts of added sugar per day:  6 teaspoons (25 g) for women.  9 teaspoons (38 g) for men. ? Foods that contain processed or refined starches and grains. ? Refined grain products, such as white flour, degermed cornmeal, white bread, and white rice. Shopping  Choose nutrient-rich snacks, such as vegetables, whole fruits, and nuts. Avoid high-calorie and high-sugar snacks, such as potato chips, fruit snacks, and candy.  Use oil-based dressings and spreads on foods instead of solid fats such  as butter, stick margarine, or cream cheese.  Limit pre-made sauces, mixes, and "instant" products such as flavored rice, instant noodles, and ready-made pasta.  Try more plant-protein sources, such as tofu, tempeh, black beans, edamame, lentils, nuts, and seeds.  Explore eating plans such as the Mediterranean diet or vegetarian diet. Cooking  Use oil to saut or stir-fry foods instead of solid fats such as butter, stick margarine, or lard.  Try baking, boiling, grilling, or broiling instead of frying.  Remove the fatty part of meats before cooking.  Steam vegetables in water or broth. Meal planning   At meals, imagine dividing your plate into fourths: ? One-half of your plate is fruits and vegetables. ? One-fourth of your plate is whole grains. ? One-fourth of your plate is protein, especially lean meats, poultry, eggs, tofu, beans, or nuts.  Include low-fat dairy as part of your daily diet. Lifestyle  Choose healthy options in all settings, including home, work, school, restaurants, or stores.  Prepare your food safely: ? Wash your hands after handling raw meats. ? Keep food preparation surfaces clean by regularly washing with hot, soapy water. ? Keep raw meats separate from ready-to-eat foods, such as fruits and vegetables. ? Cook seafood, meat, poultry, and eggs to the recommended internal temperature. ? Store foods at safe temperatures. In general:  Keep cold foods at 27F (4.4C) or below.  Keep hot foods at 127F (60C) or above.  Keep your freezer at [facility] (-17.8C) or below.  Foods are no longer safe to eat when they have been between the temperatures of 40-127F (4.4-60C) for more than 2 hours. What foods should I  eat? Fruits Aim to eat 2 cup-equivalents of fresh, canned (in natural juice), or frozen fruits each day. Examples of 1 cup-equivalent of fruit include 1 small apple, 8 large strawberries, 1 cup canned fruit,  cup dried fruit, or 1 cup 100%  juice. Vegetables Aim to eat 2-3 cup-equivalents of fresh and frozen vegetables each day, including different varieties and colors. Examples of 1 cup-equivalent of vegetables include 2 medium carrots, 2 cups raw, leafy greens, 1 cup chopped vegetable (raw or cooked), or 1 medium baked potato. Grains Aim to eat 6 ounce-equivalents of whole grains each day. Examples of 1 ounce-equivalent of grains include 1 slice of bread, 1 cup ready-to-eat cereal, 3 cups popcorn, or  cup cooked rice, pasta, or cereal. Meats and other proteins Aim to eat 5-6 ounce-equivalents of protein each day. Examples of 1 ounce-equivalent of protein include 1 egg, 1/2 cup nuts or seeds, or 1 tablespoon (16 g) peanut butter. A cut of meat or fish that is the size of a deck of cards is about 3-4 ounce-equivalents.  Of the protein you eat each week, try to have at least 8 ounces come from seafood. This includes salmon, trout, herring, and anchovies. Dairy Aim to eat 3 cup-equivalents of fat-free or low-fat dairy each day. Examples of 1 cup-equivalent of dairy include 1 cup (240 mL) milk, 8 ounces (250 g) yogurt, 1 ounces (44 g) natural cheese, or 1 cup (240 mL) fortified soy milk. Fats and oils  Aim for about 5 teaspoons (21 g) per day. Choose monounsaturated fats, such as canola and olive oils, avocados, peanut butter, and most nuts, or polyunsaturated fats, such as sunflower, corn, and soybean oils, walnuts, pine nuts, sesame seeds, sunflower seeds, and flaxseed. Beverages  Aim for six 8-oz glasses of water per day. Limit coffee to three to five 8-oz cups per day.  Limit caffeinated beverages that have added calories, such as soda and energy drinks.  Limit alcohol intake to no more than 1 drink a day for nonpregnant women and 2 drinks a day for men. One drink equals 12 oz of beer (355 mL), 5 oz of wine (148 mL), or 1 oz of hard liquor (44 mL). Seasoning and other foods  Avoid adding excess amounts of salt to your foods.  Try flavoring foods with herbs and spices instead of salt.  Avoid adding sugar to foods.  Try using oil-based dressings, sauces, and spreads instead of solid fats. This information is based on general [place] nutrition guidelines. For more information, visit choosemyplate.gov. Exact amounts may vary based on your nutrition needs. Summary  A healthy eating plan may help you to maintain a healthy weight, reduce the risk of chronic diseases, and stay active throughout your life.  Plan your meals. Make sure you eat the right portions of a variety of nutrient-rich foods.  Try baking, boiling, grilling, or broiling instead of frying.  Choose healthy options in all settings, including home, work, school, restaurants, or stores. This information is not intended to replace advice given to you by your health care provider. Make sure you discuss any questions you have with your health care provider. Document Revised: 06/23/2017 Document Reviewed: 06/23/2017 Elsevier Patient Education  2020 [employer]  

## 2020-02-15 NOTE — Progress Notes (Signed)
Outpatient Surgical Follow Up  02/15/2020  Elizabeth Crawford is an 74 y.o. female.   Chief Complaint  Patient presents with  . Follow-up    last seen 2021 pt still have nausea/vomiting- has hernia sx 2020    HPI: Elizabeth Crawford is a 74 year old female well-known to me with a prior history of robotic repair of hiatal hernia with fundoplication on August 7001.  She comes in with intermittent nausea and vomiting.  She reports that she vomits every few weeks.  She reports she feels nauseated.  No fevers no chills.  I have personally reviewed once again the images ( CT and Swallow) and compared the images to preoperatively.  There is definitely an intact wrap and an intact repair.  There is significant improvement of the paraesophageal hernia after the surgery when compared to preoperative imaging.  Most importantly clinically she feels better.  She states that her cough on her dyspnea that were the reason for repair  ( due to recurrent  Microaspiration and reflux) have subsided. He is overall very pleased with her outcome.  She is not having any dysphagia.  Past Medical History:  Diagnosis Date  . Allergy   . Arthritis   . Complication of anesthesia   . DVT (deep venous thrombosis) ([place])   . GERD (gastroesophageal reflux disease)   . H/O seasonal allergies   . PONV (postoperative nausea and vomiting)    Gets sick every time  . Pulmonary embolism ([place])   . Sleep apnea   . Spondylolisthesis     Past Surgical History:  Procedure Laterality Date  . BACK SURGERY     x2  . BLADDER SUSPENSION  2009  . BREAST REDUCTION SURGERY Bilateral 03/01/2019   Procedure: BILATERAL BREAST REDUCTION;  Surgeon: Wallace Going, DO;  Location: [facility];  Service: Plastics;  Laterality: Bilateral;  total case 4 hours  . CARPAL TUNNEL RELEASE  2003   Left hand  . CHOLECYSTECTOMY  2000   Laparoscopic  . COLONOSCOPY W/ POLYPECTOMY  2014  . COLONOSCOPY WITH PROPOFOL N/A 05/22/2017   Procedure: COLONOSCOPY WITH  PROPOFOL;  Surgeon: Lollie Sails, MD;  Location[facility] ENDOSCOPY;  Service: Endoscopy;  Laterality: N/A;  . DECOMPRESSIVE LUMBAR LAMINECTOMY LEVEL 2 N/A 11/09/2015   Procedure: DECOMPRESSION L2-5 REMOVAL PEDICULE SCREWS L4-5;  Surgeon: Melina Schools, MD;  Location: [place];  Service: Orthopedics;  Laterality: N/A;  . ESOPHAGOGASTRODUODENOSCOPY (EGD) WITH PROPOFOL N/A 10/09/2018   Procedure: ESOPHAGOGASTRODUODENOSCOPY (EGD) WITH PROPOFOL;  Surgeon: Lollie Sails, MD;  Location: [facility] ENDOSCOPY;  Service: Endoscopy;  Laterality: N/A;  . EYE SURGERY Bilateral 2005   cataract surgery   . HARDWARE REMOVAL N/A 11/09/2015   Procedure: HARDWARE REMOVAL;  Surgeon: Melina Schools, MD;  Location: [place];  Service: Orthopedics;  Laterality: N/A;  . [place]  . IVC filter placement  02-01-10  . REDUCTION MAMMAPLASTY Bilateral 03/01/2019  . SPINE SURGERY  5 23-12   anterolateral fusion/ rodding  . TONSILLECTOMY    . TUBAL LIGATION  1977    Family History  Problem Relation Age of Onset  . COPD Mother   . Heart disease Mother   . Aortic aneurysm Mother   . Heart disease Father     Social History:  reports that she quit smoking about 44 years ago. Her smoking use included cigarettes. She quit after 15.00 years of use. She has never used smokeless tobacco. She reports current alcohol use. She reports that she does not use drugs.  Allergies:  Allergies  Allergen Reactions  . Lidocaine Other (See Comments)    UTI (polyuria, dysuria) and facial flushing  . Tape     Paper and adhesive tape make blisters and redness and difficult to remove even on the first day  . Methylprednisolone Palpitations  . Sulfasalazine Nausea Only  . Sulfur Nausea And Vomiting    Medications reviewed.    ROS Full ROS performed and is otherwise negative other than what is stated in HPI   BP 109/77   Pulse 79   Temp 98.5 F (36.9 C) (Oral)   Ht 5\' 4"  (1.626 m)   Wt 176 lb 12.8 oz (80.2 kg)    SpO2 96%   BMI 30.35 kg/m   Physical Exam Vitals and nursing note reviewed. Exam conducted with a chaperone present.  Constitutional:      General: She is not in acute distress.    Appearance: Normal appearance.  Cardiovascular:     Rate and Rhythm: Normal rate and regular rhythm.  Pulmonary:     Effort: Pulmonary effort is normal. No respiratory distress.     Breath sounds: Normal breath sounds. No stridor. No rhonchi.  Abdominal:     General: Abdomen is flat. There is no distension.     Palpations: Abdomen is soft. There is no mass.     Tenderness: There is no abdominal tenderness. There is no guarding or rebound.     Hernia: No hernia is present.     Comments: Incisions are clean dry and intact without infection.  Musculoskeletal:     Cervical back: Normal range of motion and neck supple. No rigidity or tenderness.  Lymphadenopathy:     Cervical: No cervical adenopathy.  Skin:    General: Skin is warm and dry.     Capillary Refill: Capillary refill takes less than 2 seconds.  Neurological:     General: No focal deficit present.     Mental Status: She is alert and oriented to person, place, and time.  Psychiatric:        Mood and Affect: Mood normal.        Behavior: Behavior normal.        Thought Content: Thought content normal.        Judgment: Judgment normal.     Assessment/Plan: Remittent nausea and occasional vomiting after robotic repair of hiatal hernia and fundoplication.  There is no evidence of hiatal hernia recurrences and there is an intact wrap.  Do the suspect that he does have a degree of gastroparesis given the fact that she had pretty large paraesophageal hernia for several decades.  I would recommend to continue Zofran and start erythromycin for gastroparesis.  Apparently she did not do well with Reglan.  If this intervention does not work we may have to do further work-up and may have to refer to GI.  Extensive counseling provided.  There is no need for  surgical intervention at this time.  I will follow her up in about a month or so   Greater than 50% of the 40 minutes  visit was spent in counseling/coordination of care   Caroleen Hamman, MD [place] Surgeon

## 2020-03-08 ENCOUNTER — Ambulatory Visit (INDEPENDENT_AMBULATORY_CARE_PROVIDER_SITE_OTHER): Payer: Medicare Other | Admitting: Surgery

## 2020-03-08 ENCOUNTER — Other Ambulatory Visit: Payer: Self-pay

## 2020-03-08 ENCOUNTER — Encounter: Payer: Self-pay | Admitting: Surgery

## 2020-03-08 VITALS — BP 116/73 | HR 62 | Temp 98.3°F | Ht 64.0 in | Wt 172.8 lb

## 2020-03-08 DIAGNOSIS — R112 Nausea with vomiting, unspecified: Secondary | ICD-10-CM | POA: Diagnosis not present

## 2020-03-08 NOTE — Patient Instructions (Addendum)
Referral to [facility] with Dr.Anna. Their facility will contact you with scheduling your appointment.  Dr Dahlia Byes discussed with patient he recommends a repeat Barium Swallow. Patient is scheduled for Barium Swallow at [facility] 03/10/20 arrive at 11:15a with procedure at 11:30a. Patient is NOT to have anything to eat or drink 4 hours prior to procedure.   Nausea and Vomiting, Adult Nausea is feeling sick to your stomach or feeling that you are about to throw up (vomit). Vomiting is when food in your stomach is thrown up and out of the mouth. Throwing up can make you feel weak. It can also make you lose too much water in your body (get dehydrated). If you lose too much water in your body, you may:  Feel tired.  Feel thirsty.  Have a dry mouth.  Have cracked lips.  Go pee (urinate) less often. Older adults and people with other diseases or a weak body defense system (immune system) are at higher risk for losing too much water in the body. If you feel sick to your stomach and you throw up, it is important to follow instructions from your doctor about how to take care of yourself. Follow these instructions at home: Watch your symptoms for any changes. Tell your doctor about them. Follow these instructions to care for yourself at home. Eating and drinking      Take an ORS (oral rehydration solution). This is a drink that is sold at pharmacies and stores.  Drink clear fluids in small amounts as you are able, such as: ? Water. ? Ice chips. ? Fruit juice that has water added (diluted fruit juice). ? Low-calorie sports drinks.  Eat bland, easy-to-digest foods in small amounts as you are able, such as: ? Bananas. ? Applesauce. ? Rice. ? Low-fat (lean) meats. ? Toast. ? Crackers.  Avoid drinking fluids that have a lot of sugar or caffeine in them. This includes energy drinks, sports drinks, and soda.  Avoid alcohol.  Avoid spicy or fatty foods. General  instructions  Take over-the-counter and prescription medicines only as told by your doctor.  Drink enough fluid to keep your pee (urine) pale yellow.  Wash your hands often with soap and water. If you cannot use soap and water, use hand sanitizer.  Make sure that all people in your home wash their hands well and often.  Rest at home while you get better.  Watch your condition for any changes.  Take slow and deep breaths when you feel sick to your stomach.  Keep all follow-up visits as told by your doctor. This is important. Contact a doctor if:  Your symptoms get worse.  You have new symptoms.  You have a fever.  You cannot drink fluids without throwing up.  You feel sick to your stomach for more than 2 days.  You feel light-headed or dizzy.  You have a headache.  You have muscle cramps.  You have a rash.  You have pain while peeing. Get help right away if:  You have pain in your chest, neck, arm, or jaw.  You feel very weak or you pass out (faint).  You throw up again and again.  You have throw up that is bright red or looks like black coffee grounds.  You have bloody or black poop (stools) or poop that looks like tar.  You have a very bad headache, a stiff neck, or both.  You have very bad pain, cramping, or bloating in your belly (abdomen).  You have trouble breathing.  You are breathing very quickly.  Your heart is beating very quickly.  Your skin feels cold and clammy.  You feel confused.  You have signs of losing too much water in your body, such as: ? Dark pee, very little pee, or no pee. ? Cracked lips. ? Dry mouth. ? Sunken eyes. ? Sleepiness. ? Weakness. These symptoms may be an emergency. Do not wait to see if the symptoms will go away. Get medical help right away. Call your local emergency services (911 in the [place] Do not drive yourself to the hospital. Summary  Nausea is feeling sick to your stomach or feeling that you are about  to throw up (vomit). Vomiting is when food in your stomach is thrown up and out of the mouth.  Follow instructions from your doctor about eating and drinking to keep from losing too much water in your body.  Take over-the-counter and prescription medicines only as told by your doctor.  Contact your doctor if your symptoms get worse or you have new symptoms.  Keep all follow-up visits as told by your doctor. This is important. This information is not intended to replace advice given to you by your health care provider. Make sure you discuss any questions you have with your health care provider. Document Revised: 07/03/2018 Document Reviewed: 08/19/2017 Elsevier Patient Education  [place].

## 2020-03-10 ENCOUNTER — Telehealth: Payer: Self-pay

## 2020-03-10 ENCOUNTER — Ambulatory Visit
Admission: RE | Admit: 2020-03-10 | Discharge: 2020-03-10 | Disposition: A | Discharge status: Home / Self Care | Payer: Medicare Other | Source: Ambulatory Visit | Attending: Surgery | Admitting: Surgery

## 2020-03-10 ENCOUNTER — Other Ambulatory Visit: Payer: Self-pay

## 2020-03-10 DIAGNOSIS — R112 Nausea with vomiting, unspecified: Secondary | ICD-10-CM | POA: Diagnosis not present

## 2020-03-10 NOTE — Telephone Encounter (Signed)
rEceived crital report from [facility] at [facility] radiology on this patient -spoke with Dr.Pabon and was instructed to schedule patient this coming Wednesday over book if necessary.

## 2020-03-13 ENCOUNTER — Telehealth: Payer: Self-pay

## 2020-03-13 DIAGNOSIS — R110 Nausea: Secondary | ICD-10-CM

## 2020-03-13 DIAGNOSIS — K449 Diaphragmatic hernia without obstruction or gangrene: Secondary | ICD-10-CM

## 2020-03-13 NOTE — Progress Notes (Signed)
Outpatient Surgical Follow Up  03/13/2020  Elizabeth Crawford is an 74 y.o. female.   Chief Complaint  Patient presents with  . Follow-up    Follow up - nausea and vomiting follow up - Dr Dahlia Byes    HPI: Elizabeth Crawford is status post large  paraesophageal hernia repair Nissen fundoplication close to a year and a half ago.  She developed delayed nausea and also dysphagia and regurgitation.  She does not have any reflux.  Her pulmonary symptoms have completely resolved.  No fevers no chills she is otherwise doing well  Past Medical History:  Diagnosis Date  . Allergy   . Arthritis   . Complication of anesthesia   . DVT (deep venous thrombosis) ([place])   . GERD (gastroesophageal reflux disease)   . H/O seasonal allergies   . PONV (postoperative nausea and vomiting)    Gets sick every time  . Pulmonary embolism ([place])   . Sleep apnea   . Spondylolisthesis     Past Surgical History:  Procedure Laterality Date  . BACK SURGERY     x2  . BLADDER SUSPENSION  2009  . BREAST REDUCTION SURGERY Bilateral 03/01/2019   Procedure: BILATERAL BREAST REDUCTION;  Surgeon: Wallace Going, DO;  Location: [place];  Service: Plastics;  Laterality: Bilateral;  total case 4 hours  . CARPAL TUNNEL RELEASE  2003   Left hand  . CHOLECYSTECTOMY  2000   Laparoscopic  . COLONOSCOPY W/ POLYPECTOMY  2014  . COLONOSCOPY WITH PROPOFOL N/A 05/22/2017   Procedure: COLONOSCOPY WITH PROPOFOL;  Surgeon: Lollie Sails, MD;  Location: [facility] ENDOSCOPY;  Service: Endoscopy;  Laterality: N/A;  . DECOMPRESSIVE LUMBAR LAMINECTOMY LEVEL 2 N/A 11/09/2015   Procedure: DECOMPRESSION L2-5 REMOVAL PEDICULE SCREWS L4-5;  Surgeon: Melina Schools, MD;  Location: [place];  Service: Orthopedics;  Laterality: N/A;  . ESOPHAGOGASTRODUODENOSCOPY (EGD) WITH PROPOFOL N/A 10/09/2018   Procedure: ESOPHAGOGASTRODUODENOSCOPY (EGD) WITH PROPOFOL;  Surgeon: Lollie Sails, MD;  Location: [facility] ENDOSCOPY;  Service: Endoscopy;  Laterality: N/A;  . EYE  SURGERY Bilateral 2005   cataract surgery   . HARDWARE REMOVAL N/A 11/09/2015   Procedure: HARDWARE REMOVAL;  Surgeon: Melina Schools, MD;  Location: [place];  Service: Orthopedics;  Laterality: N/A;  . [place]  . IVC filter placement  02-01-10  . REDUCTION MAMMAPLASTY Bilateral 03/01/2019  . SPINE SURGERY  5 23-12   anterolateral fusion/ rodding  . TONSILLECTOMY    . TUBAL LIGATION  1977    Family History  Problem Relation Age of Onset  . COPD Mother   . Heart disease Mother   . Aortic aneurysm Mother   . Heart disease Father     Social History:  reports that she quit smoking about 44 years ago. Her smoking use included cigarettes. She quit after 15.00 years of use. She has never used smokeless tobacco. She reports current alcohol use. She reports that she does not use drugs.  Allergies:  Allergies  Allergen Reactions  . Lidocaine Other (See Comments)    UTI (polyuria, dysuria) and facial flushing  . Tape     Paper and adhesive tape make blisters and redness and difficult to remove even on the first day  . Methylprednisolone Palpitations  . Sulfasalazine Nausea Only  . Sulfur Nausea And Vomiting    Medications reviewed.    ROS Full ROS performed and is otherwise negative other than what is stated in HPI   BP 116/73   Pulse 62  Temp 98.3 F (36.8 C) (Oral)   Ht 5\' 4"  (1.626 m)   Wt 172 lb 12.8 oz (78.4 kg)   SpO2 95%   BMI 29.66 kg/m   Physical Exam Vitals and nursing note reviewed. Exam conducted with a chaperone present.  Constitutional:      General: She is not in acute distress.    Appearance: Normal appearance. She is normal weight.  Pulmonary:     Effort: Pulmonary effort is normal.     Breath sounds: Normal breath sounds. No stridor.  Abdominal:     General: Abdomen is flat. There is no distension.     Palpations: Abdomen is soft. There is no mass.     Tenderness: There is no abdominal tenderness. There is no guarding or rebound.      Hernia: No hernia is present.  Musculoskeletal:        General: No swelling. Normal range of motion.  Skin:    General: Skin is warm and dry.     Capillary Refill: Capillary refill takes less than 2 seconds.  Neurological:     General: No focal deficit present.     Mental Status: She is alert and oriented to person, place, and time.  Psychiatric:        Mood and Affect: Mood normal.        Behavior: Behavior normal.        Thought Content: Thought content normal.        Judgment: Judgment normal.        Assessment/Plan: Late onsent nausea and regurgitation paraesophageal hernia.  Discussed with patient detail we will start work-up again with an EGD and barium swallow.  Likely will need a CT scan of the abdomen pelvis to evaluate if she is got a recurrence.  She is not toxic she does not need hospitalization or immediate surgical intervention at this time    Greater than 50% of the 25 minutes  visit was spent in counseling/coordination of care   Caroleen Hamman, MD [place] Surgeon

## 2020-03-13 NOTE — Telephone Encounter (Signed)
CT chest/abdomen scheduled 03/31/19 @ 1:15 pm @ Outpatient imaging. NPO 4 hours prior-please pick up prep kit and instructions 2 days prior to having scan done. Please keep appointment with Dr.Pabon 04/04/19. Patient notified.

## 2020-03-15 ENCOUNTER — Ambulatory Visit: Payer: Medicare Other | Admitting: Surgery

## 2020-03-15 ENCOUNTER — Encounter: Payer: Self-pay | Admitting: Surgery

## 2020-03-15 ENCOUNTER — Other Ambulatory Visit: Payer: Self-pay

## 2020-03-15 ENCOUNTER — Ambulatory Visit (INDEPENDENT_AMBULATORY_CARE_PROVIDER_SITE_OTHER): Payer: Medicare Other | Admitting: Surgery

## 2020-03-15 VITALS — BP 136/83 | HR 76 | Temp 98.1°F | Ht 64.0 in | Wt 172.2 lb

## 2020-03-15 DIAGNOSIS — K9189 Other postprocedural complications and disorders of digestive system: Secondary | ICD-10-CM | POA: Diagnosis not present

## 2020-03-15 NOTE — Patient Instructions (Signed)
Please see your appointments listed below. 

## 2020-03-17 NOTE — Progress Notes (Signed)
Outpatient Surgical Follow Up  03/17/2020  MAILE LINFORD is an 74 y.o. female.   Chief Complaint  Patient presents with  . Follow-up    Discuss test results     HPI: 76-year-old female well-known to me with a history of paraesophageal hernia repair 10/2018.  Did very well initially and then last couple months continue to have regurgitation.  Had a prior work-up to include barium swallow that at that time the wrap was intact.  We repeated that study last week and this showed evidence of a slipped fundoplication and likely recurred paraesophageal hernia.  She continues to have some regurgitation and reflux.  She now started to have cough.  No fevers no chills she is able to swallow.  He is otherwise doing well and her pulmonary status still good.  Have shared pictures with them and show them the barium swallow.  She is accompanied by her husband  Past Medical History:  Diagnosis Date  . Allergy   . Arthritis   . Complication of anesthesia   . DVT (deep venous thrombosis) ([place])   . GERD (gastroesophageal reflux disease)   . H/O seasonal allergies   . PONV (postoperative nausea and vomiting)    Gets sick every time  . Pulmonary embolism ([place])   . Sleep apnea   . Spondylolisthesis     Past Surgical History:  Procedure Laterality Date  . BACK SURGERY     x2  . BLADDER SUSPENSION  2009  . BREAST REDUCTION SURGERY Bilateral 03/01/2019   Procedure: BILATERAL BREAST REDUCTION;  Surgeon: Wallace Going, DO;  Location: [place];  Service: Plastics;  Laterality: Bilateral;  total case 4 hours  . CARPAL TUNNEL RELEASE  2003   Left hand  . CHOLECYSTECTOMY  2000   Laparoscopic  . COLONOSCOPY W/ POLYPECTOMY  2014  . COLONOSCOPY WITH PROPOFOL N/A 05/22/2017   Procedure: COLONOSCOPY WITH PROPOFOL;  Surgeon: Lollie Sails, MD;  Location: [facility] ENDOSCOPY;  Service: Endoscopy;  Laterality: N/A;  . DECOMPRESSIVE LUMBAR LAMINECTOMY LEVEL 2 N/A 11/09/2015   Procedure: DECOMPRESSION L2-5  REMOVAL PEDICULE SCREWS L4-5;  Surgeon: Melina Schools, MD;  Location: [place];  Service: Orthopedics;  Laterality: N/A;  . ESOPHAGOGASTRODUODENOSCOPY (EGD) WITH PROPOFOL N/A 10/09/2018   Procedure: ESOPHAGOGASTRODUODENOSCOPY (EGD) WITH PROPOFOL;  Surgeon: Lollie Sails, MD;  Location: [facility] ENDOSCOPY;  Service: Endoscopy;  Laterality: N/A;  . EYE SURGERY Bilateral 2005   cataract surgery   . HARDWARE REMOVAL N/A 11/09/2015   Procedure: HARDWARE REMOVAL;  Surgeon: Melina Schools, MD;  Location: [place];  Service: Orthopedics;  Laterality: N/A;  . [place]  . IVC filter placement  02-01-10  . REDUCTION MAMMAPLASTY Bilateral 03/01/2019  . SPINE SURGERY  5 23-12   anterolateral fusion/ rodding  . TONSILLECTOMY    . TUBAL LIGATION  1977    Family History  Problem Relation Age of Onset  . COPD Mother   . Heart disease Mother   . Aortic aneurysm Mother   . Heart disease Father     Social History:  reports that she quit smoking about 44 years ago. Her smoking use included cigarettes. She quit after 15.00 years of use. She has never used smokeless tobacco. She reports current alcohol use. She reports that she does not use drugs.  Allergies:  Allergies  Allergen Reactions  . Lidocaine Other (See Comments)    UTI (polyuria, dysuria) and facial flushing  . Tape     Paper and adhesive tape  make blisters and redness and difficult to remove even on the first day  . Methylprednisolone Palpitations  . Sulfasalazine Nausea Only  . Sulfur Nausea And Vomiting    Medications reviewed.    ROS Full ROS performed and is otherwise negative other than what is stated in HPI   BP 136/83   Pulse 76   Temp 98.1 F (36.7 C) (Oral)   Ht 5\' 4"  (1.626 m)   Wt 172 lb 3.2 oz (78.1 kg)   SpO2 98%   BMI 29.56 kg/m   Physical Exam     No results found for this or any previous visit (from the past 48 hour(s)). No results found.  Assessment/Plan: 74 year old female status post  paraesophageal hernia repair now with a slipped Nissen and likely recurrent hernia.  She is symptomatic and is having some regurgitation.  Discussed with patient in detail about the need for further work-up to include CT scan of the abdomen and upper endoscopy.  She will need revision of the Nissen and revision of the paraesophageal hernia repair.  I think that this time will have to add mesh likely pexy the fundoplication to the diaphragm to prevent any future recurrences.  She is currently nontoxic and does not need to be hospitalized.  Extensive discussion with the patient and the family.  All questions were answered  Greater than 50% of the 45 minutes  visit was spent in counseling/coordination of care   Caroleen Hamman, MD [place] Surgeon

## 2020-03-21 IMAGING — RF DG ESOPHAGUS
11 of 14 series · 14 of 24 positions shown · non-contrast
Comparison: CT scan January 28, 2019

CLINICAL DATA: Nausea and vomiting since hiatal hernia repair in
Wednesday October, 2018.

EXAM:
ESOPHOGRAM / BARIUM SWALLOW / BARIUM TABLET STUDY
TECHNIQUE: Combined double contrast and single contrast examination performed
using effervescent crystals, thick barium liquid, and thin barium
liquid. The patient was observed with fluoroscopy swallowing a 13 mm
barium sulphate tablet.
FLUOROSCOPY TIME:  Fluoroscopy Time:  2 minutes and 36 seconds
Number of Acquired Spot Images: 0

[Series 1: cp_standard · 0.26mm/px · 1 of 29 frames shown (1 of 11)]
[frame 5/29]
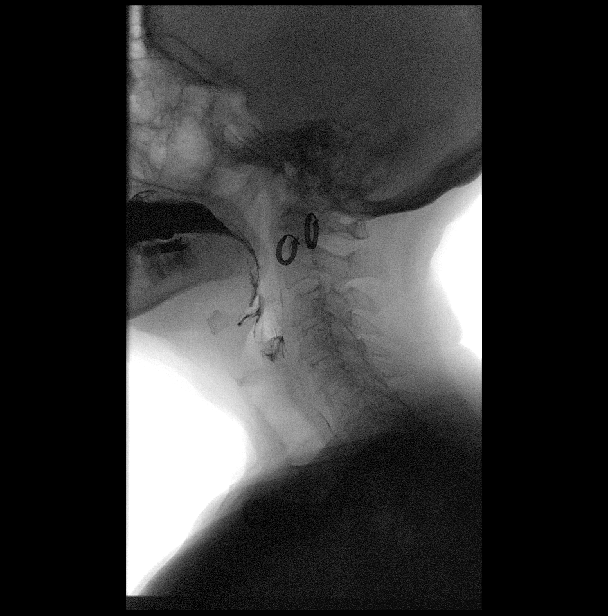

[Series 2: cp_standard · 0.26mm/px · 1 of 27 frames shown (2 of 11)]
[frame 5/27]
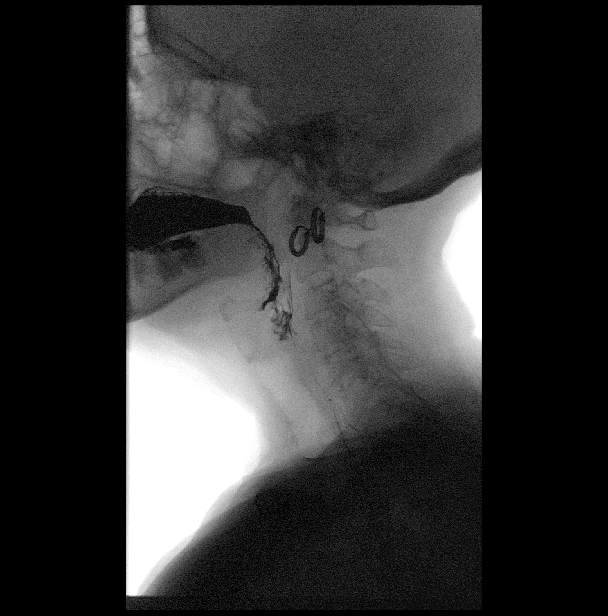

[Series 3: cp_standard · 0.26mm/px · 1 of 25 frames shown (3 of 11)]
[frame 13/25]
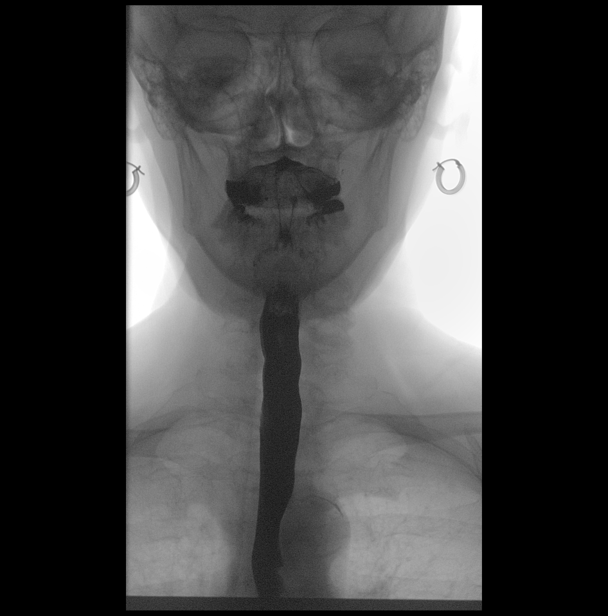

[Series 4: cp_standard · 0.26mm/px · 2 of 77 frames shown (4 of 11)]
[frame 39/77]
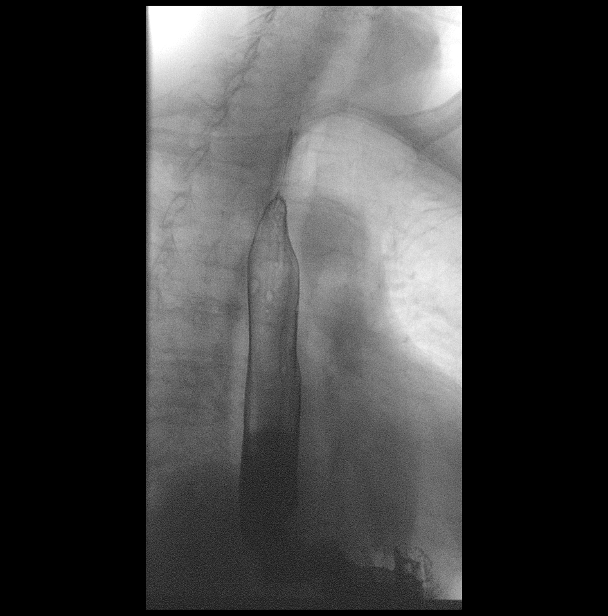
[frame 66/77]
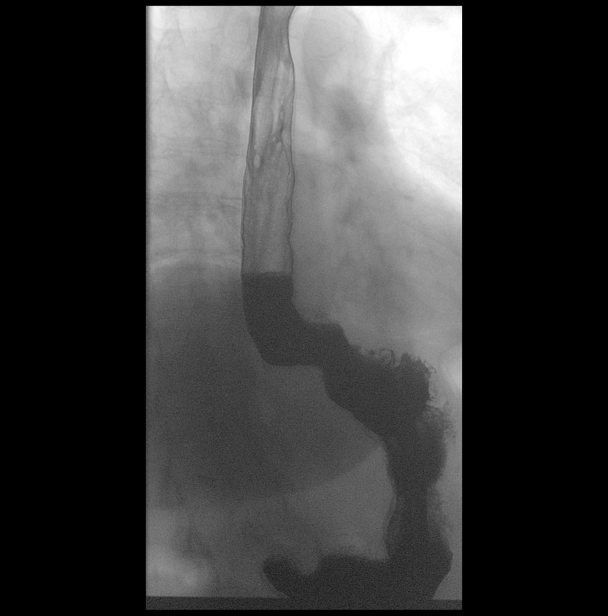

[Series 6: cp_standard · 0.26mm/px · 1 of 76 frames shown (5 of 11)]
[frame 12/76]
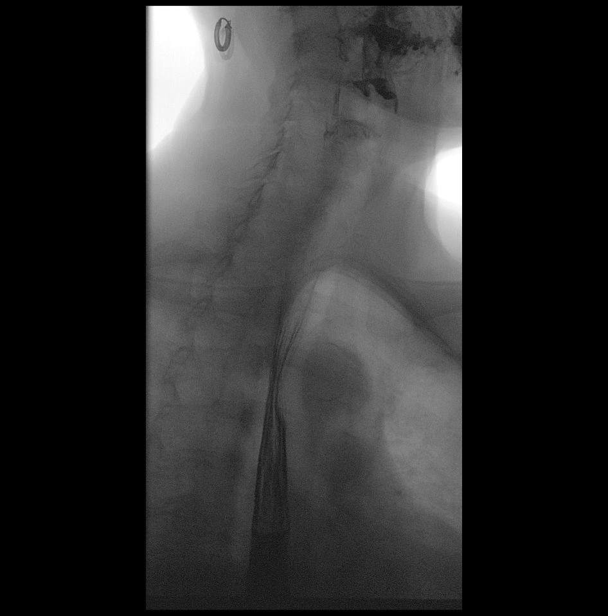

[Series 7: cp_standard · 0.26mm/px · 2 of 33 frames shown (6 of 11)]
[frame 5/33]
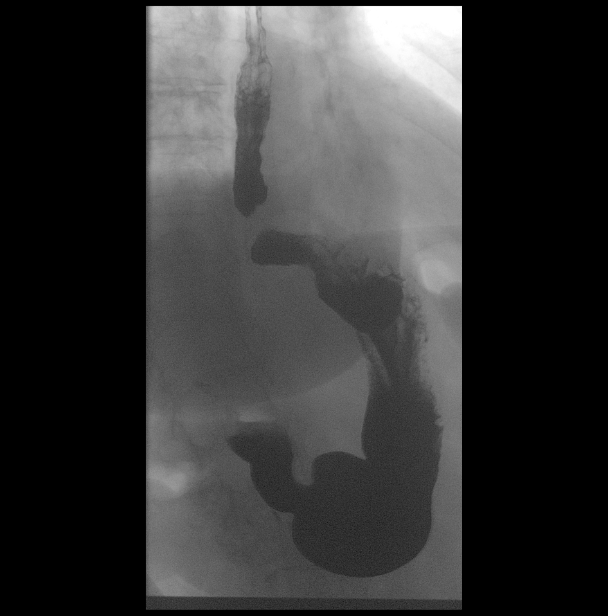
[frame 21/33]
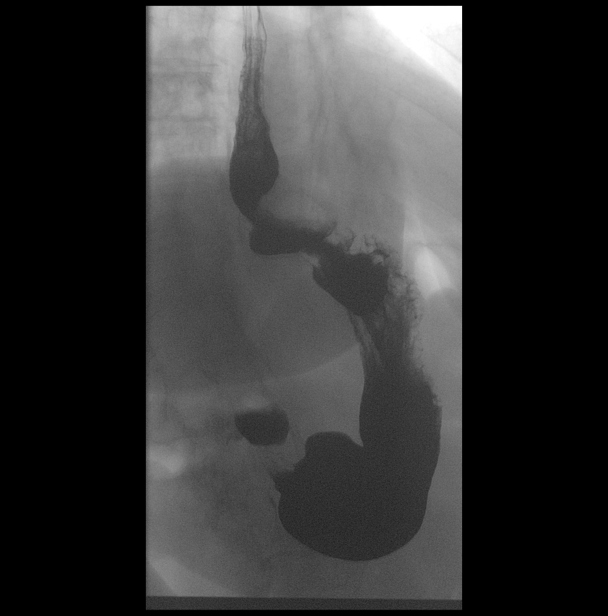

[Series 9: cp_standard · 0.26mm/px · 1 of 22 frames shown (7 of 11)]
[frame 4/22]
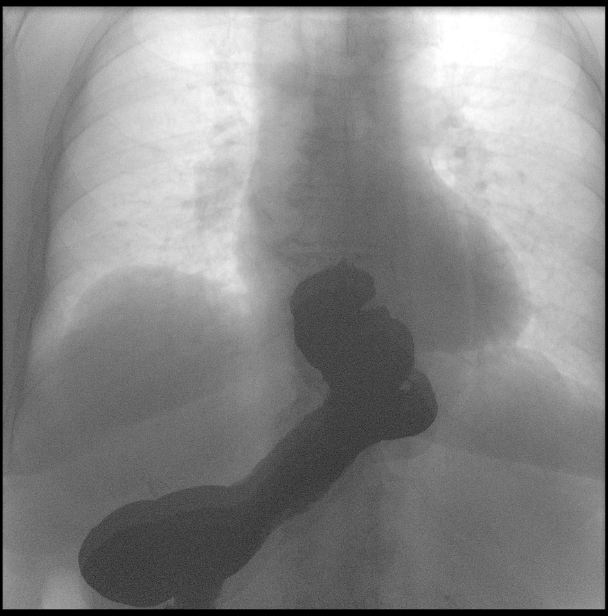

[Series 11: cp_standard · 0.26mm/px · 1 of 85 frames shown (8 of 11)]
[frame 13/85]
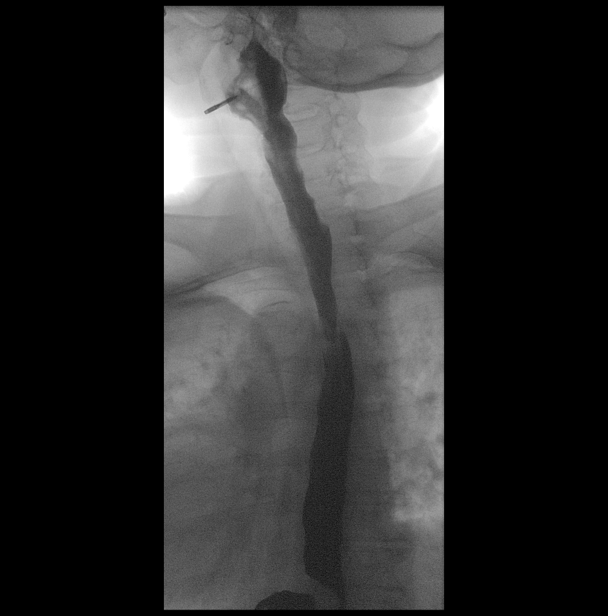

[Series 12: cp_standard · 0.26mm/px · 2 of 110 frames shown (9 of 11)]
[frame 56/110]
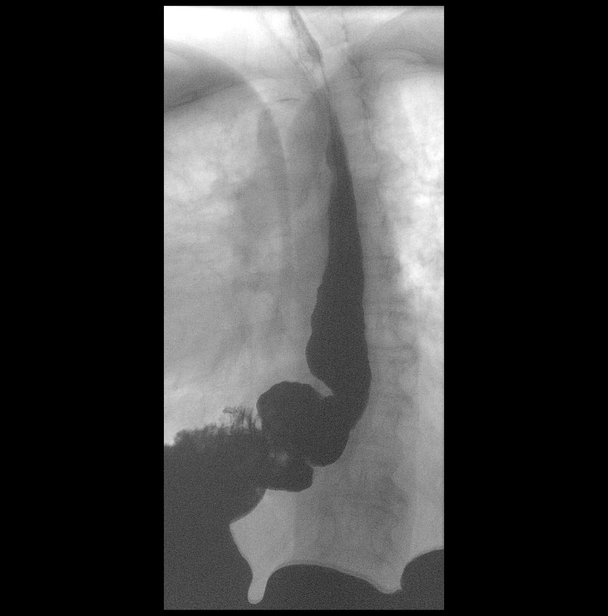
[frame 110/110]
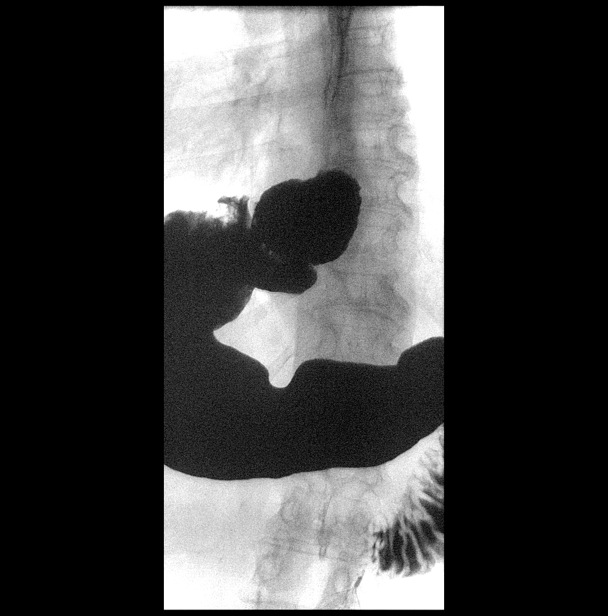

[Series 14: cp_standard · 0.26mm/px · 1 of 51 frames shown (10 of 11)]
[frame 44/51]
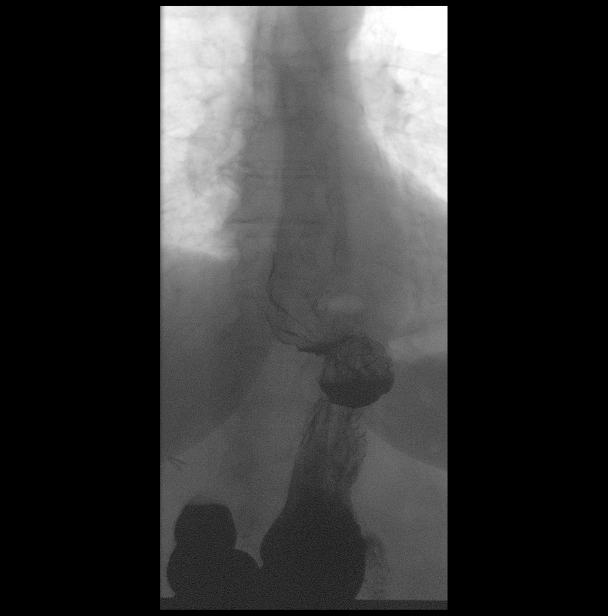

[Series 15: cp_standard · 0.26mm/px · 1 of 88 frames shown (11 of 11)]
[frame 88/88]
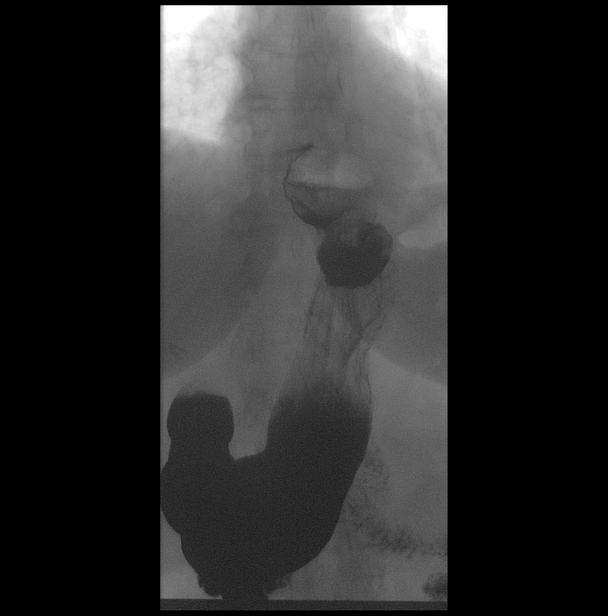

[14 of 24 positions shown; findings below may reference images not displayed]

FINDINGS: The pharynx is normal.

No mass or stricture identified in the esophagus. Normal
peristalsis. Significant reflux seen throughout the study. When the
patient was placed in the supine position, there is significant
reflux of barium from the stomach into the esophagus. The patient is
status post Nissen fundoplication. The portion of stomach wrapped
around the distal esophagus contains contrast and air throughout the
study. A small remaining hiatal hernia is not excluded. The
esophagus is mildly prominent caliber just proximal to the Nissen
fundoplication.

The barium tablet passes normally.

Limited views of the stomach are unremarkable.
IMPRESSION: 1. The patient is status post Nissen fundoplication. The [place]
appears to remain intact. The esophagus just proximal to the [place]
is mildly prominent but similar since the January 2019 CT scan.
There is no significant narrowing as contrast passes the region of
the [place] easily as does the barium tablet at the end of the study.
2. Significant reflux throughout the study.
3. A very small hiatal hernia may be present.

## 2020-03-30 ENCOUNTER — Ambulatory Visit: Admission: RE | Admit: 2020-03-30 | Payer: Medicare HMO | Source: Ambulatory Visit

## 2020-04-03 ENCOUNTER — Ambulatory Visit: Payer: Medicare Other | Admitting: Surgery

## 2020-04-13 ENCOUNTER — Telehealth: Payer: Self-pay

## 2020-04-13 ENCOUNTER — Ambulatory Visit
Admission: RE | Admit: 2020-04-13 | Discharge: 2020-04-13 | Disposition: A | Discharge status: Home / Self Care | Payer: Medicare HMO | Source: Ambulatory Visit | Attending: Surgery | Admitting: Surgery

## 2020-04-13 ENCOUNTER — Other Ambulatory Visit: Payer: Self-pay

## 2020-04-13 DIAGNOSIS — M5136 Other intervertebral disc degeneration, lumbar region: Secondary | ICD-10-CM | POA: Diagnosis not present

## 2020-04-13 DIAGNOSIS — M47814 Spondylosis without myelopathy or radiculopathy, thoracic region: Secondary | ICD-10-CM | POA: Diagnosis not present

## 2020-04-13 DIAGNOSIS — K449 Diaphragmatic hernia without obstruction or gangrene: Secondary | ICD-10-CM | POA: Insufficient documentation

## 2020-04-13 DIAGNOSIS — I2510 Atherosclerotic heart disease of native coronary artery without angina pectoris: Secondary | ICD-10-CM | POA: Diagnosis not present

## 2020-04-13 DIAGNOSIS — J8410 Pulmonary fibrosis, unspecified: Secondary | ICD-10-CM | POA: Diagnosis not present

## 2020-04-13 DIAGNOSIS — I700 Atherosclerosis of aorta: Secondary | ICD-10-CM | POA: Diagnosis not present

## 2020-04-13 LAB — POCT I-STAT CREATININE: Creatinine, Ser: 0.9 mg/dL (ref 0.44–1.00)

## 2020-04-13 MED ORDER — IOHEXOL 300 MG/ML  SOLN
100.0000 mL | Freq: Once | INTRAMUSCULAR | Status: AC | PRN
  Administered 2020-04-13: 100 mL via INTRAVENOUS

## 2020-04-13 NOTE — Telephone Encounter (Signed)
-----   Message from Jules Husbands, MD sent at 04/13/2020  2:08 PM EST ----- Please let her know her CT shows recurrent known hernia but no other surprises, keep appt w me ----- Message ----- From: Interface, Rad Results In Sent: 04/13/2020  10:13 AM EST To: Jules Husbands, MD

## 2020-04-13 NOTE — Telephone Encounter (Signed)
Spoke with patient and notified her of recent CT results via Dr.Pabon. Patient verbalized understanding and has no further questions.

## 2020-04-17 ENCOUNTER — Encounter: Payer: Self-pay | Admitting: Surgery

## 2020-04-17 ENCOUNTER — Other Ambulatory Visit: Payer: Self-pay

## 2020-04-17 ENCOUNTER — Ambulatory Visit (INDEPENDENT_AMBULATORY_CARE_PROVIDER_SITE_OTHER): Payer: Medicare HMO | Admitting: Surgery

## 2020-04-17 VITALS — BP 124/76 | HR 76 | Temp 98.1°F | Ht 64.0 in | Wt 173.0 lb

## 2020-04-17 DIAGNOSIS — H903 Sensorineural hearing loss, bilateral: Secondary | ICD-10-CM | POA: Diagnosis not present

## 2020-04-17 DIAGNOSIS — K9189 Other postprocedural complications and disorders of digestive system: Secondary | ICD-10-CM | POA: Diagnosis not present

## 2020-04-17 NOTE — Patient Instructions (Signed)
Our surgery scheduler Pamala Hurry will contact you within the next 24-48 hours to discuss the preparation prior to surgery and also discuss the times to align with your schedule. Please have the BLUE sheet when she contacts you. If you have any questions or concerns, please do not hesitate to give our office a call.  Hiatal Hernia  A hiatal hernia occurs when part of the stomach slides above the muscle that separates the abdomen from the chest (diaphragm). A person can be born with a hiatal hernia (congenital), or it may develop over time. In almost all cases of hiatal hernia, only the top part of the stomach pushes through the diaphragm. Many people have a hiatal hernia with no symptoms. The larger the hernia, the more likely it is that you will have symptoms. In some cases, a hiatal hernia allows stomach acid to flow back into the tube that carries food from your mouth to your stomach (esophagus). This may cause heartburn symptoms. Severe heartburn symptoms may mean that you have developed a condition called gastroesophageal reflux disease (GERD). What are the causes? This condition is caused by a weakness in the opening (hiatus) where the esophagus passes through the diaphragm to attach to the upper part of the stomach. A person may be born with a weakness in the hiatus, or a weakness can develop over time. What increases the risk? This condition is more likely to develop in:  Older people. Age is a major risk factor for a hiatal hernia, especially if you are over the age of 10.  Pregnant women.  People who are overweight.  People who have frequent constipation. What are the signs or symptoms? Symptoms of this condition usually develop in the form of GERD symptoms. Symptoms include:  Heartburn.  Belching.  Indigestion.  Trouble swallowing.  Coughing or wheezing.  Sore throat.  Hoarseness.  Chest pain.  Nausea and vomiting. How is this diagnosed? This condition may be diagnosed  during testing for GERD. Tests that may be done include:  X-rays of your stomach or chest.  An upper gastrointestinal (GI) series. This is an X-ray exam of your GI tract that is taken after you swallow a chalky liquid that shows up clearly on the X-ray.  Endoscopy. This is a procedure to look into your stomach using a thin, flexible tube that has a tiny camera and light on the end of it. How is this treated? This condition may be treated by:  Dietary and lifestyle changes to help reduce GERD symptoms.  Medicines. These may include: ? Over-the-counter antacids. ? Medicines that make your stomach empty more quickly. ? Medicines that block the production of stomach acid (H2 blockers). ? Stronger medicines to reduce stomach acid (proton pump inhibitors).  Surgery to repair the hernia, if other treatments are not helping. If you have no symptoms, you may not need treatment. Follow these instructions at home: Lifestyle and activity  Do not use any products that contain nicotine or tobacco, such as cigarettes and e-cigarettes. If you need help quitting, ask your health care provider.  Try to achieve and maintain a healthy body weight.  Avoid putting pressure on your abdomen. Anything that puts pressure on your abdomen increases the amount of acid that may be pushed up into your esophagus. ? Avoid bending over, especially after eating. ? Raise the head of your bed by putting blocks under the legs. This keeps your head and esophagus higher than your stomach. ? Do not wear tight clothing around your  chest or stomach. ? Try not to strain when having a bowel movement, when urinating, or when lifting heavy objects. Eating and drinking  Avoid foods that can worsen GERD symptoms. These may include: ? Fatty foods, like fried foods. ? Citrus fruits, like oranges or lemon. ? Other foods and drinks that contain acid, like orange juice or tomatoes. ? Spicy food. ? Chocolate.  Eat frequent small  meals instead of three large meals a day. This helps prevent your stomach from getting too full. ? Eat slowly. ? Do not lie down right after eating. ? Do not eat 1-2 hours before bed.  Do not drink beverages with caffeine. These include cola, coffee, cocoa, and tea.  Do not drink alcohol. General instructions  Take over-the-counter and prescription medicines only as told by your health care provider.  Keep all follow-up visits as told by your health care provider. This is important. Contact a health care provider if:  Your symptoms are not controlled with medicines or lifestyle changes.  You are having trouble swallowing.  You have coughing or wheezing that will not go away. Get help right away if:  Your pain is getting worse.  Your pain spreads to your arms, neck, jaw, teeth, or back.  You have shortness of breath.  You sweat for no reason.  You feel sick to your stomach (nauseous) or you vomit.  You vomit blood.  You have bright red blood in your stools.  You have black, tarry stools. This information is not intended to replace advice given to you by your health care provider. Make sure you discuss any questions you have with your health care provider. Document Revised: 02/21/2017 Document Reviewed: 10/14/2016 Elsevier Patient Education  [place].

## 2020-04-17 NOTE — Progress Notes (Signed)
Outpatient Surgical Follow Up  04/17/2020  Elizabeth Crawford is an 75 y.o. female.   Chief Complaint  Patient presents with  . Follow-up    Confirmed.Marland KitchenMarland KitchenFollow up - barium swallow 03/10/20 follow up - Dr Dahlia Byes, CT done 04/13/20    HPI: 75 year old female well-known to me with a history of paraesophageal hernia repair 10/2018. she  Did very well initially and then last couple months continue to have regurgitation. She Had a prior work-up to include barium swallow that at that time the wrap was intact.  We repeated that study last week and this showed evidence of a slipped fundoplication and likely recurred paraesophageal hernia.  She continues to have some regurgitation and reflux.  She now started to have cough.  No fevers no chills she is able to swallow.  He is otherwise doing well and her pulmonary status still good.  Have shared pictures with them and show them the barium swallow.  She is accompanied by her husband \\Most  recent repeated CT shows evidence of small recurrent paraesophageal hernia.  There is no evidence of incarceration or strangulation. He continues to have nausea and some intermittent emesis   Past Medical History:  Diagnosis Date  . Allergy   . Arthritis   . Complication of anesthesia   . DVT (deep venous thrombosis) ([place])   . GERD (gastroesophageal reflux disease)   . H/O seasonal allergies   . PONV (postoperative nausea and vomiting)    Gets sick every time  . Pulmonary embolism ([place])   . Sleep apnea   . Spondylolisthesis     Past Surgical History:  Procedure Laterality Date  . BACK SURGERY     x2  . BLADDER SUSPENSION  2009  . BREAST REDUCTION SURGERY Bilateral 03/01/2019   Procedure: BILATERAL BREAST REDUCTION;  Surgeon: Wallace Going, DO;  Location: [facility];  Service: Plastics;  Laterality: Bilateral;  total case 4 hours  . CARPAL TUNNEL RELEASE  2003   Left hand  . CHOLECYSTECTOMY  2000   Laparoscopic  . COLONOSCOPY W/ POLYPECTOMY  2014  .  COLONOSCOPY WITH PROPOFOL N/A 05/22/2017   Procedure: COLONOSCOPY WITH PROPOFOL;  Surgeon: Lollie Sails, MD;  Location: [facility] ENDOSCOPY;  Service: Endoscopy;  Laterality: N/A;  . DECOMPRESSIVE LUMBAR LAMINECTOMY LEVEL 2 N/A 11/09/2015   Procedure: DECOMPRESSION L2-5 REMOVAL PEDICULE SCREWS L4-5;  Surgeon: Melina Schools, MD;  Location: [facility];  Service: Orthopedics;  Laterality: N/A;  . ESOPHAGOGASTRODUODENOSCOPY (EGD) WITH PROPOFOL N/A 10/09/2018   Procedure: ESOPHAGOGASTRODUODENOSCOPY (EGD) WITH PROPOFOL;  Surgeon: Lollie Sails, MD;  Location: [facility] ENDOSCOPY;  Service: Endoscopy;  Laterality: N/A;  . EYE SURGERY Bilateral 2005   cataract surgery   . HARDWARE REMOVAL N/A 11/09/2015   Procedure: HARDWARE REMOVAL;  Surgeon: Melina Schools, MD;  Location: [facility];  Service: Orthopedics;  Laterality: N/A;  . [place]  . IVC filter placement  02-01-10  . REDUCTION MAMMAPLASTY Bilateral 03/01/2019  . SPINE SURGERY  5 23-12   anterolateral fusion/ rodding  . TONSILLECTOMY    . TUBAL LIGATION  1977    Family History  Problem Relation Age of Onset  . COPD Mother   . Heart disease Mother   . Aortic aneurysm Mother   . Heart disease Father     Social History:  reports that she quit smoking about 44 years ago. Her smoking use included cigarettes. She quit after 15.00 years of use. She has never used smokeless tobacco. She reports current alcohol use.  She reports that she does not use drugs.  Allergies:  Allergies  Allergen Reactions  . Lidocaine Other (See Comments)    UTI (polyuria, dysuria) and facial flushing  . Tape     Paper and adhesive tape make blisters and redness and difficult to remove even on the first day  . Elemental Sulfur Nausea And Vomiting  . Methylprednisolone Palpitations  . Sulfasalazine Nausea Only    Medications reviewed.    ROS Full ROS performed and is otherwise negative other than what is stated in HPI   BP 124/76   Pulse 76   Temp  98.1 F (36.7 C) (Oral)   Ht 5\' 4"  (1.626 m)   Wt 173 lb (78.5 kg)   SpO2 97%   BMI 29.70 kg/m   Physical Exam Vitals and nursing note reviewed. Exam conducted with a chaperone present.  Constitutional:      General: She is not in acute distress.    Appearance: Normal appearance. She is normal weight.  Cardiovascular:     Rate and Rhythm: Normal rate and regular rhythm.     Heart sounds: No murmur heard.   Pulmonary:     Effort: Pulmonary effort is normal. No respiratory distress.     Breath sounds: Normal breath sounds.  Musculoskeletal:     Cervical back: Normal range of motion and neck supple. No rigidity or tenderness.  Skin:    General: Skin is warm and dry.     Capillary Refill: Capillary refill takes less than 2 seconds.  Neurological:     General: No focal deficit present.     Mental Status: She is alert and oriented to person, place, and time.  Psychiatric:        Mood and Affect: Mood normal.        Behavior: Behavior normal.        Thought Content: Thought content normal.        Judgment: Judgment normal.     Assessment/Plan: 75 year old female with recurrent paraesophageal hernia this time the recurrence is mild but there is evidence of severe this in more importantly patient has recurrent symptoms.  Discussed with patient in detail about her disease process.  I personally showed her all the imaging studies.  She will undergo another upper endoscopy in the next week or so.  I do think that she will need revision of the  fundoplication and repair of paraesophageal hernia.  Cussed with her in detail that at this time I will be using mesh and bIo-a to reinforce the diaphragm. Procedure discussed with patient detail.  Risks, benefits,   possible COMPLICATIONS including but not limited to: Bleeding,esophageal injuries, We will tentatively place her on the schedule for next month.  Due to Covid search we are not doing any elective surgeries that will require admission.   I had an extensive discussion with her and the husband about the situation.  To be on the safe side we will schedule her for March Greater than 50% of the 45 minutes  visit was spent in counseling/coordination of care   Caroleen Hamman, MD [facility]

## 2020-04-18 ENCOUNTER — Telehealth: Payer: Self-pay | Admitting: Surgery

## 2020-04-18 NOTE — Telephone Encounter (Signed)
Outgoing call is made, left message for patient to call.  Please advise patient of Pre-Admission date/time, COVID Testing date and Surgery date.  Surgery Date: 05/23/20 Preadmission Testing Date: 05/12/20 (phone 8am to 1pm) Covid Testing Date: 05/19/20 - patient advised to go to the [facility] ([place]) between 8a-1p  Also patient to call [phone], between 1-3:00pm the day before surgery, to find out what time to arrive for surgery.

## 2020-04-18 NOTE — Telephone Encounter (Signed)
Patient calls back, now informed of all dates regarding her surgery and voices understanding.

## 2020-04-26 ENCOUNTER — Other Ambulatory Visit: Payer: Self-pay

## 2020-04-26 ENCOUNTER — Encounter: Payer: Self-pay | Admitting: Gastroenterology

## 2020-04-26 ENCOUNTER — Ambulatory Visit: Payer: Medicare HMO | Admitting: Gastroenterology

## 2020-04-26 VITALS — BP 116/75 | HR 73 | Ht 64.0 in | Wt 172.6 lb

## 2020-04-26 DIAGNOSIS — R9389 Abnormal findings on diagnostic imaging of other specified body structures: Secondary | ICD-10-CM

## 2020-04-26 NOTE — Progress Notes (Signed)
Jonathon Bellows MD, MRCP(U.K) [address]  [place]  [place], [place] [address]  Main: [phone]  Fax: [phone]   Gastroenterology Consultation  Referring Provider:     Jules Husbands, MD Primary Care Physician:  [place] Pink, MD Primary Gastroenterologist:  Dr. Jonathon Bellows  Reason for Consultation: Vomiting        HPI:   Elizabeth Crawford is a 75 y.o. y/o female referred for evaluation of vomiting by Dr. Dahlia Byes.  He was previously seen by Dr. Gustavo Lah at [facility] clinic gastroenterology.  He has a history of a hiatal hernia and chronic issues with microaspiration plan was for an upper endoscopy at that point of time.  Barium swallow in December 2021 demonstrated slipped Nissen with herniation of the remaining upper stomach into the chest with narrowing of the channel of the esophageal hiatus.  Possible esophagitis.  Fold thickening the stomach slightly more irregular than expected for simple esophagitis and endoscopic evaluation was suggested.  Transient arrest of the barium tablet in the mid esophagus at the aortic impression and further evaluation was recommended.  In January 2022 underwent a CT scan of the chest with contrast that showed recurrent small to moderate hiatal hernia mildly patulous thoracic esophagus filled with oral contrast suggesting esophageal dysmotility or GERD.  Multiple bilateral pulmonary nodules.  At the same time also had a CT abdomen as well.  She underwent an endoscopy in July 2020 by Dr. Gustavo Lah noted to have a medium sized hiatal hernia with multiple Cameron ulcers, multiple benign-appearing polyps were seen in the gastric cardia.  When seen by Dr. Dahlia Byes  on 04/17/2020 plan was to perform a revision surgery of the hiatal hernia repair in March 2022 and he has requested an endoscopy prior to that.  She states that after her first surgery for her hernia in 2020 she had significant resolution of her symptoms predominantly aspiration.  Presently she has on  and off symptoms of regurgitation/vomiting usually in the morning when she wakes up and the contents are usually foamy material and no food is included.  Denies any dysphagia.  Denies any weight loss.   Past Medical History:  Diagnosis Date  . Allergy   . Arthritis   . Complication of anesthesia   . DVT (deep venous thrombosis) ([place])   . GERD (gastroesophageal reflux disease)   . H/O seasonal allergies   . PONV (postoperative nausea and vomiting)    Gets sick every time  . Pulmonary embolism ([place])   . Sleep apnea   . Spondylolisthesis     Past Surgical History:  Procedure Laterality Date  . BACK SURGERY     x2  . BLADDER SUSPENSION  2009  . BREAST REDUCTION SURGERY Bilateral 03/01/2019   Procedure: BILATERAL BREAST REDUCTION;  Surgeon: Wallace Going, DO;  Location: [place];  Service: Plastics;  Laterality: Bilateral;  total case 4 hours  . CARPAL TUNNEL RELEASE  2003   Left hand  . CHOLECYSTECTOMY  2000   Laparoscopic  . COLONOSCOPY W/ POLYPECTOMY  2014  . COLONOSCOPY WITH PROPOFOL N/A 05/22/2017   Procedure: COLONOSCOPY WITH PROPOFOL;  Surgeon: Lollie Sails, MD;  Location: [facility] ENDOSCOPY;  Service: Endoscopy;  Laterality: N/A;  . DECOMPRESSIVE LUMBAR LAMINECTOMY LEVEL 2 N/A 11/09/2015   Procedure: DECOMPRESSION L2-5 REMOVAL PEDICULE SCREWS L4-5;  Surgeon: Melina Schools, MD;  Location: [place];  Service: Orthopedics;  Laterality: N/A;  . ESOPHAGOGASTRODUODENOSCOPY (EGD) WITH PROPOFOL N/A 10/09/2018   Procedure: ESOPHAGOGASTRODUODENOSCOPY (EGD) WITH PROPOFOL;  Surgeon: Lollie Sails, MD;  Location: [facility] ENDOSCOPY;  Service: Endoscopy;  Laterality: N/A;  . EYE SURGERY Bilateral 2005   cataract surgery   . HARDWARE REMOVAL N/A 11/09/2015   Procedure: HARDWARE REMOVAL;  Surgeon: Melina Schools, MD;  Location: [place];  Service: Orthopedics;  Laterality: N/A;  . [place]  . IVC filter placement  02-01-10  . REDUCTION MAMMAPLASTY Bilateral 03/01/2019  .  SPINE SURGERY  5 23-12   anterolateral fusion/ rodding  . TONSILLECTOMY    . TUBAL LIGATION  1977    Prior to Admission medications   Medication Sig Start Date End Date Taking? Authorizing Provider  aspirin 81 MG EC tablet Take 81 mg by mouth daily.     [provider]  cetirizine (ZYRTEC) 10 MG tablet Take 10 mg by mouth daily.    [provider]  chlorpheniramine-HYDROcodone ([place]) 10-8 MG/5ML SUER Take by mouth. Patient not taking: Reported on 04/17/2020 03/22/20   [provider]  clotrimazole-betamethasone (LOTRISONE) cream Apply 1 application topically daily as needed (itching with skin folds).  10/29/18   [provider]  metoCLOPramide (REGLAN) 5 MG tablet Take 1 tablet (5 mg total) by mouth every 8 (eight) hours as needed for nausea or vomiting. Patient not taking: Reported on 04/17/2020 05/05/19   Jules Husbands, MD  Multiple Vitamin (MULTIVITAMIN WITH MINERALS) TABS tablet Take 1 tablet by mouth 4 (four) times a week.  Patient not taking: Reported on 04/17/2020    [provider]  naproxen sodium (ALEVE) 220 MG tablet Take 220-440 mg by mouth 2 (two) times daily as needed (pain).     [provider]  omeprazole (PRILOSEC) 40 MG capsule Take 40 mg by mouth daily. 07/06/19   [provider]  rosuvastatin (CRESTOR) 5 MG tablet Take 5 mg by mouth at bedtime.  04/23/17   [provider]  triamcinolone (KENALOG) 0.025 % ointment Apply 1 application topically 2 (two) times daily. 03/04/19   Scheeler, Carola Rhine, PA-C    Family History  Problem Relation Age of Onset  . COPD Mother   . Heart disease Mother   . Aortic aneurysm Mother   . Heart disease Father      Social History   Tobacco Use  . Smoking status: Former Smoker    Years: 15.00    Types: Cigarettes    Quit date: 02/26/1976    Years since quitting: 44.1  . Smokeless tobacco: Never Used  Vaping Use  . Vaping Use: Never used  Substance Use Topics   . Alcohol use: Yes    Comment: rarely  . Drug use: No    Allergies as of 04/26/2020 - Review Complete 04/26/2020  Allergen Reaction Noted  . Lidocaine Other (See Comments) 09/30/2015  . Tape  10/20/2018  . Elemental sulfur Nausea And Vomiting 11/28/1998  . Methylprednisolone Palpitations 06/30/2015  . Sulfasalazine Nausea Only 05/27/2014    Review of Systems:    All systems reviewed and negative except where noted in HPI.   Physical Exam:  BP 116/75 (BP Location: Left Arm, Patient Position: Sitting, Cuff Size: Large)   Pulse 73   Ht 5\' 4"  (1.626 m)   Wt 172 lb 9.6 oz (78.3 kg)   BMI 29.63 kg/m  No LMP recorded. Patient is postmenopausal. Psych:  Alert and cooperative. Normal mood and affect. General:   Alert,  Well-developed, well-nourished, pleasant and cooperative in NAD Head:  Normocephalic and atraumatic. Eyes:  Sclera clear, no icterus.  Conjunctiva pink. Lungs:  Respirations even and unlabored.  Clear throughout to auscultation.   No wheezes, crackles, or rhonchi. No acute distress. Heart:  Regular rate and rhythm; no murmurs, clicks, rubs, or gallops. Abdomen:  Normal bowel sounds.  No bruits.  Soft, non-tender and non-distended without masses, hepatosplenomegaly or hernias noted.  No guarding or rebound tenderness.    Neurologic:  Alert and oriented x3;  grossly normal neurologically. Psych:  Alert and cooperative. Normal mood and affect.  Imaging Studies: CT Chest W Contrast  Result Date: 04/13/2020 CLINICAL DATA:  History of paraesophageal hernia repair in September 2020, with chronic loss of appetite and worsening nausea and vomiting for 1 year. Additional history of cholecystectomy and appendectomy. Remote smoking history. EXAM: CT CHEST AND ABDOMEN WITH CONTRAST TECHNIQUE: Multidetector CT imaging of the chest and abdomen was performed following the standard protocol during bolus administration of intravenous contrast. CONTRAST:  177mL OMNIPAQUE IOHEXOL 300 MG/ML   SOLN COMPARISON:  03/10/2020 esophagram. 01/28/2019 CT abdomen/pelvis. 05/27/2018 chest CT. FINDINGS: CT CHEST FINDINGS Cardiovascular: Normal heart size. No significant pericardial effusion/thickening. Left anterior descending coronary atherosclerosis. Atherosclerotic nonaneurysmal thoracic aorta. Normal caliber pulmonary arteries. No central pulmonary emboli. Mediastinum/Nodes: No discrete thyroid nodules. Mildly patulous thoracic esophagus filled with oral contrast. No pathologically enlarged axillary, mediastinal or hilar lymph nodes. Lungs/Pleura: No pneumothorax. No pleural effusion. No acute consolidative airspace disease or lung masses. Numerous (greater than 10) solid pulmonary nodules scattered in both lungs, largest 5 mm in the posterior right upper lobe (series 3/image 44) and 5 mm in the left lower lobe (series 3/image 95), all stable since 05/27/2018 chest CT and considered benign. No new significant pulmonary nodules. Stable tiny calcified posterior right upper lobe granuloma. Musculoskeletal: No aggressive appearing focal osseous lesions. Moderate thoracic spondylosis. CT ABDOMEN FINDINGS Hepatobiliary: Normal liver with no liver mass. Cholecystectomy. No biliary ductal dilatation. Pancreas: Normal, with no mass or duct dilation. Spleen: Normal size. No mass. Adrenals/Urinary Tract: Normal adrenals. Normal kidneys with no hydronephrosis and no renal mass. Stomach/Bowel: Recurrent small to moderate hiatal hernia. Otherwise normal nondistended stomach. Visualized small and large bowel is normal caliber, with no bowel wall thickening. Vascular/Lymphatic: Atherosclerotic nonaneurysmal abdominal aorta. IVC filter in place below the level of the renal veins. Patent portal, splenic, hepatic and renal veins. No pathologically enlarged lymph nodes in the abdomen. Other: No pneumoperitoneum, ascites or focal fluid collection. Musculoskeletal: No aggressive appearing focal osseous lesions. Moderate multilevel  lumbar degenerative disc disease. Interbody spacer in the lower lumbar spine. IMPRESSION: 1. Recurrent small to moderate hiatal hernia. Mildly patulous thoracic esophagus filled with oral contrast, suggesting esophageal dysmotility and/or gastroesophageal reflux. 2. Numerous small bilateral pulmonary nodules are all stable since 05/27/2018 chest CT, considered benign. No active pulmonary disease. 3. No acute abnormality in the abdomen. 4. One vessel coronary atherosclerosis. 5. Aortic Atherosclerosis (ICD10-I70.0). Electronically Signed   By: Ilona Sorrel M.D.   On: 04/13/2020 09:09   CT ABDOMEN W CONTRAST  Result Date: 04/13/2020 CLINICAL DATA:  History of paraesophageal hernia repair in September 2020, with chronic loss of appetite and worsening nausea and vomiting for 1 year. Additional history of cholecystectomy and appendectomy. Remote smoking history. EXAM: CT CHEST AND ABDOMEN WITH CONTRAST TECHNIQUE: Multidetector CT imaging of the chest and abdomen was performed following the standard protocol during bolus administration of intravenous contrast. CONTRAST:  151mL OMNIPAQUE IOHEXOL 300 MG/ML  SOLN COMPARISON:  03/10/2020 esophagram. 01/28/2019 CT abdomen/pelvis. 05/27/2018 chest CT. FINDINGS: CT CHEST FINDINGS Cardiovascular: Normal heart size. No  significant pericardial effusion/thickening. Left anterior descending coronary atherosclerosis. Atherosclerotic nonaneurysmal thoracic aorta. Normal caliber pulmonary arteries. No central pulmonary emboli. Mediastinum/Nodes: No discrete thyroid nodules. Mildly patulous thoracic esophagus filled with oral contrast. No pathologically enlarged axillary, mediastinal or hilar lymph nodes. Lungs/Pleura: No pneumothorax. No pleural effusion. No acute consolidative airspace disease or lung masses. Numerous (greater than 10) solid pulmonary nodules scattered in both lungs, largest 5 mm in the posterior right upper lobe (series 3/image 44) and 5 mm in the left lower lobe  (series 3/image 95), all stable since 05/27/2018 chest CT and considered benign. No new significant pulmonary nodules. Stable tiny calcified posterior right upper lobe granuloma. Musculoskeletal: No aggressive appearing focal osseous lesions. Moderate thoracic spondylosis. CT ABDOMEN FINDINGS Hepatobiliary: Normal liver with no liver mass. Cholecystectomy. No biliary ductal dilatation. Pancreas: Normal, with no mass or duct dilation. Spleen: Normal size. No mass. Adrenals/Urinary Tract: Normal adrenals. Normal kidneys with no hydronephrosis and no renal mass. Stomach/Bowel: Recurrent small to moderate hiatal hernia. Otherwise normal nondistended stomach. Visualized small and large bowel is normal caliber, with no bowel wall thickening. Vascular/Lymphatic: Atherosclerotic nonaneurysmal abdominal aorta. IVC filter in place below the level of the renal veins. Patent portal, splenic, hepatic and renal veins. No pathologically enlarged lymph nodes in the abdomen. Other: No pneumoperitoneum, ascites or focal fluid collection. Musculoskeletal: No aggressive appearing focal osseous lesions. Moderate multilevel lumbar degenerative disc disease. Interbody spacer in the lower lumbar spine. IMPRESSION: 1. Recurrent small to moderate hiatal hernia. Mildly patulous thoracic esophagus filled with oral contrast, suggesting esophageal dysmotility and/or gastroesophageal reflux. 2. Numerous small bilateral pulmonary nodules are all stable since 05/27/2018 chest CT, considered benign. No active pulmonary disease. 3. No acute abnormality in the abdomen. 4. One vessel coronary atherosclerosis. 5. Aortic Atherosclerosis (ICD10-I70.0). Electronically Signed   By: Ilona Sorrel M.D.   On: 04/13/2020 09:09    Assessment and Plan:   Elizabeth Crawford is a 75 y.o. y/o female has been referred for evaluation of GERD.  History of a prior Nissen fundoplication that has slipped and she is being prepped for surgery in March 2022.  Underwent CT  scan of the chest as well as barium swallow which shows abnormality of the esophagus possibly due to esophagitis as well as holding up of the barium tablet.  EGD has been requested to rule out any obstruction versus stricture.  Plan 1.  EGD within the next 2 to 3 weeks  I have discussed alternative options, risks & benefits,  which include, but are not limited to, bleeding, infection, perforation,respiratory complication & drug reaction.  The patient agrees with this plan & written consent will be obtained.     Follow up as needed  Dr Jonathon Bellows MD,MRCP(U.K)

## 2020-05-12 ENCOUNTER — Other Ambulatory Visit: Payer: Self-pay

## 2020-05-12 ENCOUNTER — Encounter
Admission: RE | Admit: 2020-05-12 | Discharge: 2020-05-12 | Disposition: A | Discharge status: Home / Self Care | Payer: Medicare HMO | Source: Ambulatory Visit | Attending: Surgery | Admitting: Surgery

## 2020-05-12 ENCOUNTER — Other Ambulatory Visit
Admission: RE | Admit: 2020-05-12 | Discharge: 2020-05-12 | Disposition: A | Discharge status: Home / Self Care | Payer: Medicare HMO | Source: Ambulatory Visit | Attending: Gastroenterology | Admitting: Gastroenterology

## 2020-05-12 DIAGNOSIS — Z20822 Contact with and (suspected) exposure to covid-19: Secondary | ICD-10-CM | POA: Diagnosis not present

## 2020-05-12 DIAGNOSIS — G4733 Obstructive sleep apnea (adult) (pediatric): Secondary | ICD-10-CM | POA: Diagnosis not present

## 2020-05-12 DIAGNOSIS — Z01818 Encounter for other preprocedural examination: Secondary | ICD-10-CM | POA: Insufficient documentation

## 2020-05-12 NOTE — Patient Instructions (Signed)
Your procedure is scheduled on: 05/23/20 Report to [facility]. To find out your arrival time please call [phone] between 1PM - 3PM on 05/22/20.  Remember: Instructions that are not followed completely may result in serious medical risk, up to and including death, or upon the discretion of your surgeon and anesthesiologist your surgery may need to be rescheduled.     _X__ 1. Do not eat food or drink any kiquids after midnight the night before your procedure.                 No gum chewing or hard candies.   __X__2.  On the morning of surgery brush your teeth with toothpaste and water, you                 may rinse your mouth with mouthwash if you wish.  Do not swallow any              toothpaste of mouthwash.     _X__ 3.  No Alcohol for 24 hours before or after surgery.   _X__ 4.  Do Not Smoke or use e-cigarettes For 24 Hours Prior to Your Surgery.                 Do not use any chewable tobacco products for at least 6 hours prior to                 surgery.  ____  5.  Bring all medications with you on the day of surgery if instructed.   __X__  6.  Notify your doctor if there is any change in your medical condition      (cold, fever, infections).     Do not wear jewelry, make-up, hairpins, clips or nail polish. Do not wear lotions, powders, or perfumes.  Do not shave 48 hours prior to surgery. Men may shave face and neck. Do not bring valuables to the hospital.    [facility] is not responsible for any belongings or valuables.  Contacts, dentures/partials or body piercings may not be worn into surgery. Bring a case for your contacts, glasses or hearing aids, a denture cup will be supplied. Leave your suitcase in the car. After surgery it may be brought to your room. For patients admitted to the hospital, discharge time is determined by your treatment team.   Patients discharged the day of surgery will not be allowed to drive  home.   Please read over the following fact sheets that you were given:   CHG soap  __X__ Take these medicines the morning of surgery with A SIP OF WATER:    1. omeprazole (PRILOSEC) 40 MG capsule  2. cetirizine (ZYRTEC) 10 MG tablet  3.   4.  5.  6.  ____ Fleet Enema (as directed)   __X__ Use CHG Soap/SAGE wipes as directed  ____ Use inhalers on the day of surgery  ____ Stop metformin/Janumet/Farxiga 2 days prior to surgery    ____ Take 1/2 of usual insulin dose the night before surgery. No insulin the morning          of surgery.   ____ Stop Blood Thinners Coumadin/Plavix/Xarelto/Pleta/Pradaxa/Eliquis/Effient/Aspirin  on   Or contact your Surgeon, Cardiologist or Medical Doctor regarding  ability to stop your blood thinners  __X__ Stop Anti-inflammatories 7 days before surgery such as Advil, Ibuprofen, Motrin,  BC or Goodies Powder, Naprosyn, Naproxen, Aleve, Aspirin    __X__ Stop  all herbal supplements, fish oil or vitamin E until after surgery.    ____ Bring C-Pap to the hospital.

## 2020-05-13 LAB — SARS CORONAVIRUS 2 (TAT 6-24 HRS): SARS Coronavirus 2: NEGATIVE

## 2020-05-15 ENCOUNTER — Encounter
Admission: RE | Admit: 2020-05-15 | Discharge: 2020-05-15 | Disposition: A | Discharge status: Home / Self Care | Payer: Medicare HMO | Source: Ambulatory Visit | Attending: Surgery | Admitting: Surgery

## 2020-05-15 ENCOUNTER — Other Ambulatory Visit: Payer: Self-pay

## 2020-05-15 DIAGNOSIS — Z20822 Contact with and (suspected) exposure to covid-19: Secondary | ICD-10-CM | POA: Diagnosis not present

## 2020-05-15 DIAGNOSIS — Z01818 Encounter for other preprocedural examination: Secondary | ICD-10-CM | POA: Insufficient documentation

## 2020-05-15 DIAGNOSIS — G4733 Obstructive sleep apnea (adult) (pediatric): Secondary | ICD-10-CM | POA: Diagnosis not present

## 2020-05-15 LAB — CBC
HCT: 36.5 % (ref 36.0–46.0)
Hemoglobin: 12.1 g/dL (ref 12.0–15.0)
MCH: 31.4 pg (ref 26.0–34.0)
MCHC: 33.2 g/dL (ref 30.0–36.0)
MCV: 94.8 fL (ref 80.0–100.0)
Platelets: 152 10*3/uL (ref 150–400)
RBC: 3.85 MIL/uL — ABNORMAL LOW (ref 3.87–5.11)
RDW: 13 % (ref 11.5–15.5)
WBC: 6.5 10*3/uL (ref 4.0–10.5)
nRBC: 0 % (ref 0.0–0.2)

## 2020-05-16 ENCOUNTER — Encounter: Payer: Self-pay | Admitting: Gastroenterology

## 2020-05-16 ENCOUNTER — Ambulatory Visit
Admission: RE | Admit: 2020-05-16 | Discharge: 2020-05-16 | Disposition: A | Discharge status: Home / Self Care | Payer: Medicare HMO | Attending: Gastroenterology | Admitting: Gastroenterology

## 2020-05-16 ENCOUNTER — Encounter
Admission: RE | Disposition: A | Discharge status: Home / Self Care | Payer: Self-pay | Source: Home / Self Care | Attending: Gastroenterology

## 2020-05-16 ENCOUNTER — Ambulatory Visit: Payer: Medicare HMO | Admitting: Certified Registered Nurse Anesthetist

## 2020-05-16 DIAGNOSIS — Z884 Allergy status to anesthetic agent status: Secondary | ICD-10-CM | POA: Insufficient documentation

## 2020-05-16 DIAGNOSIS — K449 Diaphragmatic hernia without obstruction or gangrene: Secondary | ICD-10-CM | POA: Diagnosis not present

## 2020-05-16 DIAGNOSIS — Z7952 Long term (current) use of systemic steroids: Secondary | ICD-10-CM | POA: Insufficient documentation

## 2020-05-16 DIAGNOSIS — I872 Venous insufficiency (chronic) (peripheral): Secondary | ICD-10-CM | POA: Diagnosis not present

## 2020-05-16 DIAGNOSIS — K229 Disease of esophagus, unspecified: Secondary | ICD-10-CM | POA: Diagnosis not present

## 2020-05-16 DIAGNOSIS — Z86718 Personal history of other venous thrombosis and embolism: Secondary | ICD-10-CM | POA: Insufficient documentation

## 2020-05-16 DIAGNOSIS — Z7982 Long term (current) use of aspirin: Secondary | ICD-10-CM | POA: Insufficient documentation

## 2020-05-16 DIAGNOSIS — Z86711 Personal history of pulmonary embolism: Secondary | ICD-10-CM | POA: Insufficient documentation

## 2020-05-16 DIAGNOSIS — Z882 Allergy status to sulfonamides status: Secondary | ICD-10-CM | POA: Insufficient documentation

## 2020-05-16 DIAGNOSIS — Z79899 Other long term (current) drug therapy: Secondary | ICD-10-CM | POA: Diagnosis not present

## 2020-05-16 DIAGNOSIS — Z8249 Family history of ischemic heart disease and other diseases of the circulatory system: Secondary | ICD-10-CM | POA: Diagnosis not present

## 2020-05-16 DIAGNOSIS — Z888 Allergy status to other drugs, medicaments and biological substances status: Secondary | ICD-10-CM | POA: Diagnosis not present

## 2020-05-16 DIAGNOSIS — E7849 Other hyperlipidemia: Secondary | ICD-10-CM | POA: Diagnosis not present

## 2020-05-16 DIAGNOSIS — K317 Polyp of stomach and duodenum: Secondary | ICD-10-CM | POA: Diagnosis not present

## 2020-05-16 DIAGNOSIS — G4733 Obstructive sleep apnea (adult) (pediatric): Secondary | ICD-10-CM | POA: Diagnosis not present

## 2020-05-16 DIAGNOSIS — D131 Benign neoplasm of stomach: Secondary | ICD-10-CM | POA: Diagnosis not present

## 2020-05-16 DIAGNOSIS — B379 Candidiasis, unspecified: Secondary | ICD-10-CM | POA: Diagnosis not present

## 2020-05-16 DIAGNOSIS — Z87891 Personal history of nicotine dependence: Secondary | ICD-10-CM | POA: Diagnosis not present

## 2020-05-16 DIAGNOSIS — Z91048 Other nonmedicinal substance allergy status: Secondary | ICD-10-CM | POA: Insufficient documentation

## 2020-05-16 DIAGNOSIS — R933 Abnormal findings on diagnostic imaging of other parts of digestive tract: Secondary | ICD-10-CM | POA: Diagnosis not present

## 2020-05-16 DIAGNOSIS — K219 Gastro-esophageal reflux disease without esophagitis: Secondary | ICD-10-CM | POA: Diagnosis not present

## 2020-05-16 DIAGNOSIS — R9389 Abnormal findings on diagnostic imaging of other specified body structures: Secondary | ICD-10-CM | POA: Diagnosis not present

## 2020-05-16 DIAGNOSIS — Z825 Family history of asthma and other chronic lower respiratory diseases: Secondary | ICD-10-CM | POA: Insufficient documentation

## 2020-05-16 HISTORY — DX: Pure hypercholesterolemia, unspecified: E78.00

## 2020-05-16 HISTORY — PX: ESOPHAGOGASTRODUODENOSCOPY (EGD) WITH PROPOFOL: SHX5813

## 2020-05-16 LAB — KOH PREP

## 2020-05-16 SURGERY — ESOPHAGOGASTRODUODENOSCOPY (EGD) WITH PROPOFOL
Anesthesia: General

## 2020-05-16 MED ORDER — LIDOCAINE HCL (CARDIAC) PF 100 MG/5ML IV SOSY
PREFILLED_SYRINGE | INTRAVENOUS | Status: DC | PRN
  Administered 2020-05-16: 50 mg via INTRAVENOUS

## 2020-05-16 MED ORDER — SODIUM CHLORIDE 0.9 % IV SOLN: INTRAVENOUS | Status: DC

## 2020-05-16 MED ORDER — PROPOFOL 500 MG/50ML IV EMUL
INTRAVENOUS | Status: DC | PRN
  Administered 2020-05-16: 150 ug/kg/min via INTRAVENOUS

## 2020-05-16 MED ORDER — PROPOFOL 500 MG/50ML IV EMUL
INTRAVENOUS | Status: AC
  Filled 2020-05-16: qty 50

## 2020-05-16 MED ORDER — PROPOFOL 10 MG/ML IV BOLUS
INTRAVENOUS | Status: DC | PRN
  Administered 2020-05-16: 70 mg via INTRAVENOUS

## 2020-05-16 NOTE — Progress Notes (Signed)
Inform has yeast in esophagus: Commence on diflucan 400 mg on day 1 , then 200 mg a day daily once for 13 more days. Total Rx for 14 days

## 2020-05-16 NOTE — Anesthesia Postprocedure Evaluation (Signed)
Anesthesia Post Note  Patient: Elizabeth Crawford  Procedure(s) Performed: ESOPHAGOGASTRODUODENOSCOPY (EGD) WITH PROPOFOL (N/A )  Patient location during evaluation: Phase II Anesthesia Type: General Level of consciousness: awake and alert, awake and oriented Pain management: pain level controlled Vital Signs Assessment: post-procedure vital signs reviewed and stable Respiratory status: spontaneous breathing, nonlabored ventilation and respiratory function stable Cardiovascular status: blood pressure returned to baseline and stable Postop Assessment: no apparent nausea or vomiting Anesthetic complications: no   No complications documented.   Last Vitals:  Vitals:   05/16/20 0915 05/16/20 0925  BP: 115/71 118/85  Pulse: 63 65  Resp: 17 20  Temp:    SpO2: 100% 100%    Last Pain:  Vitals:   05/16/20 0925  TempSrc:   PainSc: 0-No pain                 Phill Mutter

## 2020-05-16 NOTE — Transfer of Care (Signed)
Immediate Anesthesia Transfer of Care Note  Patient: Elizabeth Crawford  Procedure(s) Performed: ESOPHAGOGASTRODUODENOSCOPY (EGD) WITH PROPOFOL (N/A )  Patient Location: PACU  Anesthesia Type:General  Level of Consciousness: sedated  Airway & Oxygen Therapy: Patient Spontanous Breathing and Patient connected to nasal cannula oxygen  Post-op Assessment: Report given to RN and Post -op Vital signs reviewed and stable  Post vital signs: Reviewed and stable  Last Vitals:  Vitals Value Taken Time  BP 102/66 05/16/20 0857  Temp 36.8 C 05/16/20 0855  Pulse 70 05/16/20 0857  Resp 23 05/16/20 0857  SpO2 97 % 05/16/20 0857  Vitals shown include unvalidated device data.  Last Pain:  Vitals:   05/16/20 0855  TempSrc: Temporal  PainSc: Asleep         Complications: No complications documented.

## 2020-05-16 NOTE — H&P (Signed)
Jonathon Bellows, MD [address], [place], [place], [place], [address] [address], [place], [place], [place], [address] Phone: [phone]  Fax: [phone]  Primary Care Physician:  Maryland Pink, MD   Pre-Procedure History & Physical: HPI:  Elizabeth Crawford is a 75 y.o. female is here for an endoscopy    Past Medical History:  Diagnosis Date  . Allergy   . Arthritis   . Complication of anesthesia   . DVT (deep venous thrombosis) ([place])   . GERD (gastroesophageal reflux disease)   . H/O seasonal allergies   . Hypercholesteremia   . PONV (postoperative nausea and vomiting)    Gets sick every time  . Pulmonary embolism ([place])   . Sleep apnea   . Spondylolisthesis     Past Surgical History:  Procedure Laterality Date  . BACK SURGERY     x2  . BLADDER SUSPENSION  2009  . BREAST REDUCTION SURGERY Bilateral 03/01/2019   Procedure: BILATERAL BREAST REDUCTION;  Surgeon: Wallace Going, DO;  Location: [facility];  Service: Plastics;  Laterality: Bilateral;  total case 4 hours  . CARPAL TUNNEL RELEASE  2003   Left hand  . CHOLECYSTECTOMY  2000   Laparoscopic  . COLONOSCOPY W/ POLYPECTOMY  2014  . COLONOSCOPY WITH PROPOFOL N/A 05/22/2017   Procedure: COLONOSCOPY WITH PROPOFOL;  Surgeon: Lollie Sails, MD;  Location: [facility] ENDOSCOPY;  Service: Endoscopy;  Laterality: N/A;  . DECOMPRESSIVE LUMBAR LAMINECTOMY LEVEL 2 N/A 11/09/2015   Procedure: DECOMPRESSION L2-5 REMOVAL PEDICULE SCREWS L4-5;  Surgeon: Melina Schools, MD;  Location: [place];  Service: Orthopedics;  Laterality: N/A;  . ESOPHAGOGASTRODUODENOSCOPY (EGD) WITH PROPOFOL N/A 10/09/2018   Procedure: ESOPHAGOGASTRODUODENOSCOPY (EGD) WITH PROPOFOL;  Surgeon: Lollie Sails, MD;  Location[facility] ENDOSCOPY;  Service: Endoscopy;  Laterality: N/A;  . EYE SURGERY Bilateral 2005   cataract surgery   . HARDWARE REMOVAL N/A 11/09/2015   Procedure: HARDWARE REMOVAL;  Surgeon: Melina Schools, MD;  Location: [place];  Service:  Orthopedics;  Laterality: N/A;  . [place]  . IVC filter placement  02-01-10  . REDUCTION MAMMAPLASTY Bilateral 03/01/2019  . SPINE SURGERY  5 23-12   anterolateral fusion/ rodding  . TONSILLECTOMY    . TUBAL LIGATION  1977    Prior to Admission medications   Medication Sig Start Date End Date Taking? Authorizing Provider  aspirin 81 MG EC tablet Take 81 mg by mouth at bedtime.   Yes [provider]  cetirizine (ZYRTEC) 10 MG tablet Take 10 mg by mouth daily.   Yes [provider]  omeprazole (PRILOSEC) 40 MG capsule Take 40 mg by mouth daily. 07/06/19  Yes [provider]  rosuvastatin (CRESTOR) 5 MG tablet Take 5 mg by mouth at bedtime.  04/23/17  Yes [provider]  clotrimazole-betamethasone (LOTRISONE) cream Apply 1 application topically daily as needed (itching with skin folds).  10/29/18   [provider]  hydroxypropyl methylcellulose / hypromellose (ISOPTO TEARS / GONIOVISC) 2.5 % ophthalmic solution Place 1 drop into both eyes daily as needed for dry eyes.    [provider]  ibuprofen (ADVIL) 200 MG tablet Take 400 mg by mouth every 8 (eight) hours as needed (pain).    [provider]    Allergies as of 04/26/2020 - Review Complete 04/26/2020  Allergen Reaction Noted  . Lidocaine Other (See Comments) 09/30/2015  . Tape  10/20/2018  . Elemental sulfur Nausea And Vomiting 11/28/1998  . Methylprednisolone  Palpitations 06/30/2015  . Sulfasalazine Nausea Only 05/27/2014    Family History  Problem Relation Age of Onset  . COPD Mother   . Heart disease Mother   . Aortic aneurysm Mother   . Heart disease Father     Social History   Socioeconomic History  . Marital status: Married    Spouse name: Not on file  . Number of children: Not on file  . Years of education: Not on file  . Highest education level: Not on file  Occupational History  . Not on file  Tobacco Use  . Smoking status: Former  Smoker    Years: 15.00    Types: Cigarettes    Quit date: 02/26/1976    Years since quitting: 44.2  . Smokeless tobacco: Never Used  Vaping Use  . Vaping Use: Never used  Substance and Sexual Activity  . Alcohol use: Yes    Comment: rarely  . Drug use: No  . Sexual activity: Not on file  Other Topics Concern  . Not on file  Social History Narrative  . Not on file   Social Determinants of Health   Financial Resource Strain: Not on file  Food Insecurity: Not on file  Transportation Needs: Not on file  Physical Activity: Not on file  Stress: Not on file  Social Connections: Not on file  Intimate Partner Violence: Not on file    Review of Systems: See HPI, otherwise negative ROS  Physical Exam: BP 118/83   Pulse 73   Temp (!) 96.8 F (36 C) (Temporal)   Resp 18   Ht 5\' 4"  (1.626 m)   Wt 78.5 kg   SpO2 97%   BMI 29.70 kg/m  General:   Alert,  pleasant and cooperative in NAD Head:  Normocephalic and atraumatic. Neck:  Supple; no masses or thyromegaly. Lungs:  Clear throughout to auscultation, normal respiratory effort.    Heart:  +S1, +S2, Regular rate and rhythm, No edema. Abdomen:  Soft, nontender and nondistended. Normal bowel sounds, without guarding, and without rebound.   Neurologic:  Alert and  oriented x4;  grossly normal neurologically.  Impression/Plan: Elizabeth Crawford is here for an endoscopy  to be performed for  evaluation of abnormal ct scan     Risks, benefits, limitations, and alternatives regarding endoscopy have been reviewed with the patient.  Questions have been answered.  All parties agreeable.   Jonathon Bellows, MD  05/16/2020, 8:22 AM

## 2020-05-16 NOTE — Anesthesia Preprocedure Evaluation (Signed)
Anesthesia Evaluation  Patient identified by MRN, date of birth, ID band Patient awake    Reviewed: Allergy & Precautions, H&P , NPO status , Patient's Chart, lab work & pertinent test results  History of Anesthesia Complications (+) PONV and history of anesthetic complications  Airway Mallampati: II  TM Distance: >3 FB Neck ROM: limited    Dental  (+) Chipped   Pulmonary neg shortness of breath, sleep apnea and Continuous Positive Airway Pressure Ventilation , former smoker,    Pulmonary exam normal        Cardiovascular Exercise Tolerance: Good (-) angina(-) Past MI and (-) DOE negative cardio ROS Normal cardiovascular exam     Neuro/Psych negative neurological ROS  negative psych ROS   GI/Hepatic Neg liver ROS, GERD  Poorly Controlled,  Endo/Other  negative endocrine ROS  Renal/GU negative Renal ROS  negative genitourinary   Musculoskeletal  (+) Arthritis ,   Abdominal   Peds  Hematology negative hematology ROS (+)   Anesthesia Other Findings Past Medical History: No date: Allergy No date: Arthritis No date: Complication of anesthesia No date: DVT (deep venous thrombosis) ([facility]) No date: GERD (gastroesophageal reflux disease) No date: H/O seasonal allergies No date: PONV (postoperative nausea and vomiting)     Comment:  Gets sick every time No date: Pulmonary embolism ([place]) No date: Sleep apnea No date: Spondylolisthesis  Past Surgical History: 2009: BLADDER SUSPENSION 2003: CARPAL TUNNEL RELEASE     Comment:  Left hand 2000: CHOLECYSTECTOMY     Comment:  Laparoscopic 2014: COLONOSCOPY W/ POLYPECTOMY 05/22/2017: COLONOSCOPY WITH PROPOFOL; N/A     Comment:  Procedure: COLONOSCOPY WITH PROPOFOL;  Surgeon:               Lollie Sails, MD;  Location: [facility] ENDOSCOPY;                Service: Endoscopy;  Laterality: N/A; 11/09/2015: DECOMPRESSIVE LUMBAR LAMINECTOMY LEVEL 2; N/A     Comment:   Procedure: DECOMPRESSION L2-5 REMOVAL PEDICULE SCREWS               L4-5;  Surgeon: Melina Schools, MD;  Location: [place];                Service: Orthopedics;  Laterality: N/A; 2005: EYE SURGERY; Bilateral     Comment:  cataract surgery  11/09/2015: HARDWARE REMOVAL; N/A     Comment:  Procedure: HARDWARE REMOVAL;  Surgeon: Melina Schools,               MD;  Location: [place];  Service: Orthopedics;  Laterality:              N/A; 1978: HEMORRHOID SURGERY 02-01-10: IVC filter placement 5 23-12: SPINE SURGERY     Comment:  anterolateral fusion/ rodding No date: TONSILLECTOMY 1977: TUBAL LIGATION  BMI    Body Mass Index: 34.36 kg/m      Reproductive/Obstetrics negative OB ROS                             Anesthesia Physical  Anesthesia Plan  ASA: III  Anesthesia Plan: General   Post-op Pain Management:    Induction: Intravenous  PONV Risk Score and Plan: Propofol infusion and TIVA  Airway Management Planned: Natural Airway and Nasal Cannula  Additional Equipment:   Intra-op Plan:   Post-operative Plan:   Informed Consent: I have reviewed the patients History and Physical, chart, labs and discussed the procedure  including the risks, benefits and alternatives for the proposed anesthesia with the patient or authorized representative who has indicated his/her understanding and acceptance.     Dental Advisory Given  Plan Discussed with: Anesthesiologist, CRNA and Surgeon  Anesthesia Plan Comments: (Patient consented for risks of anesthesia including but not limited to:  - adverse reactions to medications - risk of intubation if required - damage to teeth, lips or other oral mucosa - sore throat or hoarseness - Damage to heart, brain, lungs or loss of life  Patient voiced understanding.)        Anesthesia Quick Evaluation

## 2020-05-16 NOTE — Anesthesia Procedure Notes (Signed)
Date/Time: 05/16/2020 8:33 AM Performed by: Johnna Acosta, CRNA Pre-anesthesia Checklist: Patient identified, Emergency Drugs available, Suction available, Patient being monitored and Timeout performed Patient Re-evaluated:Patient Re-evaluated prior to induction Oxygen Delivery Method: Simple face mask Preoxygenation: Pre-oxygenation with 100% oxygen Induction Type: IV induction

## 2020-05-16 NOTE — Op Note (Signed)
[facility] Gastroenterology Patient Name: Elizabeth Crawford Procedure Date: 05/16/2020 8:31 AM MRN: [id number] Account #: [id number] Date of Birth: [birth year] Admit Type: Outpatient Age: 75 Room: [facility] ENDO ROOM 2 Gender: Female Note Status: Finalized Procedure:             Upper GI endoscopy Indications:           Abnormal CT of the GI tract Providers:             Jonathon Bellows MD, MD Referring MD:          Irven Easterly. Kary Kos, MD (Referring MD) Medicines:             Monitored Anesthesia Care Complications:         No immediate complications. Procedure:             Pre-Anesthesia Assessment:                        - Prior to the procedure, a History and Physical was                         performed, and patient medications, allergies and                         sensitivities were reviewed. The patient's tolerance                         of previous anesthesia was reviewed.                        - The risks and benefits of the procedure and the                         sedation options and risks were discussed with the                         patient. All questions were answered and informed                         consent was obtained.                        - ASA Grade Assessment: II - A patient with mild                         systemic disease.                        After obtaining informed consent, the endoscope was                         passed under direct vision. Throughout the procedure,                         the patient's blood pressure, pulse, and oxygen                         saturations were monitored continuously. The Endoscope                         was introduced  through the mouth, and advanced to the                         third part of duodenum. The upper GI endoscopy was                         accomplished with ease. The patient tolerated the                         procedure well. Findings:      Localized, white plaques were found in the  lower third of the esophagus.       Brushings for KOH prep were obtained in the lower third of the esophagus.      A medium-sized hiatal hernia was present.      Multiple 7 to 12 mm sessile polyps with no bleeding and no stigmata of       recent bleeding were found on the greater curvature of the stomach. The       polyp was removed with a hot snare. Resection and retrieval were       complete.      The cardia and gastric fundus were normal on retroflexion. Impression:            - Esophageal plaques were found, suspicious for                         candidiasis. Brushings performed.                        - Medium-sized hiatal hernia.                        - Multiple gastric polyps. Resected and retrieved. Recommendation:        - Discharge patient to home (with escort).                        - Resume previous diet.                        - Continue present medications.                        - Await pathology results.                        - Return to my office as previously scheduled. Procedure Code(s):     --- Professional ---                        [phone], Esophagogastroduodenoscopy, flexible,                         transoral; with removal of tumor(s), polyp(s), or                         other lesion(s) by snare technique Diagnosis Code(s):     --- Professional ---                        K22.9, Disease of esophagus, unspecified  K44.9, Diaphragmatic hernia without obstruction or                         gangrene                        K31.7, Polyp of stomach and duodenum                        R93.3, Abnormal findings on diagnostic imaging of                         other parts of digestive tract CPT copyright 2019 American Medical Association. All rights reserved. The codes documented in this report are preliminary and upon coder review may  be revised to meet current compliance requirements. Jonathon Bellows, MD Jonathon Bellows MD, MD 05/16/2020 8:51:10 AM This  report has been signed electronically. Number of Addenda: 0 Note Initiated On: 05/16/2020 8:31 AM Estimated Blood Loss:  Estimated blood loss: none.      [facility]

## 2020-05-17 ENCOUNTER — Encounter: Payer: Self-pay | Admitting: Gastroenterology

## 2020-05-17 LAB — SURGICAL PATHOLOGY

## 2020-05-18 ENCOUNTER — Telehealth: Payer: Self-pay | Admitting: Surgery

## 2020-05-18 ENCOUNTER — Telehealth: Payer: Self-pay

## 2020-05-18 MED ORDER — FLUCONAZOLE 200 MG PO TABS: ORAL_TABLET | ORAL | 0 refills | Status: DC

## 2020-05-18 NOTE — Telephone Encounter (Signed)
-----   Message from Jonathon Bellows, MD sent at 05/16/2020 10:24 AM EST ----- Inform has yeast in esophagus: Commence on diflucan 400 mg on day 1 , then 200 mg a day daily once for 13 more days. Total Rx for 14 days

## 2020-05-18 NOTE — Telephone Encounter (Signed)
Inquiry sent to Dr Hampton Abbot about this.

## 2020-05-18 NOTE — Telephone Encounter (Signed)
Pt has been notified of results and Dr. Anna's recommendations. 

## 2020-05-18 NOTE — Telephone Encounter (Signed)
Incoming call from the patient.  She is having surgery with Dr. Dahlia Byes on March 1st.  She was just recently told that she has a yeast infection in her esophagus.  She was prescribed #15 Diflucan.  Before she gets this filled which would cost her $70.00 she wants to make sure this will be ok to take prior to her having surgery next Tuesday.  Please call.  Thank you.

## 2020-05-18 NOTE — Progress Notes (Signed)
No it wont

## 2020-05-19 ENCOUNTER — Other Ambulatory Visit
Admission: RE | Admit: 2020-05-19 | Discharge: 2020-05-19 | Disposition: A | Discharge status: Home / Self Care | Payer: Medicare HMO | Source: Ambulatory Visit | Attending: Surgery | Admitting: Surgery

## 2020-05-19 ENCOUNTER — Other Ambulatory Visit: Payer: Self-pay

## 2020-05-19 DIAGNOSIS — Z01812 Encounter for preprocedural laboratory examination: Secondary | ICD-10-CM | POA: Insufficient documentation

## 2020-05-19 DIAGNOSIS — Z20822 Contact with and (suspected) exposure to covid-19: Secondary | ICD-10-CM | POA: Diagnosis not present

## 2020-05-19 LAB — SARS CORONAVIRUS 2 (TAT 6-24 HRS): SARS Coronavirus 2: NEGATIVE

## 2020-05-19 NOTE — Telephone Encounter (Signed)
Patient notified to go ahead and start the medication as it will help her esophagus feel better. She is aware and will start the medication.

## 2020-05-22 DIAGNOSIS — K9189 Other postprocedural complications and disorders of digestive system: Secondary | ICD-10-CM | POA: Diagnosis not present

## 2020-05-22 MED ORDER — CHLORHEXIDINE GLUCONATE CLOTH 2 % EX PADS: 6.0000 | MEDICATED_PAD | Freq: Once | CUTANEOUS | Status: DC

## 2020-05-22 MED ORDER — ORAL CARE MOUTH RINSE: 15.0000 mL | Freq: Once | OROMUCOSAL | Status: AC

## 2020-05-22 MED ORDER — CEFAZOLIN SODIUM-DEXTROSE 2-4 GM/100ML-% IV SOLN
2.0000 g | INTRAVENOUS | Status: AC
  Administered 2020-05-23: 2 g via INTRAVENOUS

## 2020-05-22 MED ORDER — ACETAMINOPHEN 500 MG PO TABS: 1000.0000 mg | ORAL_TABLET | ORAL | Status: AC

## 2020-05-22 MED ORDER — GABAPENTIN 300 MG PO CAPS: 300.0000 mg | ORAL_CAPSULE | ORAL | Status: AC

## 2020-05-22 MED ORDER — HEPARIN SODIUM (PORCINE) 5000 UNIT/ML IJ SOLN: 5000.0000 [IU] | Freq: Once | INTRAMUSCULAR | Status: AC

## 2020-05-22 MED ORDER — SCOPOLAMINE 1 MG/3DAYS TD PT72
1.0000 | MEDICATED_PATCH | TRANSDERMAL | Status: DC
  Administered 2020-05-23: 1.5 mg via TRANSDERMAL

## 2020-05-22 MED ORDER — LACTATED RINGERS IV SOLN: INTRAVENOUS | Status: DC

## 2020-05-22 MED ORDER — CHLORHEXIDINE GLUCONATE 0.12 % MT SOLN: 15.0000 mL | Freq: Once | OROMUCOSAL | Status: AC

## 2020-05-22 MED ORDER — CELECOXIB 200 MG PO CAPS: 200.0000 mg | ORAL_CAPSULE | ORAL | Status: AC

## 2020-05-23 ENCOUNTER — Inpatient Hospital Stay
Admission: RE | Admit: 2020-05-23 | Discharge: 2020-05-24 | DRG: 326 | Disposition: A | Discharge status: Home / Self Care | Payer: Medicare HMO | Attending: Surgery | Admitting: Surgery

## 2020-05-23 ENCOUNTER — Inpatient Hospital Stay: Payer: Medicare HMO | Admitting: Certified Registered Nurse Anesthetist

## 2020-05-23 ENCOUNTER — Other Ambulatory Visit: Payer: Self-pay

## 2020-05-23 ENCOUNTER — Encounter: Payer: Self-pay | Admitting: Surgery

## 2020-05-23 ENCOUNTER — Encounter
Admission: RE | Disposition: A | Discharge status: Home / Self Care | Payer: Self-pay | Source: Home / Self Care | Attending: Surgery

## 2020-05-23 DIAGNOSIS — I872 Venous insufficiency (chronic) (peripheral): Secondary | ICD-10-CM | POA: Diagnosis not present

## 2020-05-23 DIAGNOSIS — Z888 Allergy status to other drugs, medicaments and biological substances status: Secondary | ICD-10-CM

## 2020-05-23 DIAGNOSIS — J9859 Other diseases of mediastinum, not elsewhere classified: Secondary | ICD-10-CM | POA: Diagnosis present

## 2020-05-23 DIAGNOSIS — Z0389 Encounter for observation for other suspected diseases and conditions ruled out: Secondary | ICD-10-CM | POA: Diagnosis not present

## 2020-05-23 DIAGNOSIS — Z86711 Personal history of pulmonary embolism: Secondary | ICD-10-CM | POA: Diagnosis not present

## 2020-05-23 DIAGNOSIS — K219 Gastro-esophageal reflux disease without esophagitis: Secondary | ICD-10-CM | POA: Diagnosis present

## 2020-05-23 DIAGNOSIS — Z86718 Personal history of other venous thrombosis and embolism: Secondary | ICD-10-CM | POA: Diagnosis not present

## 2020-05-23 DIAGNOSIS — Z9049 Acquired absence of other specified parts of digestive tract: Secondary | ICD-10-CM

## 2020-05-23 DIAGNOSIS — Z882 Allergy status to sulfonamides status: Secondary | ICD-10-CM | POA: Diagnosis not present

## 2020-05-23 DIAGNOSIS — Z91048 Other nonmedicinal substance allergy status: Secondary | ICD-10-CM

## 2020-05-23 DIAGNOSIS — G4733 Obstructive sleep apnea (adult) (pediatric): Secondary | ICD-10-CM | POA: Diagnosis not present

## 2020-05-23 DIAGNOSIS — K449 Diaphragmatic hernia without obstruction or gangrene: Secondary | ICD-10-CM | POA: Diagnosis not present

## 2020-05-23 DIAGNOSIS — E7849 Other hyperlipidemia: Secondary | ICD-10-CM | POA: Diagnosis not present

## 2020-05-23 DIAGNOSIS — G4730 Sleep apnea, unspecified: Secondary | ICD-10-CM | POA: Diagnosis present

## 2020-05-23 DIAGNOSIS — Z8719 Personal history of other diseases of the digestive system: Secondary | ICD-10-CM

## 2020-05-23 DIAGNOSIS — R32 Unspecified urinary incontinence: Secondary | ICD-10-CM | POA: Diagnosis not present

## 2020-05-23 DIAGNOSIS — Z9851 Tubal ligation status: Secondary | ICD-10-CM

## 2020-05-23 DIAGNOSIS — Z9889 Other specified postprocedural states: Secondary | ICD-10-CM

## 2020-05-23 DIAGNOSIS — Z87891 Personal history of nicotine dependence: Secondary | ICD-10-CM

## 2020-05-23 DIAGNOSIS — K9189 Other postprocedural complications and disorders of digestive system: Secondary | ICD-10-CM

## 2020-05-23 HISTORY — PX: XI ROBOTIC ASSISTED HIATAL HERNIA REPAIR: SHX6889

## 2020-05-23 LAB — CBC
HCT: 38.3 % (ref 36.0–46.0)
Hemoglobin: 12.7 g/dL (ref 12.0–15.0)
MCH: 31.3 pg (ref 26.0–34.0)
MCHC: 33.2 g/dL (ref 30.0–36.0)
MCV: 94.3 fL (ref 80.0–100.0)
Platelets: 132 10*3/uL — ABNORMAL LOW (ref 150–400)
RBC: 4.06 MIL/uL (ref 3.87–5.11)
RDW: 12.7 % (ref 11.5–15.5)
WBC: 8.2 10*3/uL (ref 4.0–10.5)
nRBC: 0 % (ref 0.0–0.2)

## 2020-05-23 LAB — CREATININE, SERUM
Creatinine, Ser: 0.83 mg/dL (ref 0.44–1.00)
GFR, Estimated: >60 mL/min (ref >60)

## 2020-05-23 SURGERY — REPAIR, HERNIA, HIATAL, ROBOT-ASSISTED
Anesthesia: General

## 2020-05-23 MED ORDER — ROCURONIUM BROMIDE 10 MG/ML (PF) SYRINGE
PREFILLED_SYRINGE | INTRAVENOUS | Status: AC
  Filled 2020-05-23: qty 10

## 2020-05-23 MED ORDER — GABAPENTIN 300 MG PO CAPS
ORAL_CAPSULE | ORAL | Status: AC
  Administered 2020-05-23: 300 mg via ORAL
  Filled 2020-05-23: qty 1

## 2020-05-23 MED ORDER — BUPIVACAINE-EPINEPHRINE (PF) 0.25% -1:200000 IJ SOLN
INTRAMUSCULAR | Status: AC
  Filled 2020-05-23: qty 30

## 2020-05-23 MED ORDER — SODIUM CHLORIDE 0.9 % IV SOLN
1.0000 g | Freq: Three times a day (TID) | INTRAVENOUS | Status: DC
  Administered 2020-05-23 – 2020-05-24 (×2): 1 g via INTRAVENOUS
  Filled 2020-05-23 (×4): qty 1

## 2020-05-23 MED ORDER — PROCHLORPERAZINE EDISYLATE 10 MG/2ML IJ SOLN: 5.0000 mg | Freq: Four times a day (QID) | INTRAMUSCULAR | Status: DC | PRN

## 2020-05-23 MED ORDER — OXYCODONE HCL 5 MG PO TABS
ORAL_TABLET | ORAL | Status: AC
  Filled 2020-05-23: qty 1

## 2020-05-23 MED ORDER — THROMBIN 5000 UNITS EX SOLR
CUTANEOUS | Status: DC | PRN
  Administered 2020-05-23: 5000 [IU] via TOPICAL

## 2020-05-23 MED ORDER — FENTANYL CITRATE (PF) 100 MCG/2ML IJ SOLN
INTRAMUSCULAR | Status: DC | PRN
  Administered 2020-05-23 (×2): 50 ug via INTRAVENOUS

## 2020-05-23 MED ORDER — FENTANYL CITRATE (PF) 100 MCG/2ML IJ SOLN: 25.0000 ug | INTRAMUSCULAR | Status: DC | PRN

## 2020-05-23 MED ORDER — ONDANSETRON HCL 4 MG/2ML IJ SOLN: 4.0000 mg | Freq: Once | INTRAMUSCULAR | Status: DC | PRN

## 2020-05-23 MED ORDER — FENTANYL CITRATE (PF) 100 MCG/2ML IJ SOLN
INTRAMUSCULAR | Status: AC
  Filled 2020-05-23: qty 2

## 2020-05-23 MED ORDER — KETAMINE HCL 10 MG/ML IJ SOLN
INTRAMUSCULAR | Status: DC | PRN
  Administered 2020-05-23 (×2): 10 mg via INTRAVENOUS
  Administered 2020-05-23: 30 mg via INTRAVENOUS

## 2020-05-23 MED ORDER — KETOROLAC TROMETHAMINE 15 MG/ML IJ SOLN
15.0000 mg | Freq: Four times a day (QID) | INTRAMUSCULAR | Status: DC
  Administered 2020-05-23 – 2020-05-24 (×3): 15 mg via INTRAVENOUS
  Filled 2020-05-23 (×3): qty 1

## 2020-05-23 MED ORDER — LIDOCAINE HCL (PF) 2 % IJ SOLN
INTRAMUSCULAR | Status: AC
  Filled 2020-05-23: qty 5

## 2020-05-23 MED ORDER — BUPIVACAINE LIPOSOME 1.3 % IJ SUSP
INTRAMUSCULAR | Status: DC | PRN
  Administered 2020-05-23: 20 mL

## 2020-05-23 MED ORDER — SODIUM CHLORIDE 0.9 % IV SOLN: INTRAVENOUS | Status: DC

## 2020-05-23 MED ORDER — SUGAMMADEX SODIUM 200 MG/2ML IV SOLN
INTRAVENOUS | Status: DC | PRN
  Administered 2020-05-23: 300 mg via INTRAVENOUS

## 2020-05-23 MED ORDER — ZOLPIDEM TARTRATE 5 MG PO TABS
5.0000 mg | ORAL_TABLET | Freq: Every evening | ORAL | Status: DC | PRN
Start: 2020-05-23 — End: 2020-05-24

## 2020-05-23 MED ORDER — ONDANSETRON HCL 4 MG/2ML IJ SOLN
4.0000 mg | Freq: Four times a day (QID) | INTRAMUSCULAR | Status: DC | PRN
  Administered 2020-05-23: 4 mg via INTRAVENOUS
  Filled 2020-05-23: qty 2

## 2020-05-23 MED ORDER — OXYCODONE HCL 5 MG PO TABS
5.0000 mg | ORAL_TABLET | ORAL | Status: DC | PRN
  Administered 2020-05-23: 5 mg via ORAL

## 2020-05-23 MED ORDER — PREGABALIN 50 MG PO CAPS
100.0000 mg | ORAL_CAPSULE | Freq: Three times a day (TID) | ORAL | Status: DC
  Administered 2020-05-23: 100 mg via ORAL
  Filled 2020-05-23 (×2): qty 2

## 2020-05-23 MED ORDER — BUPIVACAINE-EPINEPHRINE 0.25% -1:200000 IJ SOLN
INTRAMUSCULAR | Status: DC | PRN
  Administered 2020-05-23: 30 mL

## 2020-05-23 MED ORDER — ROCURONIUM BROMIDE 100 MG/10ML IV SOLN
INTRAVENOUS | Status: DC | PRN
  Administered 2020-05-23 (×2): 50 mg via INTRAVENOUS
  Administered 2020-05-23 (×2): 20 mg via INTRAVENOUS

## 2020-05-23 MED ORDER — MORPHINE SULFATE (PF) 2 MG/ML IV SOLN: 2.0000 mg | INTRAVENOUS | Status: DC | PRN

## 2020-05-23 MED ORDER — ACETAMINOPHEN 500 MG PO TABS
1000.0000 mg | ORAL_TABLET | Freq: Four times a day (QID) | ORAL | Status: DC
  Administered 2020-05-23 – 2020-05-24 (×2): 1000 mg via ORAL
  Filled 2020-05-23 (×2): qty 2

## 2020-05-23 MED ORDER — HEPARIN SODIUM (PORCINE) 5000 UNIT/ML IJ SOLN
INTRAMUSCULAR | Status: AC
  Administered 2020-05-23: 5000 [IU] via SUBCUTANEOUS
  Filled 2020-05-23: qty 1

## 2020-05-23 MED ORDER — ONDANSETRON 4 MG PO TBDP: 4.0000 mg | ORAL_TABLET | Freq: Four times a day (QID) | ORAL | Status: DC | PRN

## 2020-05-23 MED ORDER — DEXMEDETOMIDINE HCL 200 MCG/2ML IV SOLN
INTRAVENOUS | Status: DC | PRN
  Administered 2020-05-23 (×2): 8 ug via INTRAVENOUS

## 2020-05-23 MED ORDER — THROMBIN (RECOMBINANT) 5000 UNITS EX SOLR
CUTANEOUS | Status: AC
  Filled 2020-05-23: qty 5000

## 2020-05-23 MED ORDER — ACETAMINOPHEN 500 MG PO TABS
ORAL_TABLET | ORAL | Status: AC
  Administered 2020-05-23: 1000 mg via ORAL
  Filled 2020-05-23: qty 2

## 2020-05-23 MED ORDER — PHENYLEPHRINE HCL (PRESSORS) 10 MG/ML IV SOLN
INTRAVENOUS | Status: DC | PRN
  Administered 2020-05-23 (×3): 200 ug via INTRAVENOUS

## 2020-05-23 MED ORDER — BUPIVACAINE LIPOSOME 1.3 % IJ SUSP
INTRAMUSCULAR | Status: AC
  Filled 2020-05-23: qty 20

## 2020-05-23 MED ORDER — DEXAMETHASONE SODIUM PHOSPHATE 10 MG/ML IJ SOLN
INTRAMUSCULAR | Status: DC | PRN
  Administered 2020-05-23: 10 mg via INTRAVENOUS

## 2020-05-23 MED ORDER — CELECOXIB 200 MG PO CAPS
ORAL_CAPSULE | ORAL | Status: AC
  Administered 2020-05-23: 200 mg via ORAL
  Filled 2020-05-23: qty 1

## 2020-05-23 MED ORDER — ONDANSETRON HCL 4 MG/2ML IJ SOLN
INTRAMUSCULAR | Status: AC
  Filled 2020-05-23: qty 2

## 2020-05-23 MED ORDER — GLYCOPYRROLATE 0.2 MG/ML IJ SOLN
INTRAMUSCULAR | Status: DC | PRN
  Administered 2020-05-23: .2 mg via INTRAVENOUS

## 2020-05-23 MED ORDER — ASPIRIN EC 81 MG PO TBEC
81.0000 mg | DELAYED_RELEASE_TABLET | Freq: Every day | ORAL | Status: DC
  Administered 2020-05-23: 81 mg via ORAL
  Filled 2020-05-23: qty 1

## 2020-05-23 MED ORDER — PROPOFOL 10 MG/ML IV BOLUS
INTRAVENOUS | Status: DC | PRN
  Administered 2020-05-23: 150 mg via INTRAVENOUS

## 2020-05-23 MED ORDER — PROPOFOL 10 MG/ML IV BOLUS
INTRAVENOUS | Status: AC
  Filled 2020-05-23: qty 20

## 2020-05-23 MED ORDER — CHLORHEXIDINE GLUCONATE 0.12 % MT SOLN
OROMUCOSAL | Status: AC
  Administered 2020-05-23: 15 mL via OROMUCOSAL
  Filled 2020-05-23: qty 15

## 2020-05-23 MED ORDER — GLYCOPYRROLATE 0.2 MG/ML IJ SOLN
INTRAMUSCULAR | Status: AC
  Filled 2020-05-23: qty 1

## 2020-05-23 MED ORDER — SODIUM CHLORIDE 0.9 % IV SOLN
INTRAVENOUS | Status: DC | PRN
  Administered 2020-05-23: 15 ug/min via INTRAVENOUS

## 2020-05-23 MED ORDER — SCOPOLAMINE 1 MG/3DAYS TD PT72
MEDICATED_PATCH | TRANSDERMAL | Status: AC
  Filled 2020-05-23: qty 1

## 2020-05-23 MED ORDER — KETAMINE HCL 50 MG/5ML IJ SOSY
PREFILLED_SYRINGE | INTRAMUSCULAR | Status: AC
  Filled 2020-05-23: qty 5

## 2020-05-23 MED ORDER — ONDANSETRON HCL 4 MG/2ML IJ SOLN
INTRAMUSCULAR | Status: DC | PRN
  Administered 2020-05-23: 4 mg via INTRAVENOUS

## 2020-05-23 MED ORDER — ENOXAPARIN SODIUM 40 MG/0.4ML ~~LOC~~ SOLN
40.0000 mg | SUBCUTANEOUS | Status: DC
  Administered 2020-05-24: 40 mg via SUBCUTANEOUS
  Filled 2020-05-23: qty 0.4

## 2020-05-23 MED ORDER — DEXAMETHASONE SODIUM PHOSPHATE 10 MG/ML IJ SOLN
INTRAMUSCULAR | Status: AC
  Filled 2020-05-23: qty 1

## 2020-05-23 MED ORDER — CEFAZOLIN SODIUM-DEXTROSE 2-4 GM/100ML-% IV SOLN
INTRAVENOUS | Status: AC
  Filled 2020-05-23: qty 100

## 2020-05-23 MED ORDER — PROCHLORPERAZINE MALEATE 10 MG PO TABS
10.0000 mg | ORAL_TABLET | Freq: Four times a day (QID) | ORAL | Status: DC | PRN
  Filled 2020-05-23: qty 1

## 2020-05-23 SURGICAL SUPPLY — 63 items
ADH SKN CLS APL DERMABOND .7 (GAUZE/BANDAGES/DRESSINGS) ×1
AGENT HMST MTR 8 SURGIFLO (HEMOSTASIS) ×1
APL ESCP 34 STRL LF DISP (HEMOSTASIS) ×1
APL PRP STRL LF DISP 70% ISPRP (MISCELLANEOUS) ×1
APPLICATOR SURGIFLO ENDO (HEMOSTASIS) ×1 IMPLANT
CANISTER SUCT 1200ML W/VALVE (MISCELLANEOUS) ×1 IMPLANT
CANNULA REDUC XI 12-8 STAPL (CANNULA) ×2
CANNULA REDUCER 12-8 DVNC XI (CANNULA) ×1 IMPLANT
CHLORAPREP W/TINT 26 (MISCELLANEOUS) ×2 IMPLANT
COVER WAND RF STERILE (DRAPES) ×2 IMPLANT
DECANTER SPIKE VIAL GLASS SM (MISCELLANEOUS) ×2 IMPLANT
DEFOGGER SCOPE WARMER CLEARIFY (MISCELLANEOUS) ×2 IMPLANT
DERMABOND ADVANCED (GAUZE/BANDAGES/DRESSINGS) ×1
DERMABOND ADVANCED .7 DNX12 (GAUZE/BANDAGES/DRESSINGS) ×1 IMPLANT
DRAPE 3/4 80X56 (DRAPES) ×2 IMPLANT
DRAPE ARM DVNC X/XI (DISPOSABLE) ×4 IMPLANT
DRAPE COLUMN DVNC XI (DISPOSABLE) ×1 IMPLANT
DRAPE DA VINCI XI ARM (DISPOSABLE) ×8
DRAPE DA VINCI XI COLUMN (DISPOSABLE) ×2
ELECT CAUTERY BLADE 6.4 (BLADE) ×2 IMPLANT
ELECT REM PT RETURN 9FT ADLT (ELECTROSURGICAL) ×2
ELECTRODE REM PT RTRN 9FT ADLT (ELECTROSURGICAL) ×1 IMPLANT
GLOVE SURG ENC MOIS LTX SZ7 (GLOVE) ×6 IMPLANT
GOWN STRL REUS W/ TWL LRG LVL3 (GOWN DISPOSABLE) ×4 IMPLANT
GOWN STRL REUS W/TWL LRG LVL3 (GOWN DISPOSABLE) ×8
GRASPER LAPSCPC 5X45 DSP (INSTRUMENTS) ×2 IMPLANT
IRRIGATION STRYKERFLOW (MISCELLANEOUS) IMPLANT
IRRIGATOR STRYKERFLOW (MISCELLANEOUS)
IV NS 1000ML (IV SOLUTION)
IV NS 1000ML BAXH (IV SOLUTION) IMPLANT
KIT PINK PAD W/HEAD ARE REST (MISCELLANEOUS) ×2
KIT PINK PAD W/HEAD ARM REST (MISCELLANEOUS) ×1 IMPLANT
KIT TURNOVER CYSTO (KITS) ×2 IMPLANT
LABEL OR SOLS (LABEL) ×2 IMPLANT
MANIFOLD NEPTUNE II (INSTRUMENTS) ×2 IMPLANT
MESH BIO-A 7X10 SYN MAT (Mesh General) ×1 IMPLANT
NEEDLE HYPO 22GX1.5 SAFETY (NEEDLE) ×2 IMPLANT
OBTURATOR OPTICAL STANDARD 8MM (TROCAR) ×2
OBTURATOR OPTICAL STND 8 DVNC (TROCAR) ×1
OBTURATOR OPTICALSTD 8 DVNC (TROCAR) ×1 IMPLANT
PACK LAP CHOLECYSTECTOMY (MISCELLANEOUS) ×2 IMPLANT
PENCIL ELECTRO HAND CTR (MISCELLANEOUS) ×2 IMPLANT
SEAL CANN UNIV 5-8 DVNC XI (MISCELLANEOUS) ×3 IMPLANT
SEAL XI 5MM-8MM UNIVERSAL (MISCELLANEOUS) ×6
SEALER VESSEL DA VINCI XI (MISCELLANEOUS) ×2
SEALER VESSEL EXT DVNC XI (MISCELLANEOUS) ×1 IMPLANT
SOLUTION ELECTROLUBE (MISCELLANEOUS) ×2 IMPLANT
SPOGE SURGIFLO 8M (HEMOSTASIS) ×2
SPONGE LAP 18X18 RF (DISPOSABLE) ×2 IMPLANT
SPONGE SURGIFLO 8M (HEMOSTASIS) IMPLANT
STAPLER CANNULA SEAL DVNC XI (STAPLE) ×1 IMPLANT
STAPLER CANNULA SEAL XI (STAPLE) ×2
SUT MNCRL 4-0 (SUTURE) ×4
SUT MNCRL 4-0 27XMFL (SUTURE) ×2
SUT SILK 2 0 SH (SUTURE) ×4 IMPLANT
SUT VICRYL 0 AB UR-6 (SUTURE) ×4 IMPLANT
SUT VLOC 90 S/L VL9 GS22 (SUTURE) ×2 IMPLANT
SUTURE MNCRL 4-0 27XMF (SUTURE) ×1 IMPLANT
TAPE TRANSPORE STRL 2 31045 (GAUZE/BANDAGES/DRESSINGS) ×2 IMPLANT
TRAY FOLEY SLVR 16FR LF STAT (SET/KITS/TRAYS/PACK) ×2 IMPLANT
TROCAR BALLN GELPORT 12X130M (ENDOMECHANICALS) ×2 IMPLANT
TROCAR XCEL NON-BLD 5MMX100MML (ENDOMECHANICALS) ×2 IMPLANT
TUBING EVAC SMOKE HEATED PNEUM (TUBING) ×2 IMPLANT

## 2020-05-23 NOTE — Anesthesia Postprocedure Evaluation (Signed)
Anesthesia Post Note  Patient: Elizabeth Crawford  Procedure(s) Performed: XI ROBOTIC ASSISTED HIATAL HERNIA REPAIR with Adrianne Allred, RNFA to assist (N/A )  Patient location during evaluation: PACU Anesthesia Type: General Level of consciousness: awake and alert Pain management: pain level controlled Vital Signs Assessment: post-procedure vital signs reviewed and stable Respiratory status: spontaneous breathing and respiratory function stable Cardiovascular status: stable Anesthetic complications: no   No complications documented.   Last Vitals:  Vitals:   05/23/20 1643 05/23/20 1645  BP:  123/64  Pulse: 84 78  Resp: (!) 21 (!) 25  Temp:    SpO2: 97% 95%    Last Pain:  Vitals:   05/23/20 1645  TempSrc:   PainSc: 0-No pain                 Hulan Szumski K

## 2020-05-23 NOTE — H&P (Signed)
HPI: 75 year old female well-known to me with a history of paraesophageal hernia repair8/2020. sheDid very well initially and then last couple months continue to have regurgitation. SheHad a prior work-up to include barium swallow that at that time the wrap was intact. We repeated that study last week and this showed evidence of a slipped fundoplication and likely recurred paraesophageal hernia. She continues to have some regurgitation and reflux. She now started to have cough. No fevers no chills she is able to swallow. He is otherwise doing well and her pulmonary status still good. Have shared pictures with them and show them the barium swallow. \Most recent repeated CT shows evidence of small recurrent paraesophageal hernia.  There is no evidence of incarceration or strangulation. SHe continues to have nausea and some intermittent emesis. She is here preop for redo paraesophageal hernia.       Past Medical History:  Diagnosis Date  . Allergy   . Arthritis   . Complication of anesthesia   . DVT (deep venous thrombosis) ([place])   . GERD (gastroesophageal reflux disease)   . H/O seasonal allergies   . PONV (postoperative nausea and vomiting)    Gets sick every time  . Pulmonary embolism ([place])   . Sleep apnea   . Spondylolisthesis          Past Surgical History:  Procedure Laterality Date  . BACK SURGERY     x2  . BLADDER SUSPENSION  2009  . BREAST REDUCTION SURGERY Bilateral 03/01/2019   Procedure: BILATERAL BREAST REDUCTION;  Surgeon: Wallace Going, DO;  Location: [place];  Service: Plastics;  Laterality: Bilateral;  total case 4 hours  . CARPAL TUNNEL RELEASE  2003   Left hand  . CHOLECYSTECTOMY  2000   Laparoscopic  . COLONOSCOPY W/ POLYPECTOMY  2014  . COLONOSCOPY WITH PROPOFOL N/A 05/22/2017   Procedure: COLONOSCOPY WITH PROPOFOL;  Surgeon: Lollie Sails, MD;  Location: [facility] ENDOSCOPY;  Service: Endoscopy;  Laterality: N/A;  .  DECOMPRESSIVE LUMBAR LAMINECTOMY LEVEL 2 N/A 11/09/2015   Procedure: DECOMPRESSION L2-5 REMOVAL PEDICULE SCREWS L4-5;  Surgeon: Melina Schools, MD;  Location: [place];  Service: Orthopedics;  Laterality: N/A;  . ESOPHAGOGASTRODUODENOSCOPY (EGD) WITH PROPOFOL N/A 10/09/2018   Procedure: ESOPHAGOGASTRODUODENOSCOPY (EGD) WITH PROPOFOL;  Surgeon: Lollie Sails, MD;  Location[facility] END[facility]COPY;  Service: Endoscopy;  Laterality: N/A;  . EYE SURGERY Bilateral 2005   cataract surgery   . HARDWARE REMOVAL N/A 11/09/2015   Procedure: HARDWARE REMOVAL;  Surgeon: Melina Schools, MD;  Location: [place];  Service: Orthopedics;  Laterality: N/A;  . [place]  . IVC filter placement  02-01-10  . REDUCTION MAMMAPLASTY Bilateral 03/01/2019  . SPINE SURGERY  5 23-12   anterolateral fusion/ rodding  . TONSILLECTOMY    . TUBAL LIGATION  1977         Family History  Problem Relation Age of Onset  . COPD Mother   . Heart disease Mother   . Aortic aneurysm Mother   . Heart disease Father     Social History:  reports that she quit smoking about 44 years ago. Her smoking use included cigarettes. She quit after 15.00 years of use. She has never used smokeless tobacco. She reports current alcohol use. She reports that she does not use drugs.  Allergies:       Allergies  Allergen Reactions  . Lidocaine Other (See Comments)    UTI (polyuria, dysuria) and facial flushing  . Tape  Paper and adhesive tape make blisters and redness and difficult to remove even on the first day  . Elemental Sulfur Nausea And Vomiting  . Methylprednisolone Palpitations  . Sulfasalazine Nausea Only    Medications reviewed.    ROS Full ROS performed and is otherwise negative other than what is stated in HPI    Physical Exam Vitals and nursing note reviewed. Exam conducted with a chaperone present.  Constitutional:      General: She is not in acute distress.    Appearance:  Normal appearance. She is normal weight.  Cardiovascular:     Rate and Rhythm: Normal rate and regular rhythm.     Heart sounds: No murmur heard.   Pulmonary:     Effort: Pulmonary effort is normal. No respiratory distress.     Breath sounds: Normal breath sounds.  Musculoskeletal:     Cervical back: Normal range of motion and neck supple. No rigidity or tenderness.  Skin:    General: Skin is warm and dry.     Capillary Refill: Capillary refill takes less than 2 seconds.  Neurological:     General: No focal deficit present.     Mental Status: She is alert and oriented to person, place, and time.  Psychiatric:        Mood and Affect: Mood normal.        Behavior: Behavior normal.        Thought Content: Thought content normal.        Judgment: Judgment normal.     Assessment/Plan: 75 year old female with recurrent paraesophageal hernia this time the recurrence is mild but there is evidence of slipped Nissen, and  more importantly patient has recurrent symptoms. I  Discussed with patient in detail about her disease process.  I personally showed her all the imaging studies.  She will undergo another upper endoscopy in the next week or so.  I do think that she will need revision of the  fundoplication and repair of paraesophageal hernia.  Cussed with her in detail that at this time I will be using mesh and bIo-a to reinforce the diaphragm. Procedure discussed with patient detail.  Risks, benefits,   possible COMPLICATIONS including but not limited to: Bleeding,esophageal injuries, dysphagia, bloating, recurrence  She wishes to proceed.   Caroleen Hamman, MD [facility] General Surgeon

## 2020-05-23 NOTE — Op Note (Signed)
Robotic assisted laparoscopic repair of  recurrent Paraesophageal hernia with Bio A Mesh and  Nissen Fundoplication  Pre-operative Diagnosis: GERD, Type III recurrent paraesophageal hernial hernia  Post-operative Diagnosis: same  Procedure:  Robotic assisted laparoscopic fundoplication  repair of recurrent paraesophageal  hernia using Bio-A mesh  Surgeon: Caroleen Hamman, MD FACS  Assistant: Mr. Olean Ree. PA-C Required due to the complexity of the case the need for exposure and lack of first assist.  Anesthesia: Gen. with endotracheal tube  Findings: Type III recurrent paraesophageal hernial hernia, large hernia with  at least 1/2 of the stomach within mediastinum with chronic changes and significant mediastinal adhesions due to its chronicity and recurrence. Disrupted prior fundoplication Loose wrap 397 N degree over 50 FR Bougie Intra-abdominal GE junction No evidence of esophageal or bowel injuries   Estimated Blood Loss: 15cc             Complications: none   Procedure Details  The patient was seen again in the Holding Room. The benefits, complications, treatment options, and expected outcomes were discussed with the patient. The risks of bleeding, infection, recurrence of symptoms, failure to resolve symptoms,  esophageal damage, Dysphagia, bowel injury, any of which could require further surgery were reviewed with the patient. The likelihood of improving the patient's symptoms with return to their baseline status is good.  The patient and/or family concurred with the proposed plan, giving informed consent.  The patient was taken to Operating Room, identified  and the procedure verified.  A Time Out was held and the above information confirmed.  Prior to the induction of general anesthesia, antibiotic prophylaxis was administered. VTE prophylaxis was in place. General endotracheal anesthesia was then administered and tolerated well. After the induction, the abdomen was  prepped with Chloraprep and draped in the sterile fashion. The patient was positioned in the supine position.  Cut down technique was used to enter the abdominal cavity and a Hasson trochar was placed after two vicryl stitches were anchored to the fascia. Pneumoperitoneum was then created with CO2 and tolerated well without any adverse changes in the patient's vital signs.  Three 8-mm ports were placed under direct vision. All skin incisions  were infiltrated with a local anesthetic agent before making the incision and placing the trocars. An additional 5 mm regular laparoscopic port was placed to assist with retraction and exposure.   The patient was positioned  in reverse Trendelenburg, robot was brought to the surgical field and docked in the standard fashion.  We made sure all the instrumentation was kept indirect view at all times and that there were no collision between the arms. I scrubbed out and went to the console.  I used a robotic arm to retract the liver, the vessel sealer on my right hand and a forced bipolar grasper on my left hand.  There is along the extra 5 mm port allow me ample exposure and the ability to perform meticulous dissection  We Started dividing the lesser omentum via the pars flaccida.  We Were able to dissect the lesser curvature of the stomach and  dissected the fundus free from the right and left crus.  We circumferentially dissected the GE junction. Large recurrent hernia observed with disrupted prior fundoplication. The hernia sac was also completely reduced and we were able to bring the stomach into the intra-abdominal position.  The stomach had significant adhesions to the mediastinum but using meticulous dissection we were able to free up the stomach from surrounding mediastinal structures. No evidence  of vascular or mediastinal injuries were observed. We were able to identify the left crus and again were able to make sure there was a good circumferential  dissection and that the hernia sac was completely excised.  We did perform also a good dissection within the mediastinum to allow a complete reduction of the sac, to have plenty of esophageal length and  to completely allow an intra-abdominal fundoplication.  2-0V lock suture was inserted and the crus as well as the hernia was closed with a running suture using bio a strip as a pledget suture. A hiatal hernia Bio-A mesh was placed posterior to the esophagus and attached to the right and left crus of the esophagus using surgifoam.   We Asked anesthesia to place a 50 French bougie and this went easily.  We also observed the trajectory of the bougie. 010 degree  fundoplication was created with multiple 2-0 silk sutures and we placed 3 stitches taking some of the esophagus within that bite.  The fundoplication measured approximately 4-1/2 cm . We also secure the fundus of the stomach to the left and right crus respectively. I was very happy with the way the fundoplication laid and the repair of the hernia.  Inspection of the  upper quadrant was performed. No bleeding, bile  Or esophageal injuries leaks, or bowel injuries were noted. Robotic instruments and robotic arms were undocked in the standard fashion. All the needles were removed under direct visualization.   I scrubbed back in.  Pneumoperitoneum was released.  The periumbilical port site was closed with interrumpted 0 Vicryl sutures. 4-0 subcuticular Monocryl was used to close the skin. Liposomal marcaine was injected to all the incisions sites.  Dermabond was  applied.  The patient was then extubated and brought to the recovery room in stable condition. Sponge, lap, and needle counts were correct at closure and at the conclusion of the case.               Caroleen Hamman, MD, FACS

## 2020-05-23 NOTE — Addendum Note (Signed)
Addendum  created 05/23/20 1700 by Gunnar Fusi, MD   Attestation recorded in [place], [place] filed

## 2020-05-23 NOTE — Anesthesia Procedure Notes (Signed)
Procedure Name: Intubation Date/Time: 05/23/2020 1:01 PM Performed by: Tollie Eth, CRNA Pre-anesthesia Checklist: Patient identified, Patient being monitored, Timeout performed, Emergency Drugs available and Suction available Patient Re-evaluated:Patient Re-evaluated prior to induction Oxygen Delivery Method: Circle system utilized Preoxygenation: Pre-oxygenation with 100% oxygen Induction Type: IV induction Ventilation: Mask ventilation without difficulty Laryngoscope Size: 3 and McGraph Grade View: Grade I Tube type: Oral Tube size: 7.0 mm Number of attempts: 1 Airway Equipment and Method: Stylet Placement Confirmation: ETT inserted through vocal cords under direct vision,  positive ETCO2 and breath sounds checked- equal and bilateral Secured at: 21 cm Tube secured with: Tape Dental Injury: Teeth and Oropharynx as per pre-operative assessment

## 2020-05-23 NOTE — Transfer of Care (Signed)
Immediate Anesthesia Transfer of Care Note  Patient: Elizabeth Crawford  Procedure(s) Performed: XI ROBOTIC ASSISTED HIATAL HERNIA REPAIR with Adrianne Allred, RNFA to assist (N/A )  Patient Location: PACU  Anesthesia Type:General  Level of Consciousness: drowsy  Airway & Oxygen Therapy: Patient Spontanous Breathing and Patient connected to face mask oxygen  Post-op Assessment: Report given to RN and Post -op Vital signs reviewed and stable  Post vital signs: Reviewed and stable  Last Vitals:  Vitals Value Taken Time  BP 127/66 05/23/20 1624  Temp    Pulse 78 05/23/20 1628  Resp 21 05/23/20 1628  SpO2 98 % 05/23/20 1628  Vitals shown include unvalidated device data.  Last Pain:  Vitals:   05/23/20 1008  TempSrc: Temporal  PainSc: 0-No pain         Complications: No complications documented.

## 2020-05-23 NOTE — Anesthesia Preprocedure Evaluation (Signed)
Anesthesia Evaluation  Patient identified by MRN, date of birth, ID band Patient awake    Reviewed: Allergy & Precautions, H&P , NPO status , Patient's Chart, lab work & pertinent test results  History of Anesthesia Complications (+) PONV and history of anesthetic complications  Airway Mallampati: II  TM Distance: >3 FB Neck ROM: limited    Dental  (+) Chipped   Pulmonary neg shortness of breath, sleep apnea and Continuous Positive Airway Pressure Ventilation , former smoker, PE   Pulmonary exam normal        Cardiovascular Exercise Tolerance: Good (-) angina(-) Past MI and (-) DOE negative cardio ROS Normal cardiovascular exam     Neuro/Psych negative neurological ROS  negative psych ROS   GI/Hepatic Neg liver ROS, GERD  Poorly Controlled,  Endo/Other  negative endocrine ROS  Renal/GU negative Renal ROS  negative genitourinary   Musculoskeletal  (+) Arthritis ,   Abdominal   Peds negative pediatric ROS (+)  Hematology negative hematology ROS (+)   Anesthesia Other Findings Past Medical History: No date: Allergy No date: Arthritis No date: Complication of anesthesia No date: DVT (deep venous thrombosis) (HCC) No date: GERD (gastroesophageal reflux disease) No date: H/O seasonal allergies No date: PONV (postoperative nausea and vomiting)     Comment:  Gets sick every time No date: Pulmonary embolism ([place]) No date: Sleep apnea No date: Spondylolisthesis  Past Surgical History: 2009: BLADDER SUSPENSION 2003: CARPAL TUNNEL RELEASE     Comment:  Left hand 2000: CHOLECYSTECTOMY     Comment:  Laparoscopic 2014: COLONOSCOPY W/ POLYPECTOMY 05/22/2017: COLONOSCOPY WITH PROPOFOL; N/A     Comment:  Procedure: COLONOSCOPY WITH PROPOFOL;  Surgeon:               Lollie Sails, MD;  Location: [facility];                Service: Endoscopy;  Laterality: N/A; 11/09/2015: DECOMPRESSIVE LUMBAR LAMINECTOMY LEVEL  2; N/A     Comment:  Procedure: DECOMPRESSION L2-5 REMOVAL PEDICULE SCREWS               L4-5;  Surgeon: Melina Schools, MD;  Location: [place];                Service: Orthopedics;  Laterality: N/A; 2005: EYE SURGERY; Bilateral     Comment:  cataract surgery  11/09/2015: HARDWARE REMOVAL; N/A     Comment:  Procedure: HARDWARE REMOVAL;  Surgeon: Melina Schools,               MD;  Location: [place];  Service: Orthopedics;  Laterality:              N/A; 1978: HEMORRHOID SURGERY 02-01-10: IVC filter placement 5 23-12: SPINE SURGERY     Comment:  anterolateral fusion/ rodding No date: TONSILLECTOMY 1977: TUBAL LIGATION  BMI    Body Mass Index: 34.36 kg/m      Reproductive/Obstetrics negative OB ROS                             Anesthesia Physical  Anesthesia Plan  ASA: III  Anesthesia Plan: General   Post-op Pain Management:    Induction: Intravenous  PONV Risk Score and Plan:   Airway Management Planned: Oral ETT  Additional Equipment:   Intra-op Plan:   Post-operative Plan: Extubation in OR  Informed Consent: I have reviewed the patients History and Physical, chart, labs and discussed the procedure  including the risks, benefits and alternatives for the proposed anesthesia with the patient or authorized representative who has indicated his/her understanding and acceptance.     Dental Advisory Given  Plan Discussed with: Anesthesiologist, CRNA and Surgeon  Anesthesia Plan Comments: (Patient consented for risks of anesthesia including but not limited to:  - adverse reactions to medications - risk of intubation if required - damage to teeth, lips or other oral mucosa - sore throat or hoarseness - Damage to heart, brain, lungs or loss of life  Patient voiced understanding.)        Anesthesia Quick Evaluation

## 2020-05-24 ENCOUNTER — Encounter: Payer: Self-pay | Admitting: Surgery

## 2020-05-24 ENCOUNTER — Inpatient Hospital Stay: Payer: Medicare HMO

## 2020-05-24 LAB — BASIC METABOLIC PANEL
Anion gap: 5 (ref 5–15)
BUN: 14 mg/dL (ref 8–23)
CO2: 23 mmol/L (ref 22–32)
Calcium: 8.7 mg/dL — ABNORMAL LOW (ref 8.9–10.3)
Chloride: 107 mmol/L (ref 98–111)
Creatinine, Ser: 0.85 mg/dL (ref 0.44–1.00)
GFR, Estimated: >60 mL/min (ref >60)
Glucose, Bld: 141 mg/dL — ABNORMAL HIGH (ref 70–99)
Potassium: 3.9 mmol/L (ref 3.5–5.1)
Sodium: 135 mmol/L (ref 135–145)

## 2020-05-24 LAB — CBC
HCT: 33.9 % — ABNORMAL LOW (ref 36.0–46.0)
Hemoglobin: 11.4 g/dL — ABNORMAL LOW (ref 12.0–15.0)
MCH: 31.4 pg (ref 26.0–34.0)
MCHC: 33.6 g/dL (ref 30.0–36.0)
MCV: 93.4 fL (ref 80.0–100.0)
Platelets: 125 10*3/uL — ABNORMAL LOW (ref 150–400)
RBC: 3.63 MIL/uL — ABNORMAL LOW (ref 3.87–5.11)
RDW: 12.5 % (ref 11.5–15.5)
WBC: 8.1 10*3/uL (ref 4.0–10.5)
nRBC: 0 % (ref 0.0–0.2)

## 2020-05-24 MED ORDER — OXYCODONE HCL 5 MG PO TABS: 5.0000 mg | ORAL_TABLET | Freq: Four times a day (QID) | ORAL | 0 refills | Status: DC | PRN

## 2020-05-24 MED ORDER — IOHEXOL 300 MG/ML  SOLN
50.0000 mL | Freq: Once | INTRAMUSCULAR | Status: AC | PRN
  Administered 2020-05-24: 50 mL via ORAL

## 2020-05-24 NOTE — Discharge Summary (Signed)
[facility] SURGICAL DISCHARGE SUMMARY  Patient ID: ELENI FRANK MRN: [id number] DOB/AGE: February 10, 1946 75 y.o.  Admit date: 05/23/2020 Discharge date: 05/24/2020  Discharge Diagnoses Patient Active Problem List   Diagnosis Date Noted  . S/P repair of paraesophageal hernia 10/27/2018    Consultants None  Procedures 05/23/2020:  Robotic assisted laparoscopic repair of recurrent paraesophageal hernia with nissen fundoplication   HPI: KARLI WICKIZER is a 75 y.o. female with a history of recurrent hiatal hernia following initial repair in August of 2020 who presents to [facility] on 03/01 for scheduled repair with Dr Dahlia Byes.   Hospital Course: Informed consent was obtained and documented, and patient underwent uneventful Robotic assisted laparoscopic repair of recurrent paraesophageal hernia with nissen fundoplication  (Dr Dahlia Byes, 05/23/2020).  Post-operatively, patient did well and advancement of patient's diet and ambulation were well-tolerated. The remainder of patient's hospital course was essentially unremarkable, and discharge planning was initiated accordingly with patient safely able to be discharged home with appropriate discharge instructions, pain control, and outpatient follow-up after all of her questions were answered to her expressed satisfaction.   Discharge Condition: Good   Physical Examination:  Constitutional: Well appearing female, NAD Pulmonary: Normal effort, no respiratory distress Gastrointestinal: Soft, incisional soreness, non-distended, no rebound/guarding Skin: Laparoscopic incisions are CDI with dermabond, no erythema or drainage    Allergies as of 05/24/2020      Reactions   Lidocaine Other (See Comments)   UTI (polyuria, dysuria) and facial flushing   Tape    Paper and adhesive tape make blisters and redness and difficult to remove even on the first day   Elemental Sulfur Nausea And Vomiting   Methylprednisolone Palpitations    Sulfasalazine Nausea Only      Medication List    STOP taking these medications   omeprazole 40 MG capsule Commonly known as: PRILOSEC     TAKE these medications   aspirin 81 MG EC tablet Take 81 mg by mouth at bedtime.   cetirizine 10 MG tablet Commonly known as: ZYRTEC Take 10 mg by mouth daily.   clotrimazole-betamethasone cream Commonly known as: LOTRISONE Apply 1 application topically daily as needed (itching with skin folds).   fluconazole 200 MG tablet Commonly known as: Diflucan Take 2 tabs (400 mg) once on day one; then take 1 tab (200 mg) daily for 13 days   hydroxypropyl methylcellulose / hypromellose 2.5 % ophthalmic solution Commonly known as: ISOPTO TEARS / GONIOVISC Place 1 drop into both eyes daily as needed for dry eyes.   ibuprofen 200 MG tablet Commonly known as: ADVIL Take 400 mg by mouth every 8 (eight) hours as needed (pain).   oxyCODONE 5 MG immediate release tablet Commonly known as: Oxy IR/ROXICODONE Take 1 tablet (5 mg total) by mouth every 6 (six) hours as needed for severe pain or breakthrough pain.   rosuvastatin 5 MG tablet Commonly known as: CRESTOR Take 5 mg by mouth at bedtime.         Follow-up Information    Pabon, [place] F, MD. Schedule an appointment as soon as possible for a visit in 2 week(s).   Specialty: General Surgery Why: follow up paraesophageal hernia repair  Contact information: [address] [place] [address] [phone]                Time spent on discharge management including discussion of hospital course, clinical condition, outpatient instructions, prescriptions, and follow up with the patient and members of the medical team: >30  minutes  -- Edison Simon , PA-C [facility]  05/24/2020, 1:31 PM [phone] M-F: 7am - 4pm

## 2020-05-24 NOTE — Discharge Instructions (Signed)
In addition to included general post-operative instructions for laparoscopic paraesophageal hernia repair,  Diet: Follow Nissen diet plan x4 weeks; avoid carbonation, uncooked breads, etc  Activity: No heavy lifting >20 pounds (children, pets, laundry, garbage) for 4 weeks, but light activity and walking are encouraged. Do not drive or drink alcohol if taking narcotic pain medications or having pain that might distract from driving.  Wound care: 2 days after surgery (03/03), you may shower/get incision wet with soapy water and pat dry (do not rub incisions), but no baths or submerging incision underwater until follow-up.   Medications: Resume all home medications. For mild to moderate pain: acetaminophen (Tylenol) or ibuprofen/naproxen (if no kidney disease). Combining Tylenol with alcohol can substantially increase your risk of causing liver disease. Narcotic pain medications, if prescribed, can be used for severe pain, though may cause nausea, constipation, and drowsiness. Do not combine Tylenol and Percocet (or similar) within a 6 hour period as Percocet (and similar) contain(s) Tylenol. If you do not need the narcotic pain medication, you do not need to fill the prescription.  Call office [phone] / [phone]) at any time if any questions, worsening pain, fevers/chills, bleeding, drainage from incision site, or other concerns.

## 2020-05-24 NOTE — Progress Notes (Signed)
Pt will be discharging home. Husband at bedside.

## 2020-06-05 ENCOUNTER — Encounter: Payer: Self-pay | Admitting: Surgery

## 2020-06-05 ENCOUNTER — Ambulatory Visit (INDEPENDENT_AMBULATORY_CARE_PROVIDER_SITE_OTHER): Payer: Medicare HMO | Admitting: Surgery

## 2020-06-05 ENCOUNTER — Other Ambulatory Visit: Payer: Self-pay

## 2020-06-05 VITALS — BP 120/78 | HR 86 | Temp 98.4°F | Ht 64.0 in | Wt 168.8 lb

## 2020-06-05 DIAGNOSIS — Z09 Encounter for follow-up examination after completed treatment for conditions other than malignant neoplasm: Secondary | ICD-10-CM

## 2020-06-05 NOTE — Patient Instructions (Signed)
If you have any concerns or questions, please feel free to call our office. See follow up appointment below. 

## 2020-06-05 NOTE — Progress Notes (Signed)
S/p redo paraesophageal hernia repair w mesh + Nissen Is doing very well.  No pain.  She is able to swallow well.  She states that her cough has completely resolved. SHe also reports that she has had the best bowel movements in more than 40 years Some nausea but is controlled w Zofran She tried to have pepsi... I reiterated about diet restrictions following H/H repair    PE NAD Abd: soft, nt , incisions c/d/i. No infection or peritonitis  A/ p Doing very well RTC 2-3 months Continue post nissen diet

## 2020-06-06 DIAGNOSIS — G4733 Obstructive sleep apnea (adult) (pediatric): Secondary | ICD-10-CM | POA: Diagnosis not present

## 2020-06-27 DIAGNOSIS — M545 Low back pain, unspecified: Secondary | ICD-10-CM | POA: Diagnosis not present

## 2020-06-27 DIAGNOSIS — M9904 Segmental and somatic dysfunction of sacral region: Secondary | ICD-10-CM | POA: Diagnosis not present

## 2020-06-28 DIAGNOSIS — R053 Chronic cough: Secondary | ICD-10-CM | POA: Diagnosis not present

## 2020-06-29 DIAGNOSIS — Z01419 Encounter for gynecological examination (general) (routine) without abnormal findings: Secondary | ICD-10-CM | POA: Diagnosis not present

## 2020-07-05 DIAGNOSIS — M533 Sacrococcygeal disorders, not elsewhere classified: Secondary | ICD-10-CM | POA: Diagnosis not present

## 2020-07-07 DIAGNOSIS — G4733 Obstructive sleep apnea (adult) (pediatric): Secondary | ICD-10-CM | POA: Diagnosis not present

## 2020-07-14 DIAGNOSIS — Z0100 Encounter for examination of eyes and vision without abnormal findings: Secondary | ICD-10-CM | POA: Diagnosis not present

## 2020-07-18 DIAGNOSIS — M5459 Other low back pain: Secondary | ICD-10-CM | POA: Diagnosis not present

## 2020-07-25 DIAGNOSIS — M545 Low back pain, unspecified: Secondary | ICD-10-CM | POA: Diagnosis not present

## 2020-08-06 DIAGNOSIS — G4733 Obstructive sleep apnea (adult) (pediatric): Secondary | ICD-10-CM | POA: Diagnosis not present

## 2020-08-07 ENCOUNTER — Other Ambulatory Visit: Payer: Self-pay

## 2020-08-07 ENCOUNTER — Ambulatory Visit (INDEPENDENT_AMBULATORY_CARE_PROVIDER_SITE_OTHER): Payer: Medicare HMO | Admitting: Surgery

## 2020-08-07 ENCOUNTER — Encounter: Payer: Self-pay | Admitting: Surgery

## 2020-08-07 VITALS — BP 124/82 | HR 105 | Temp 98.5°F | Ht 64.0 in | Wt 169.0 lb

## 2020-08-07 DIAGNOSIS — Z09 Encounter for follow-up examination after completed treatment for conditions other than malignant neoplasm: Secondary | ICD-10-CM

## 2020-08-07 MED ORDER — ERYTHROMYCIN BASE 500 MG PO TABS: 250.0000 mg | ORAL_TABLET | Freq: Three times a day (TID) | ORAL | 1 refills | Status: AC

## 2020-08-07 NOTE — Patient Instructions (Addendum)
You can take Benadryl (1/2 tablet, 3 times daily, 30 minutes before meals) over the counter. If you have any concerns or questions, please feel free to call our office. See follow up appointment below.     Diarrhea, Adult Diarrhea is when you pass loose and watery poop (stool) often. Diarrhea can make you feel weak and cause you to lose water in your body (get dehydrated). Losing water in your body can cause you to:  Feel tired and thirsty.  Have a dry mouth.  Go pee (urinate) less often. Diarrhea often lasts 2-3 days. However, it can last longer if it is a sign of something more serious. It is important to treat your diarrhea as told by your doctor. Follow these instructions at home: Eating and drinking Follow these instructions as told by your doctor:  Take an ORS (oral rehydration solution). This is a drink that helps you replace fluids and minerals your body lost. It is sold at pharmacies and stores.  Drink plenty of fluids, such as: ? Water. ? Ice chips. ? Diluted fruit juice. ? Low-calorie sports drinks. ? Milk, if you want.  Avoid drinking fluids that have a lot of sugar or caffeine in them.  Eat bland, easy-to-digest foods in small amounts as you are able. These foods include: ? Bananas. ? Applesauce. ? Rice. ? Low-fat (lean) meats. ? Toast. ? Crackers.  Avoid alcohol.  Avoid spicy or fatty foods.      Medicines  Take over-the-counter and prescription medicines only as told by your doctor.  If you were prescribed an antibiotic medicine, take it as told by your doctor. Do not stop using the antibiotic even if you start to feel better. General instructions  Wash your hands often using soap and water. If soap and water are not available, use a hand sanitizer. Others in your home should wash their hands as well. Hands should be washed: ? After using the toilet or changing a diaper. ? Before preparing, cooking, or serving food. ? While caring for a sick  person. ? While visiting someone in a hospital.  Drink enough fluid to keep your pee (urine) pale yellow.  Rest at home while you get better.  Watch your condition for any changes.  Take a warm bath to help with any burning or pain from having diarrhea.  Keep all follow-up visits as told by your doctor. This is important.   Contact a doctor if:  You have a fever.  Your diarrhea gets worse.  You have new symptoms.  You cannot keep fluids down.  You feel light-headed or dizzy.  You have a headache.  You have muscle cramps. Get help right away if:  You have chest pain.  You feel very weak or you pass out (faint).  You have bloody or black poop or poop that looks like tar.  You have very bad pain, cramping, or bloating in your belly (abdomen).  You have trouble breathing or you are breathing very quickly.  Your heart is beating very quickly.  Your skin feels cold and clammy.  You feel confused.  You have signs of losing too much water in your body, such as: ? Dark pee, very little pee, or no pee. ? Cracked lips. ? Dry mouth. ? Sunken eyes. ? Sleepiness. ? Weakness. Summary  Diarrhea is when you pass loose and watery poop (stool) often.  Diarrhea can make you feel weak and cause you to lose water in your body (get dehydrated).  Take  an ORS (oral rehydration solution). This is a drink that is sold at pharmacies and stores.  Eat bland, easy-to-digest foods in small amounts as you are able.  Contact a doctor if your condition gets worse. Get help right away if you have signs that you have lost too much water in your body. This information is not intended to replace advice given to you by your health care provider. Make sure you discuss any questions you have with your health care provider. Document Revised: 08/15/2017 Document Reviewed: 08/15/2017 Elsevier Patient Education  2021 [place].   Constipation, Adult Constipation is when a person has  trouble pooping (having a bowel movement). When you have this condition, you may poop fewer than 3 times a week. Your poop (stool) may also be dry, hard, or bigger than normal. Follow these instructions at home: Eating and drinking  Eat foods that have a lot of fiber, such as: ? Fresh fruits and vegetables. ? Whole grains. ? Beans.  Eat less of foods that are low in fiber and high in fat and sugar, such as: ? [place] fries. ? Hamburgers. ? Cookies. ? Candy. ? Soda.  Drink enough fluid to keep your pee (urine) pale yellow.   General instructions  Exercise regularly or as told by your doctor. Try to do 150 minutes of exercise each week.  Go to the restroom when you feel like you need to poop. Do not hold it in.  Take over-the-counter and prescription medicines only as told by your doctor. These include any fiber supplements.  When you poop: ? Do deep breathing while relaxing your lower belly (abdomen). ? Relax your pelvic floor. The pelvic floor is a group of muscles that support the rectum, bladder, and intestines (as well as the uterus in women).  Watch your condition for any changes. Tell your doctor if you notice any.  Keep all follow-up visits as told by your doctor. This is important. Contact a doctor if:  You have pain that gets worse.  You have a fever.  You have not pooped for 4 days.  You vomit.  You are not hungry.  You lose weight.  You are bleeding from the opening of the butt (anus).  You have thin, pencil-like poop. Get help right away if:  You have a fever, and your symptoms suddenly get worse.  You leak poop or have blood in your poop.  Your belly feels hard or bigger than normal (bloated).  You have very bad belly pain.  You feel dizzy or you faint. Summary  Constipation is when a person poops fewer than 3 times a week, has trouble pooping, or has poop that is dry, hard, or bigger than normal.  Eat foods that have a lot of fiber.  Drink  enough fluid to keep your pee (urine) pale yellow.  Take over-the-counter and prescription medicines only as told by your doctor. These include any fiber supplements. This information is not intended to replace advice given to you by your health care provider. Make sure you discuss any questions you have with your health care provider. Document Revised: 01/27/2019 Document Reviewed: 01/27/2019 Elsevier Patient Education  2021 [place].     Nausea, Adult Nausea is feeling sick to your stomach or feeling that you are about to throw up (vomit). Feeling sick to your stomach is usually not serious, but it may be an early sign of a more serious medical problem. As you feel sicker to your stomach, you may throw  up. If you throw up, or if you are not able to drink enough fluids, there is a risk that you may lose too much water in your body (get dehydrated). If you lose too much water in your body, you may:  Feel tired.  Feel thirsty.  Have a dry mouth.  Have cracked lips.  Go pee (urinate) less often. Older adults and people who have other diseases or a weak body defense system (immune system) have a higher risk of losing too much water in the body. The main goals of treating this condition are:  To relieve your nausea.  To ensure your nausea occurs less often.  To prevent throwing up and losing too much fluid. Follow these instructions at home: Watch your symptoms for any changes. Tell your doctor about them. Follow these instructions as told by your doctor. Eating and drinking  Take an ORS (oral rehydration solution). This is a drink that is sold at pharmacies and stores.  Drink clear fluids in small amounts as you are able. These include: ? Water. ? Ice chips. ? Fruit juice that has water added (diluted fruit juice). ? Low-calorie sports drinks.  Eat bland, easy-to-digest foods in small amounts as you are able, such as: ? Bananas. ? Applesauce. ? Rice. ? Low-fat (lean)  meats. ? Toast. ? Crackers.  Avoid drinking fluids that have a lot of sugar or caffeine in them. This includes energy drinks, sports drinks, and soda.  Avoid alcohol.  Avoid spicy or fatty foods.      General instructions  Take over-the-counter and prescription medicines only as told by your doctor.  Rest at home while you get better.  Drink enough fluid to keep your pee (urine) pale yellow.  Take slow and deep breaths when you feel sick to your stomach.  Avoid food or things that have strong smells.  Wash your hands often with soap and water. If you cannot use soap and water, use hand sanitizer.  Make sure that all people in your home wash their hands well and often.  Keep all follow-up visits as told by your doctor. This is important. Contact a doctor if:  You feel sicker to your stomach.  You feel sick to your stomach for more than 2 days.  You throw up.  You are not able to drink fluids without throwing up.  You have new symptoms.  You have a fever.  You have a headache.  You have muscle cramps.  You have a rash.  You have pain while peeing.  You feel light-headed or dizzy. Get help right away if:  You have pain in your chest, neck, arm, or jaw.  You feel very weak or you pass out (faint).  You have throw up that is bright red or looks like coffee grounds.  You have bloody or black poop (stools) or poop that looks like tar.  You have a very bad headache, a stiff neck, or both.  You have very bad pain, cramping, or bloating in your belly (abdomen).  You have trouble breathing or you are breathing very quickly.  Your heart is beating very quickly.  Your skin feels cold and clammy.  You feel confused.  You have signs of losing too much water in your body, such as: ? Dark pee, very little pee, or no pee. ? Cracked lips. ? Dry mouth. ? Sunken eyes. ? Sleepiness. ? Weakness. These symptoms may be an emergency. Do not wait to see if the  symptoms  will go away. Get medical help right away. Call your local emergency services (911 in the [place] Do not drive yourself to the hospital. Summary  Nausea is feeling sick to your stomach or feeling that you are about to throw up (vomit).  If you throw up, or if you are not able to drink enough fluids, there is a risk that you may lose too much water in your body (get dehydrated).  Eat and drink what your doctor tells you. Take over-the-counter and prescription medicines only as told by your doctor.  Contact a doctor right away if your symptoms get worse or you have new symptoms.  Keep all follow-up visits as told by your doctor. This is important. This information is not intended to replace advice given to you by your health care provider. Make sure you discuss any questions you have with your health care provider. Document Revised: 02/09/2019 Document Reviewed: 08/19/2017 Elsevier Patient Education  2021 [employer].

## 2020-08-08 ENCOUNTER — Encounter: Payer: Self-pay | Admitting: Surgery

## 2020-08-08 NOTE — Progress Notes (Signed)
Takaya is a 75 year old female well-known to me 2-1/2 months out from a redo paraesophageal hernia repair. Last couple weeks have had recurrence of nausea and abdominal distention.  No fevers no chills. She uses Zofran as needed.  PE NAD Abd: soft, nt, incisions c/d/i  A/P SPECT she does have a significant degree of gastroparesis.  Discussed with her about diet modification and not doing high sugar drinks.  We will add erythromycin as well as Benadryl.  She is to continue Zofran.  She is nontoxic.  We will see her back in 2 to 3 weeks.  We may have to redo work-up.

## 2020-08-09 ENCOUNTER — Telehealth: Payer: Self-pay | Admitting: *Deleted

## 2020-08-09 NOTE — Telephone Encounter (Signed)
Patients husband Barbaraann Rondo called and went to pick up the patients prescription Erythromycin but its going to cost them close to 50.00. He wants to know is there anything else they can get that's cheaper. Please call and advise

## 2020-08-09 NOTE — Telephone Encounter (Signed)
Spoke with pt and she stated that she will go ahead and pick up Rx and see what happens.

## 2020-08-11 ENCOUNTER — Telehealth: Payer: Self-pay | Admitting: *Deleted

## 2020-08-11 ENCOUNTER — Telehealth: Payer: Self-pay

## 2020-08-11 NOTE — Telephone Encounter (Signed)
Patient called back and said that she did get the Erythromycin and she would be giving that a try this weekend along with the Zofran. I instructed her to try taking the Zofran 30 minutes before the Erythromycin so it would help with any side effects of the antibiotic. She also states that the Benadryl did not help her so she will not be using that.

## 2020-08-11 NOTE — Telephone Encounter (Signed)
Patient called and is feeling upset due to her nausea and diarrhea that is getting worse each day. She is taking Zofran for the nausea. She denies fever or chills. She wants a call back in regards to what she needs to do.

## 2020-08-11 NOTE — Telephone Encounter (Signed)
Spoke with pharmacy and they stated that they sent [place] a request for an alternative. I have not seen it. Pt wants the Rx now so the pharmacy is filling it.

## 2020-08-23 DIAGNOSIS — Z0100 Encounter for examination of eyes and vision without abnormal findings: Secondary | ICD-10-CM | POA: Diagnosis not present

## 2020-08-25 DIAGNOSIS — M545 Low back pain, unspecified: Secondary | ICD-10-CM | POA: Diagnosis not present

## 2020-09-01 DIAGNOSIS — M545 Low back pain, unspecified: Secondary | ICD-10-CM | POA: Diagnosis not present

## 2020-09-05 DIAGNOSIS — G4733 Obstructive sleep apnea (adult) (pediatric): Secondary | ICD-10-CM | POA: Diagnosis not present

## 2020-09-07 ENCOUNTER — Other Ambulatory Visit (INDEPENDENT_AMBULATORY_CARE_PROVIDER_SITE_OTHER): Payer: Self-pay | Admitting: Vascular Surgery

## 2020-09-07 DIAGNOSIS — Z86718 Personal history of other venous thrombosis and embolism: Secondary | ICD-10-CM

## 2020-09-07 DIAGNOSIS — I872 Venous insufficiency (chronic) (peripheral): Secondary | ICD-10-CM

## 2020-09-11 ENCOUNTER — Other Ambulatory Visit: Payer: Self-pay

## 2020-09-11 ENCOUNTER — Ambulatory Visit (INDEPENDENT_AMBULATORY_CARE_PROVIDER_SITE_OTHER): Payer: Medicare HMO

## 2020-09-11 ENCOUNTER — Ambulatory Visit (INDEPENDENT_AMBULATORY_CARE_PROVIDER_SITE_OTHER): Payer: Medicare HMO | Admitting: Vascular Surgery

## 2020-09-11 ENCOUNTER — Encounter (INDEPENDENT_AMBULATORY_CARE_PROVIDER_SITE_OTHER): Payer: Self-pay | Admitting: Vascular Surgery

## 2020-09-11 VITALS — BP 128/88 | HR 83 | Ht 64.0 in | Wt 162.0 lb

## 2020-09-11 DIAGNOSIS — D2230 Melanocytic nevi of unspecified part of face: Secondary | ICD-10-CM | POA: Insufficient documentation

## 2020-09-11 DIAGNOSIS — I872 Venous insufficiency (chronic) (peripheral): Secondary | ICD-10-CM

## 2020-09-11 DIAGNOSIS — L304 Erythema intertrigo: Secondary | ICD-10-CM | POA: Insufficient documentation

## 2020-09-11 DIAGNOSIS — Z86718 Personal history of other venous thrombosis and embolism: Secondary | ICD-10-CM

## 2020-09-11 DIAGNOSIS — K219 Gastro-esophageal reflux disease without esophagitis: Secondary | ICD-10-CM

## 2020-09-11 DIAGNOSIS — D1801 Hemangioma of skin and subcutaneous tissue: Secondary | ICD-10-CM | POA: Insufficient documentation

## 2020-09-11 DIAGNOSIS — L918 Other hypertrophic disorders of the skin: Secondary | ICD-10-CM | POA: Insufficient documentation

## 2020-09-11 DIAGNOSIS — E7849 Other hyperlipidemia: Secondary | ICD-10-CM | POA: Diagnosis not present

## 2020-09-11 DIAGNOSIS — L814 Other melanin hyperpigmentation: Secondary | ICD-10-CM | POA: Insufficient documentation

## 2020-09-11 DIAGNOSIS — D485 Neoplasm of uncertain behavior of skin: Secondary | ICD-10-CM | POA: Insufficient documentation

## 2020-09-11 NOTE — Progress Notes (Signed)
MRN : [id number]  Elizabeth Crawford is a 75 y.o. (Aug 03, 1945) female who presents with chief complaint of  Chief Complaint  Patient presents with   Follow-up    3yr IVC iliac   .  History of Present Illness:   The patient returns to the office for follow up regarding her past DVT.  DVT was identified at [facility] by Duplex ultrasound remotely.  The initial symptoms were pain and swelling in the lower extremity.   At that time she had a filter placed at that time. Several months later attempts at removing the filter were unsuccessful. Since then we have been monitoring the patency of the inferior vena cava.   The patient notes her legs have some pain with dependency and swell.  Symptoms are much better with elevation.  The patient notes minimal edema in the morning which steadily increases throughout the day.     The patient has not been using compression on a daily at this point.   The patient is status post Nissen fundoplication as well as breast reduction secondary to severe respiratory symptoms.  She did not have any problems with either surgery.  She notes that her symptoms are dramatically improved having completed these 2 procedures.   No SOB or pleuritic chest pains.  No cough or hemoptysis.   No blood per rectum or blood in any sputum.  No excessive bruising per the patient.    Duplex ultrasound of the inferior vena cava shows the filter is patent and normal flow within the cava    Current Meds  Medication Sig   aspirin 81 MG EC tablet Take 81 mg by mouth at bedtime.   atorvastatin (LIPITOR) 10 MG tablet 1 tablet   cetirizine (ZYRTEC) 10 MG tablet Take 10 mg by mouth daily.   clotrimazole-betamethasone (LOTRISONE) cream Apply 1 application topically daily as needed (itching with skin folds).    fluconazole (DIFLUCAN) 200 MG tablet Take 2 tabs (400 mg) once on day one; then take 1 tab (200 mg) daily for 13 days   hydroxypropyl methylcellulose / hypromellose (ISOPTO TEARS /  GONIOVISC) 2.5 % ophthalmic solution Place 1 drop into both eyes daily as needed for dry eyes.   hydroxypropyl methylcellulose / hypromellose (ISOPTO TEARS / GONIOVISC) 2.5 % ophthalmic solution Apply to eye.   ibuprofen (ADVIL) 200 MG tablet Take 400 mg by mouth every 8 (eight) hours as needed (pain).   omeprazole (PRILOSEC) 40 MG capsule 1 capsule   ondansetron (ZOFRAN-ODT) 4 MG disintegrating tablet Take 4 mg by mouth every 8 (eight) hours as needed for nausea or vomiting.   rosuvastatin (CRESTOR) 5 MG tablet Take 5 mg by mouth at bedtime.     Past Medical History:  Diagnosis Date   Allergy    Arthritis    Complication of anesthesia    DVT (deep venous thrombosis) (HCC)    GERD (gastroesophageal reflux disease)    H/O seasonal allergies    Hypercholesteremia    PONV (postoperative nausea and vomiting)    Gets sick every time   Pulmonary embolism ([place])    Sleep apnea    Spondylolisthesis     Past Surgical History:  Procedure Laterality Date   BACK SURGERY     x2   BLADDER SUSPENSION  2009   BREAST REDUCTION SURGERY Bilateral 03/01/2019   Procedure: BILATERAL BREAST REDUCTION;  Surgeon: Wallace Going, DO;  Location: [facility];  Service: Plastics;  Laterality: Bilateral;  total case 4 hours  CARPAL TUNNEL RELEASE  2003   Left hand   CHOLECYSTECTOMY  2000   Laparoscopic   COLONOSCOPY W/ POLYPECTOMY  2014   COLONOSCOPY WITH PROPOFOL N/A 05/22/2017   Procedure: COLONOSCOPY WITH PROPOFOL;  Surgeon: Lollie Sails, MD;  Location: [facility] ENDOSCOPY;  Service: Endoscopy;  Laterality: N/A;   DECOMPRESSIVE LUMBAR LAMINECTOMY LEVEL 2 N/A 11/09/2015   Procedure: DECOMPRESSION L2-5 REMOVAL PEDICULE SCREWS L4-5;  Surgeon: Melina Schools, MD;  Location: [facility];  Service: Orthopedics;  Laterality: N/A;   ESOPHAGOGASTRODUODENOSCOPY (EGD) WITH PROPOFOL N/A 10/09/2018   Procedure: ESOPHAGOGASTRODUODENOSCOPY (EGD) WITH PROPOFOL;  Surgeon: Lollie Sails, MD;  Location: [facility] ENDOSCOPY;   Service: Endoscopy;  Laterality: N/A;   ESOPHAGOGASTRODUODENOSCOPY (EGD) WITH PROPOFOL N/A 05/16/2020   Procedure: ESOPHAGOGASTRODUODENOSCOPY (EGD) WITH PROPOFOL;  Surgeon: Jonathon Bellows, MD;  Location: [facility] ENDOSCOPY;  Service: Gastroenterology;  Laterality: N/A;   EYE SURGERY Bilateral 2005   cataract surgery    HARDWARE REMOVAL N/A 11/09/2015   Procedure: HARDWARE REMOVAL;  Surgeon: Melina Schools, MD;  Location: [facility];  Service: Orthopedics;  Laterality: N/A;   HEMORRHOID SURGERY  1978   IVC filter placement  02-01-10   REDUCTION MAMMAPLASTY Bilateral 03/01/2019   SPINE SURGERY  5 23-12   anterolateral fusion/ rodding   TONSILLECTOMY     TUBAL LIGATION  1977   XI ROBOTIC ASSISTED HIATAL HERNIA REPAIR N/A 05/23/2020   Procedure: XI ROBOTIC ASSISTED HIATAL HERNIA REPAIR with Adrianne Allred, RNFA to assist;  Surgeon: Jules Husbands, MD;  Location: [facility] ORS;  Service: General;  Laterality: N/A;  Provider requesting 3 hours /180 minutes for procedure    Social History Social History   Tobacco Use   Smoking status: Former    Years: 15.00    Pack years: 0.00    Types: Cigarettes    Quit date: 02/26/1976    Years since quitting: 44.5   Smokeless tobacco: Never  Vaping Use   Vaping Use: Never used  Substance Use Topics   Alcohol use: Yes    Comment: rarely   Drug use: No    Family History Family History  Problem Relation Age of Onset   COPD Mother    Heart disease Mother    Aortic aneurysm Mother    Heart disease Father     Allergies  Allergen Reactions   Tape     Paper and adhesive tape make blisters and redness and difficult to remove even on the first day   Elemental Sulfur Nausea And Vomiting   Methylprednisolone Palpitations   Sulfasalazine Nausea Only     REVIEW OF SYSTEMS (Negative unless checked)  Constitutional: [] Weight loss  [] Fever  [] Chills Cardiac: [] Chest pain   [] Chest pressure   [] Palpitations   [] Shortness of breath when laying flat   [] Shortness of  breath with exertion. Vascular:  [] Pain in legs with walking   [] Pain in legs at rest  [x] History of DVT   [] Phlebitis   [] Swelling in legs   [] Varicose veins   [] Non-healing ulcers Pulmonary:   [] Uses home oxygen   [] Productive cough   [] Hemoptysis   [] Wheeze  [] COPD   [] Asthma Neurologic:  [] Dizziness   [] Seizures   [] History of stroke   [] History of TIA  [] Aphasia   [] Vissual changes   [] Weakness or numbness in arm   [] Weakness or numbness in leg Musculoskeletal:   [] Joint swelling   [] Joint pain   [] Low back pain Hematologic:  [] Easy bruising  [] Easy bleeding   [] Hypercoagulable state   []   Anemic Gastrointestinal:  [] Diarrhea   [] Vomiting  [x] Gastroesophageal reflux/heartburn   [] Difficulty swallowing. Genitourinary:  [] Chronic kidney disease   [] Difficult urination  [] Frequent urination   [] Blood in urine Skin:  [] Rashes   [] Ulcers  Psychological:  [] History of anxiety   []  History of major depression.  Physical Examination  Vitals:   09/11/20 0831  BP: 128/88  Pulse: 83  Weight: 162 lb (73.5 kg)  Height: 5\' 4"  (1.626 m)   Body mass index is 27.81 kg/m. Gen: WD/WN, NAD Head: [place]/AT, No temporalis wasting.  Ear/Nose/Throat: Hearing grossly intact, nares w/o erythema or drainage Eyes: PER, EOMI, sclera nonicteric.  Neck: Supple, no large masses.   Pulmonary:  Good air movement, no audible wheezing bilaterally, no use of accessory muscles.  Cardiac: RRR, no JVD Vascular:  scattered varicosities present bilaterally.  Mild venous stasis changes to the legs bilaterally.  1+ soft pitting edema  Vessel Right Left  Radial Palpable Palpable  Gastrointestinal: Non-distended. No guarding/no peritoneal signs.  Musculoskeletal: M/S 5/5 throughout.  No deformity or atrophy.  Neurologic: CN 2-12 intact. Symmetrical.  Speech is fluent. Motor exam as listed above. Psychiatric: Judgment intact, Mood & affect appropriate for pt's clinical situation. Dermatologic: Mild venous rashes no ulcers  noted.  No changes consistent with cellulitis. Lymph : No lichenification or skin changes of chronic lymphedema.  CBC Lab Results  Component Value Date   WBC 8.1 05/24/2020   HGB 11.4 (L) 05/24/2020   HCT 33.9 (L) 05/24/2020   MCV 93.4 05/24/2020   PLT 125 (L) 05/24/2020    BMET    Component Value Date/Time   NA 135 05/24/2020 0517   K 3.9 05/24/2020 0517   CL 107 05/24/2020 0517   CO2 23 05/24/2020 0517   GLUCOSE 141 (H) 05/24/2020 0517   BUN 14 05/24/2020 0517   CREATININE 0.85 05/24/2020 0517   CALCIUM 8.7 (L) 05/24/2020 0517   GFRNONAA >60 05/24/2020 0517   GFRAA >60 01/28/2019 1030   CrCl cannot be calculated (Patient's most recent lab result is older than the maximum 21 days allowed.).  COAG Lab Results  Component Value Date   INR 1.17 08/18/2010   INR 1.09 08/17/2010   INR 1.07 08/15/2010    Radiology No results found.   Assessment/Plan 1. History of DVT (deep vein thrombosis) Recommend:   No surgery or intervention at this point in time. IVC filter is present and was unable to be retrieved..   Patient's duplex ultrasound of the venous system shows the IVC is widely patent with a normal flow pattern.   The patient has completed her anticoagulation   Elevation was stressed, use of a recliner was discussed.   I have had a long discussion with the patient regarding DVT and post phlebitic changes such as swelling and why it  causes symptoms such as pain.  The patient will wear graduated compression stockings class 1 (20-30 mmHg), beginning after three full days of anticoagulation, on a daily basis a prescription was given. The patient will  beginning wearing the stockings first thing in the morning and removing them in the evening. The patient is instructed specifically not to sleep in the stockings.  In addition, behavioral modification including elevation during the day and avoidance of prolonged dependency will be initiated.     The patient will continue  anticoagulation for now as there have not been any problems or complications at this point.  - VAS US AORTA/IVC/ILIACS; Future  2. Chronic venous insufficiency Recommend:  No surgery or intervention at this point in time. IVC filter is present and was unable to be retrieved..   Patient's duplex ultrasound of the venous system shows the IVC is widely patent.   The patient has completed her anticoagulation   Elevation was stressed, use of a recliner was discussed.   I have had a long discussion with the patient regarding DVT and post phlebitic changes such as swelling and why it  causes symptoms such as pain.  The patient will wear graduated compression stockings class 1 (20-30 mmHg), beginning after three full days of anticoagulation, on a daily basis a prescription was given. The patient will  beginning wearing the stockings first thing in the morning and removing them in the evening. The patient is instructed specifically not to sleep in the stockings.  In addition, behavioral modification including elevation during the day and avoidance of prolonged dependency will be initiated.     The patient will continue anticoagulation for now as there have not been any problems or complications at this point.   3. Other hyperlipidemia Continue statin as ordered and reviewed, no changes at this time   4. Gastroesophageal reflux disease without esophagitis Continue PPI as already ordered, this medication has been reviewed and there are no changes at this time.  Avoidence of caffeine and alcohol  Moderate elevation of the head of the bed      Hortencia Pilar, MD  09/11/2020 8:50 AM

## 2020-09-12 DIAGNOSIS — Z1231 Encounter for screening mammogram for malignant neoplasm of breast: Secondary | ICD-10-CM | POA: Diagnosis not present

## 2020-09-13 ENCOUNTER — Other Ambulatory Visit: Payer: Self-pay

## 2020-09-13 ENCOUNTER — Encounter: Payer: Medicare HMO | Admitting: Surgery

## 2020-09-13 ENCOUNTER — Ambulatory Visit: Payer: Medicare HMO | Admitting: Surgery

## 2020-09-13 ENCOUNTER — Encounter: Payer: Self-pay | Admitting: Surgery

## 2020-09-13 VITALS — BP 103/70 | HR 103 | Temp 98.0°F | Ht 64.0 in | Wt 163.0 lb

## 2020-09-13 DIAGNOSIS — R110 Nausea: Secondary | ICD-10-CM | POA: Diagnosis not present

## 2020-09-13 NOTE — Patient Instructions (Signed)
We will send a referral to gastroenterology, Dr Georgeann Oppenheim office. They will contact you to schedule.   Follow up here in about 2 months.   Call the pharmacy if you need refills of your Zofran.

## 2020-09-14 ENCOUNTER — Encounter: Payer: Self-pay | Admitting: Surgery

## 2020-09-14 NOTE — Progress Notes (Signed)
Outpatient Surgical Follow Up  09/14/2020  Elizabeth Crawford is an 75 y.o. female.   Chief Complaint  Patient presents with   Follow-up    HPI:  Lorel is a 75 year old female well-known to me 3-1/2 months out from a redo paraesophageal hernia repair. Continues to have intermittent nausea.  This improves with Zofran.  Now after changing her diet to small frequent meals symptoms seem to be improved .  No fevers no chills. She uses Zofran as needed. Chronic cough and pulmonary symptoms have completely resolved  Past Medical History:  Diagnosis Date   Allergy    Arthritis    Complication of anesthesia    DVT (deep venous thrombosis) (HCC)    GERD (gastroesophageal reflux disease)    H/O seasonal allergies    Hypercholesteremia    PONV (postoperative nausea and vomiting)    Gets sick every time   Pulmonary embolism ([place])    Sleep apnea    Spondylolisthesis     Past Surgical History:  Procedure Laterality Date   BACK SURGERY     x2   BLADDER SUSPENSION  2009   BREAST REDUCTION SURGERY Bilateral 03/01/2019   Procedure: BILATERAL BREAST REDUCTION;  Surgeon: Wallace Going, DO;  Location: [place];  Service: Plastics;  Laterality: Bilateral;  total case 4 hours   CARPAL TUNNEL RELEASE  2003   Left hand   CHOLECYSTECTOMY  2000   Laparoscopic   COLONOSCOPY W/ POLYPECTOMY  2014   COLONOSCOPY WITH PROPOFOL N/A 05/22/2017   Procedure: COLONOSCOPY WITH PROPOFOL;  Surgeon: Lollie Sails, MD;  Location: [facility] ENDOSCOPY;  Service: Endoscopy;  Laterality: N/A;   DECOMPRESSIVE LUMBAR LAMINECTOMY LEVEL 2 N/A 11/09/2015   Procedure: DECOMPRESSION L2-5 REMOVAL PEDICULE SCREWS L4-5;  Surgeon: Melina Schools, MD;  Location: [place];  Service: Orthopedics;  Laterality: N/A;   ESOPHAGOGASTRODUODENOSCOPY (EGD) WITH PROPOFOL N/A 10/09/2018   Procedure: ESOPHAGOGASTRODUODENOSCOPY (EGD) WITH PROPOFOL;  Surgeon: Lollie Sails, MD;  Location: [facility] ENDOSCOPY;  Service: Endoscopy;  Laterality:  N/A;   ESOPHAGOGASTRODUODENOSCOPY (EGD) WITH PROPOFOL N/A 05/16/2020   Procedure: ESOPHAGOGASTRODUODENOSCOPY (EGD) WITH PROPOFOL;  Surgeon: Jonathon Bellows, MD;  Location: [facility] ENDOSCOPY;  Service: Gastroenterology;  Laterality: N/A;   EYE SURGERY Bilateral 2005   cataract surgery    HARDWARE REMOVAL N/A 11/09/2015   Procedure: HARDWARE REMOVAL;  Surgeon: Melina Schools, MD;  Location: [place];  Service: Orthopedics;  Laterality: N/A;   HEMORRHOID SURGERY  1978   IVC filter placement  02-01-10   REDUCTION MAMMAPLASTY Bilateral 03/01/2019   SPINE SURGERY  5 23-12   anterolateral fusion/ rodding   TONSILLECTOMY     TUBAL LIGATION  1977   XI ROBOTIC ASSISTED HIATAL HERNIA REPAIR N/A 05/23/2020   Procedure: XI ROBOTIC ASSISTED HIATAL HERNIA REPAIR with Armida Sans, RNFA to assist;  Surgeon: Jules Husbands, MD;  Location: [facility] ORS;  Service: General;  Laterality: N/A;  Provider requesting 3 hours /180 minutes for procedure    Family History  Problem Relation Age of Onset   COPD Mother    Heart disease Mother    Aortic aneurysm Mother    Heart disease Father     Social History:  reports that she quit smoking about 44 years ago. Her smoking use included cigarettes. She has never used smokeless tobacco. She reports current alcohol use. She reports that she does not use drugs.  Allergies:  Allergies  Allergen Reactions   Tape     Paper and adhesive tape make blisters and redness  and difficult to remove even on the first day   Elemental Sulfur Nausea And Vomiting   Methylprednisolone Palpitations   Sulfasalazine Nausea Only    Medications reviewed.    ROS Full ROS performed and is otherwise negative other than what is stated in HPI   BP 103/70   Pulse (!) 103   Temp 98 F (36.7 C)   Ht 5\' 4"  (1.626 m)   Wt 163 lb (73.9 kg)   SpO2 97%   BMI 27.98 kg/m   Physical Exam  NAD ,alert Abd: soft, nt . Incisions healed Ext: no edema and well  perfused   Assessment/Plan: Persistent nausea after redo paraesophageal hernia.  There is no surgery complication.  Discussed with the patient detail about performing further work-up with barium swallow as well as CT scan.  The patient feels well and she right weights.  I will see her back in a couple months and at that time determine if further work-up is needed.  Greater than 50% of the 20 minutes  visit was spent in counseling/coordination of care   Caroleen Hamman, MD [place] Surgeon

## 2020-09-29 ENCOUNTER — Other Ambulatory Visit: Payer: Self-pay

## 2020-09-29 ENCOUNTER — Encounter: Payer: Self-pay | Admitting: Thoracic Surgery (Cardiothoracic Vascular Surgery)

## 2020-09-29 ENCOUNTER — Institutional Professional Consult (permissible substitution): Payer: Medicare HMO | Admitting: Thoracic Surgery (Cardiothoracic Vascular Surgery)

## 2020-09-29 VITALS — BP 109/69 | HR 80 | Resp 20 | Ht 64.0 in | Wt 163.0 lb

## 2020-09-29 DIAGNOSIS — K449 Diaphragmatic hernia without obstruction or gangrene: Secondary | ICD-10-CM

## 2020-09-29 NOTE — Progress Notes (Signed)
[address]       [place] [address]             [phone]                    Elizabeth Crawford [place] Medical Record #[id number] Date of Birth: 05-Feb-1946  Referring: Gretta Arab, MD Primary Care: Maryland Pink, MD Primary Cardiologist: None  Chief Complaint:    Chief Complaint  Patient presents with   Hiatal Hernia    Surgical consult,     History of Present Illness:    Elizabeth Crawford 75 y.o. female referred by Dr. Rolena Infante for surgical evaluation of persistent nausea in the setting of redo paraesophageal hernia repair.  In August 2020 she underwent a robotic assisted paraesophageal hernia repair with fundoplication.  Unfortunately she developed recurrence, and underwent a redo hernia repair with bio a mesh placement.  She currently complains of of persistent nausea that appears to be responsive to Zofran, but she is unable to clear of any eliciting factors.  She denies any reflux, or dysphagia.  Her weight has been stable.      Zubrod Score: At the time of surgery this patient's most appropriate activity status/level should be described as: [x]     0    Normal activity, no symptoms []     1    Restricted in physical strenuous activity but ambulatory, able to do out light work []     2    Ambulatory and capable of self care, unable to do work activities, up and about               >50 % of waking hours                              []     3    Only limited self care, in bed greater than 50% of waking hours []     4    Completely disabled, no self care, confined to bed or chair []     5    Moribund   Past Medical History:  Diagnosis Date   Allergy    Arthritis    Complication of anesthesia    DVT (deep venous thrombosis) (HCC)    GERD (gastroesophageal reflux disease)    H/O seasonal allergies    Hypercholesteremia    PONV (postoperative nausea and vomiting)    Gets sick every time   Pulmonary embolism ([place])    Sleep apnea     Spondylolisthesis     Past Surgical History:  Procedure Laterality Date   BACK SURGERY     x2   BLADDER SUSPENSION  2009   BREAST REDUCTION SURGERY Bilateral 03/01/2019   Procedure: BILATERAL BREAST REDUCTION;  Surgeon: Wallace Going, DO;  Location: [place];  Service: Plastics;  Laterality: Bilateral;  total case 4 hours   CARPAL TUNNEL RELEASE  2003   Left hand   CHOLECYSTECTOMY  2000   Laparoscopic   COLONOSCOPY W/ POLYPECTOMY  2014   COLONOSCOPY WITH PROPOFOL N/A 05/22/2017   Procedure: COLONOSCOPY WITH PROPOFOL;  Surgeon: Lollie Sails, MD;  Location: [facility] ENDOSCOPY;  Service: Endoscopy;  Laterality: N/A;   DECOMPRESSIVE LUMBAR LAMINECTOMY LEVEL 2 N/A 11/09/2015   Procedure: DECOMPRESSION L2-5 REMOVAL PEDICULE SCREWS L4-5;  Surgeon: Melina Schools, MD;  Location: [place];  Service: Orthopedics;  Laterality: N/A;   ESOPHAGOGASTRODUODENOSCOPY (EGD) WITH  PROPOFOL N/A 10/09/2018   Procedure: ESOPHAGOGASTRODUODENOSCOPY (EGD) WITH PROPOFOL;  Surgeon: Lollie Sails, MD;  Location: [facility] ENDOSCOPY;  Service: Endoscopy;  Laterality: N/A;   ESOPHAGOGASTRODUODENOSCOPY (EGD) WITH PROPOFOL N/A 05/16/2020   Procedure: ESOPHAGOGASTRODUODENOSCOPY (EGD) WITH PROPOFOL;  Surgeon: Jonathon Bellows, MD;  Location: [facility] ENDOSCOPY;  Service: Gastroenterology;  Laterality: N/A;   EYE SURGERY Bilateral 2005   cataract surgery    HARDWARE REMOVAL N/A 11/09/2015   Procedure: HARDWARE REMOVAL;  Surgeon: Melina Schools, MD;  Location: [place];  Service: Orthopedics;  Laterality: N/A;   HEMORRHOID SURGERY  1978   IVC filter placement  02-01-10   REDUCTION MAMMAPLASTY Bilateral 03/01/2019   SPINE SURGERY  5 23-12   anterolateral fusion/ rodding   TONSILLECTOMY     TUBAL LIGATION  1977   XI ROBOTIC ASSISTED HIATAL HERNIA REPAIR N/A 05/23/2020   Procedure: XI ROBOTIC ASSISTED HIATAL HERNIA REPAIR with Adrianne Allred, RNFA to assist;  Surgeon: Jules Husbands, MD;  Location: [facility] ORS;  Service: General;  Laterality:  N/A;  Provider requesting 3 hours /180 minutes for procedure    Family History  Problem Relation Age of Onset   COPD Mother    Heart disease Mother    Aortic aneurysm Mother    Heart disease Father      Social History   Tobacco Use  Smoking Status Former   Years: 15.00   Pack years: 0.00   Types: Cigarettes   Quit date: 02/26/1976   Years since quitting: 44.6  Smokeless Tobacco Never    Social History   Substance and Sexual Activity  Alcohol Use Yes   Comment: rarely     Allergies  Allergen Reactions   Tape     Paper and adhesive tape make blisters and redness and difficult to remove even on the first day   Elemental Sulfur Nausea And Vomiting   Methylprednisolone Palpitations   Sulfasalazine Nausea Only    Current Outpatient Medications  Medication Sig Dispense Refill   aspirin 81 MG EC tablet Take 81 mg by mouth at bedtime.     atorvastatin (LIPITOR) 10 MG tablet 1 tablet     cetirizine (ZYRTEC) 10 MG tablet Take 10 mg by mouth daily.     clotrimazole-betamethasone (LOTRISONE) cream Apply 1 application topically daily as needed (itching with skin folds).      hydroxypropyl methylcellulose / hypromellose (ISOPTO TEARS / GONIOVISC) 2.5 % ophthalmic solution Place 1 drop into both eyes daily as needed for dry eyes.     ibuprofen (ADVIL) 200 MG tablet Take 400 mg by mouth every 8 (eight) hours as needed (pain).     ondansetron (ZOFRAN-ODT) 4 MG disintegrating tablet Take 4 mg by mouth every 8 (eight) hours as needed for nausea or vomiting.     rosuvastatin (CRESTOR) 5 MG tablet Take 5 mg by mouth at bedtime.      No current facility-administered medications for this visit.    Review of Systems  Constitutional: Negative.   Respiratory:  Negative for shortness of breath.   Gastrointestinal:  Positive for nausea. Negative for abdominal pain, heartburn and vomiting.  Musculoskeletal:  Positive for joint pain and myalgias.    PHYSICAL EXAMINATION: BP 109/69    Pulse 80   Resp 20   Ht 5\' 4"  (1.626 m)   Wt 163 lb (73.9 kg)   SpO2 96% Comment: RA  BMI 27.98 kg/m  Physical Exam Constitutional:      General: She is not in acute distress.  Appearance: Normal appearance. She is normal weight. She is not ill-appearing.  HENT:     Head: Normocephalic and atraumatic.  Cardiovascular:     Rate and Rhythm: Normal rate.  Pulmonary:     Effort: Pulmonary effort is normal.  Abdominal:     General: Abdomen is flat. There is no distension.  Musculoskeletal:        General: Normal range of motion.  Neurological:     Mental Status: She is alert.         I have independently reviewed the above radiology studies  and reviewed the findings with the patient.   Recent Lab Findings: Lab Results  Component Value Date   WBC 8.1 05/24/2020   HGB 11.4 (L) 05/24/2020   HCT 33.9 (L) 05/24/2020   PLT 125 (L) 05/24/2020   GLUCOSE 141 (H) 05/24/2020   NA 135 05/24/2020   K 3.9 05/24/2020   CL 107 05/24/2020   CREATININE 0.85 05/24/2020   BUN 14 05/24/2020   CO2 23 05/24/2020   INR 1.17 08/18/2010       Assessment / Plan:   75 year old female status post redo paraesophageal hernia repair presents with persistent nausea.  Unfortunately she does not have any recent imaging since her last operation in March of this year.  Symptomatically she denies any dysphagia or odynophagia.  Her main complaint is simply nausea.  I will discuss this with her original surgeon, as well as her gastroenterologist.  There should be consideration for referral to our foregut specialist Dr. Silverio Decamp, but I will leave this up to her primary team.     I  spent 40 minutes with the patient face to face counseling and coordination of care.    Lajuana Matte 09/29/2020 4:15 PM

## 2020-10-02 DIAGNOSIS — M5459 Other low back pain: Secondary | ICD-10-CM | POA: Diagnosis not present

## 2020-10-05 DIAGNOSIS — G4733 Obstructive sleep apnea (adult) (pediatric): Secondary | ICD-10-CM | POA: Diagnosis not present

## 2020-11-01 DIAGNOSIS — M545 Low back pain, unspecified: Secondary | ICD-10-CM | POA: Diagnosis not present

## 2020-11-03 DIAGNOSIS — M545 Low back pain, unspecified: Secondary | ICD-10-CM | POA: Diagnosis not present

## 2020-11-05 DIAGNOSIS — G4733 Obstructive sleep apnea (adult) (pediatric): Secondary | ICD-10-CM | POA: Diagnosis not present

## 2020-11-08 ENCOUNTER — Other Ambulatory Visit: Payer: Self-pay

## 2020-11-08 ENCOUNTER — Ambulatory Visit: Payer: Medicare HMO | Admitting: Gastroenterology

## 2020-11-08 ENCOUNTER — Encounter: Payer: Self-pay | Admitting: Gastroenterology

## 2020-11-08 VITALS — BP 113/77 | HR 69 | Temp 98.3°F | Ht 64.0 in | Wt 157.2 lb

## 2020-11-08 DIAGNOSIS — K3184 Gastroparesis: Secondary | ICD-10-CM | POA: Diagnosis not present

## 2020-11-08 DIAGNOSIS — R110 Nausea: Secondary | ICD-10-CM | POA: Diagnosis not present

## 2020-11-08 MED ORDER — METOCLOPRAMIDE HCL 5 MG PO TABS: 5.0000 mg | ORAL_TABLET | Freq: Three times a day (TID) | ORAL | 0 refills | Status: AC | PRN

## 2020-11-08 MED ORDER — METOCLOPRAMIDE HCL 5 MG PO TABS: 5.0000 mg | ORAL_TABLET | Freq: Three times a day (TID) | ORAL | 0 refills | Status: DC

## 2020-11-08 MED ORDER — DEXLANSOPRAZOLE 60 MG PO CPDR: 60.0000 mg | DELAYED_RELEASE_CAPSULE | Freq: Every day | ORAL | 3 refills | Status: DC

## 2020-11-08 NOTE — Addendum Note (Signed)
Addended by: Wayna Chalet on: 11/08/2020 10:55 AM   Modules accepted: Orders

## 2020-11-08 NOTE — Patient Instructions (Signed)
Please do not take any gastroenterology medications 8 hours prior to your gastric emptying study.

## 2020-11-08 NOTE — Progress Notes (Signed)
Jonathon Bellows MD, MRCP(U.K) [address]  [place]  [place], [place] [address]  Main: [phone]  Fax: [phone]   Primary Care Physician: Maryland Pink, MD  Primary Gastroenterologist:  Dr. Jonathon Bellows   Chief complaint follow-up for nausea and vomiting.   HPI: Elizabeth Crawford is a 75 y.o. female   Summary of history :  Initially referred and seen in February 2022 for vomiting by Dr. Dahlia Byes.  She was previously seen by Dr. Gustavo Lah at [facility] gastroenterology.  He has a history of a hiatal hernia and chronic issues with microaspiration plan was for an upper endoscopy at that point of time.  Barium swallow in December 2021 demonstrated slipped Nissen with herniation of the remaining upper stomach into the chest with narrowing of the channel of the esophageal hiatus.  Possible esophagitis.  Fold thickening the stomach slightly more irregular than expected for simple esophagitis and endoscopic evaluation was suggested.  Transient arrest of the barium tablet in the mid esophagus at the aortic impression and further evaluation was recommended.  In January 2022 underwent a CT scan of the chest with contrast that showed recurrent small to moderate hiatal hernia mildly patulous thoracic esophagus filled with oral contrast suggesting esophageal dysmotility or GERD.  Multiple bilateral pulmonary nodules.  At the same time also had a CT abdomen as well.   She underwent an endoscopy in July 2020 by Dr. Gustavo Lah noted to have a medium sized hiatal hernia with multiple Cameron ulcers, multiple benign-appearing polyps were seen in the gastric cardia.  When seen by Dr. Dahlia Byes  on 04/17/2020 plan was to perform a revision surgery of the hiatal hernia repair in March 2022 and he has requested an endoscopy prior to that.   She states that after her first surgery for her hernia in 2020 she had significant resolution of her symptoms predominantly aspiration.  Presently she has on and off  symptoms of regurgitation/vomiting usually in the morning when she wakes up and the contents are usually foamy material and no food is included.  Denies any dysphagia.  Denies any weight loss.  Interval history   04/26/2020-11/08/2020  05/16/2020: EGD: Features suggestive of Candida esophagitis was seen in the esophagus brushings were taken.  Medium size hiatal hernia was noted.  Multiple 7 to 12 mm sessile polyps were noted in the stomach greater curvature number of them were resected.  Brushings did show yeast with pseudohyphae and she was treated with Diflucan.  Pathology specimen demonstrated the polyps were fundic gland polyps.  Subsequently had a redo of paraesophageal hernia repair, continue to have intermittent nausea improved with Zofran.  She was subsequently seen by thoracic surgeon for nausea  She states that since her surgery she has had nausea most days of the week.  She takes a few bites of food and feels like she cannot eat anything more cannot throw up.  Not taking any PPI.  Feels like the food sits in the stomach for long period of time associated bloating and abdominal distention.  Making her life miserable take Zofran which works has not tried Reglan.  Current Outpatient Medications  Medication Sig Dispense Refill   aspirin 81 MG EC tablet Take 81 mg by mouth at bedtime.     cetirizine (ZYRTEC) 10 MG tablet Take 10 mg by mouth daily.     clotrimazole-betamethasone (LOTRISONE) cream Apply 1 application topically daily as needed (itching with skin folds).      hydroxypropyl methylcellulose / hypromellose (ISOPTO  TEARS / GONIOVISC) 2.5 % ophthalmic solution Place 1 drop into both eyes daily as needed for dry eyes.     ibuprofen (ADVIL) 200 MG tablet Take 400 mg by mouth every 8 (eight) hours as needed (pain).     ondansetron (ZOFRAN-ODT) 4 MG disintegrating tablet Take 4 mg by mouth every 8 (eight) hours as needed for nausea or vomiting.     rosuvastatin (CRESTOR) 5 MG tablet Take 5 mg  by mouth at bedtime.      No current facility-administered medications for this visit.    Allergies as of 11/08/2020 - Review Complete 11/08/2020  Allergen Reaction Noted   Tape  10/20/2018   Elemental sulfur Nausea And Vomiting 11/28/1998   Methylprednisolone Palpitations 06/30/2015   Sulfasalazine Nausea Only 05/27/2014    ROS:  General: Negative for anorexia, weight loss, fever, chills, fatigue, weakness. ENT: Negative for hoarseness, difficulty swallowing , nasal congestion. CV: Negative for chest pain, angina, palpitations, dyspnea on exertion, peripheral edema.  Respiratory: Negative for dyspnea at rest, dyspnea on exertion, cough, sputum, wheezing.  GI: See history of present illness. GU:  Negative for dysuria, hematuria, urinary incontinence, urinary frequency, nocturnal urination.  Endo: Negative for unusual weight change.    Physical Examination:   BP 113/77   Pulse 69   Temp 98.3 F (36.8 C) (Oral)   Ht '5\' 4"'$  (1.626 m)   Wt 157 lb 3.2 oz (71.3 kg)   BMI 26.98 kg/m   General: Well-nourished, well-developed in no acute distress.  Eyes: No icterus. Conjunctivae pink. Extremities: No lower extremity edema. No clubbing or deformities. Neuro: Alert and oriented x 3.  Grossly intact. Skin: Warm and dry, no jaundice.   Psych: Alert and cooperative, normal mood and affect.   Imaging Studies: No results found.  Assessment and Plan:   Elizabeth Crawford is a 75 y.o. y/o female here to follow-up for GERD.  History of a prior Nissen fundoplication that has slipped and she is being prepped for surgery in March 2022.  Underwent CT scan of the chest as well as barium swallow which shows abnormality of the esophagus possibly due to esophagitis as well as holding up of the barium tablet.  EGD showed hiatal hernia, Candida esophagitis that was treated with Diflucan.  Subsequently had robotic surgery and had her paraesophageal hernia repair.  Since then her only symptom has been  nausea. Based on her history it is very likely that her symptoms are secondary to delayed gastric emptying i.e. gastroparesis.  I explained to her that it is very possible that she has some scar tissue which would be expected from a complicated surgery and this may affect gastric emptying..    Plan 1.  Gastric emptying study 2.  Trial of Reglan I will give her 30 tablets with no refills to see if it helps her feel better.  Explained to her side effects of tardive dyskinesia. 3.  I will check her LFTs if abnormal will further evaluate to rule out stones in common bile duct.  She is status postcholecystectomy occasionally biliary issues can also cause chronic nausea. 4.  H. pylori breath test, TSH 5.  If above works we will prescribe a gastroparesis diet.  If it does not work as well we will try a gastroparesis diet as gastroparesis can be episodic. 6.  Trial of Dexilant which is dexlansoprazole.  She has tried pantoprazole at home which did not work which is very likely possible if there is already food in the  stomach from delayed gastric emptying may prevent the activation of the medication.  Hence I will try dexlansoprazole which does not depend on the presence or absence of food in the stomach.  Often silent reflux may present with symptoms of nausea  Dr Jonathon Bellows  MD,MRCP [facility]) Follow up in 2 to 3 weeks

## 2020-11-08 NOTE — Addendum Note (Signed)
Addended by: Wayna Chalet on: 11/08/2020 10:51 AM   Modules accepted: Orders

## 2020-11-09 ENCOUNTER — Other Ambulatory Visit: Payer: Self-pay

## 2020-11-09 LAB — HEPATIC FUNCTION PANEL
ALT: 15 IU/L (ref 0–32)
AST: 20 IU/L (ref 0–40)
Albumin: 4.1 g/dL (ref 3.7–4.7)
Alkaline Phosphatase: 81 IU/L (ref 44–121)
Bilirubin Total: 0.6 mg/dL (ref 0.0–1.2)
Bilirubin, Direct: 0.2 mg/dL (ref 0.00–0.40)
Total Protein: 6 g/dL (ref 6.0–8.5)

## 2020-11-09 LAB — H. PYLORI BREATH TEST: H pylori Breath Test: NEGATIVE

## 2020-11-09 LAB — TSH: TSH: 3.75 u[IU]/mL (ref 0.450–4.500)

## 2020-11-09 MED ORDER — OMEPRAZOLE 40 MG PO CPDR: 40.0000 mg | DELAYED_RELEASE_CAPSULE | Freq: Every day | ORAL | 3 refills | Status: DC

## 2020-11-17 DIAGNOSIS — H539 Unspecified visual disturbance: Secondary | ICD-10-CM | POA: Diagnosis not present

## 2020-11-22 ENCOUNTER — Other Ambulatory Visit: Payer: Self-pay

## 2020-11-22 ENCOUNTER — Ambulatory Visit: Payer: Medicare HMO | Admitting: Surgery

## 2020-11-22 ENCOUNTER — Encounter: Payer: Self-pay | Admitting: Surgery

## 2020-11-22 VITALS — BP 117/76 | HR 76 | Temp 98.1°F | Ht 64.0 in | Wt 161.0 lb

## 2020-11-22 DIAGNOSIS — R110 Nausea: Secondary | ICD-10-CM | POA: Diagnosis not present

## 2020-11-22 NOTE — Progress Notes (Signed)
Elizabeth Crawford is a 74 year old female well-known to me with status post paraesophageal hernia repair with mesh close to 6 months ago.  She did develop some abdominal distention discomfort and bloating as well as nausea.  She has seen by Dr. Vicente Males and started on Reglan.  She did have significant improvement.  She also changed diet that seem to have helped. She is taking p.o.  No evidence of dysphagia.  No evidence of pulmonary exacerbation.  No evidence of reflux.  PE: NAD, alert Chest CTA, no wheezes or rales.  Normal sinus rhythm.  S1 and S2. Abdomen: Soft scars from robotic surgery.  No evidence of hernias.  No tenderness and no peritonitis. Extremities: Warm well perfused. Neurological.  Awake and alert.  GCS of 15 no motor or sensory deficits.  A/P some nausea and bloating likely related to gastroparesis that is responded to diet modification and as needed Reglan.  Currently no evidence of complications related to the redo paraesophageal hernia repair.  Discussed with the patient detail I would like to follow her up in about 6 months.  Please note that I spent 20 minutes in this encounter with greater than 50% spent in coordination and counseling of her care.

## 2020-11-22 NOTE — Patient Instructions (Addendum)
You have been added to the recall list. Someone should call you in February to set up your next appointment. If you do not hear from [place], please call our office.  If you have any concerns or questions, please feel free to call our office.    Hiatal Hernia  A hiatal hernia occurs when part of the stomach slides above the muscle that separates the abdomen from the chest (diaphragm). A person can be born with a hiatal hernia (congenital), or it may develop over time. In almost all cases of hiatal hernia, only the top part of the stomach pushes through the diaphragm. Many people have a hiatal hernia with no symptoms. The larger the hernia, the more likely it is that you will have symptoms. In some cases, a hiatal hernia allows stomach acid to flow back into the tube that carries food from your mouth to your stomach (esophagus). This may cause heartburn symptoms. Severe heartburn symptoms may mean that you have developed a condition called gastroesophageal reflux disease (GERD). What are the causes? This condition is caused by a weakness in the opening (hiatus) where the esophagus passes through the diaphragm to attach to the upper part of the stomach. A person may be born with a weakness in the hiatus, or a weakness can develop over time. What increases the risk? This condition is more likely to develop in: Older people. Age is a major risk factor for a hiatal hernia, especially if you are over the age of 46. Pregnant women. People who are overweight. People who have frequent constipation. What are the signs or symptoms? Symptoms of this condition usually develop in the form of GERD symptoms. Symptoms include: Heartburn. Belching. Indigestion. Trouble swallowing. Coughing or wheezing. Sore throat. Hoarseness. Chest pain. Nausea and vomiting. How is this diagnosed? This condition may be diagnosed during testing for GERD. Tests that may be done include: X-rays of your stomach or chest. An  upper gastrointestinal (GI) series. This is an X-ray exam of your GI tract that is taken after you swallow a chalky liquid that shows up clearly on the X-ray. Endoscopy. This is a procedure to look into your stomach using a thin, flexible tube that has a tiny camera and light on the end of it. How is this treated? This condition may be treated by: Dietary and lifestyle changes to help reduce GERD symptoms. Medicines. These may include: Over-the-counter antacids. Medicines that make your stomach empty more quickly. Medicines that block the production of stomach acid (H2 blockers). Stronger medicines to reduce stomach acid (proton pump inhibitors). Surgery to repair the hernia, if other treatments are not helping. If you have no symptoms, you may not need treatment. Follow these instructions at home: Lifestyle and activity Do not use any products that contain nicotine or tobacco, such as cigarettes and e-cigarettes. If you need help quitting, ask your health care provider. Try to achieve and maintain a healthy body weight. Avoid putting pressure on your abdomen. Anything that puts pressure on your abdomen increases the amount of acid that may be pushed up into your esophagus. Avoid bending over, especially after eating. Raise the head of your bed by putting blocks under the legs. This keeps your head and esophagus higher than your stomach. Do not wear tight clothing around your chest or stomach. Try not to strain when having a bowel movement, when urinating, or when lifting heavy objects. Eating and drinking Avoid foods that can worsen GERD symptoms. These may include: Fatty foods, like fried  foods. Citrus fruits, like oranges or lemon. Other foods and drinks that contain acid, like orange juice or tomatoes. Spicy food. Chocolate. Eat frequent small meals instead of three large meals a day. This helps prevent your stomach from getting too full. Eat slowly. Do not lie down right after  eating. Do not eat 1-2 hours before bed. Do not drink beverages with caffeine. These include cola, coffee, cocoa, and tea. Do not drink alcohol. General instructions Take over-the-counter and prescription medicines only as told by your health care provider. Keep all follow-up visits as told by your health care provider. This is important. Contact a health care provider if: Your symptoms are not controlled with medicines or lifestyle changes. You are having trouble swallowing. You have coughing or wheezing that will not go away. Get help right away if: Your pain is getting worse. Your pain spreads to your arms, neck, jaw, teeth, or back. You have shortness of breath. You sweat for no reason. You feel sick to your stomach (nauseous) or you vomit. You vomit blood. You have bright red blood in your stools. You have black, tarry stools. Summary A hiatal hernia occurs when part of the stomach slides above the muscle that separates the abdomen from the chest (diaphragm). A person may be born with a weakness in the hiatus, or a weakness can develop over time. Symptoms of hiatal hernia may include heartburn, trouble swallowing, or sore throat. Management of hiatal hernia includes eating frequent small meals instead of three large meals a day. Get help right away if you vomit blood, have bright red blood in your stools, or have black, tarry stools. This information is not intended to replace advice given to you by your health care provider. Make sure you discuss any questions you have with your health care provider. Document Revised: 02/10/2020 Document Reviewed: 02/10/2020 Elsevier Patient Education  2022 [employer].

## 2020-11-23 ENCOUNTER — Encounter: Payer: Self-pay | Admitting: Gastroenterology

## 2020-11-23 ENCOUNTER — Ambulatory Visit: Payer: Medicare HMO | Admitting: Gastroenterology

## 2020-11-23 VITALS — BP 124/78 | HR 70 | Temp 97.8°F | Ht 64.0 in | Wt 162.4 lb

## 2020-11-23 DIAGNOSIS — K3184 Gastroparesis: Secondary | ICD-10-CM

## 2020-11-23 DIAGNOSIS — R110 Nausea: Secondary | ICD-10-CM | POA: Diagnosis not present

## 2020-11-23 NOTE — Progress Notes (Signed)
tr   Jonathon Bellows MD, MRCP(U.K) [address]  [place]  [place], [place] [address]  Main: [phone]  Fax: [phone]   Primary Care Physician: [place] Pink, MD  Primary Gastroenterologist:  Dr. Jonathon Bellows    C/c Follow up nausea  HPI: Elizabeth Crawford is a 75 y.o. female  Summary of history :   Initially referred and seen in February 2022 for vomiting by Dr. Dahlia Byes.  She was previously seen by Dr. Gustavo Lah at [facility] clinic gastroenterology.  He has a history of a hiatal hernia and chronic issues with microaspiration plan was for an upper endoscopy at that point of time.  Barium swallow in December 2021 demonstrated slipped Nissen with herniation of the remaining upper stomach into the chest with narrowing of the channel of the esophageal hiatus.  Possible esophagitis.  Fold thickening the stomach slightly more irregular than expected for simple esophagitis and endoscopic evaluation was suggested.  Transient arrest of the barium tablet in the mid esophagus at the aortic impression and further evaluation was recommended.  In January 2022 underwent a CT scan of the chest with contrast that showed recurrent small to moderate hiatal hernia mildly patulous thoracic esophagus filled with oral contrast suggesting esophageal dysmotility or GERD.  Multiple bilateral pulmonary nodules.  At the same time also had a CT abdomen as well.   She underwent an endoscopy in July 2020 by Dr. Gustavo Lah noted to have a medium sized hiatal hernia with multiple Cameron ulcers, multiple benign-appearing polyps were seen in the gastric cardia.  When seen by Dr. Dahlia Byes  on 04/17/2020 plan was to perform a revision surgery of the hiatal hernia repair in March 2022 and he has requested an endoscopy prior to that.   She states that after her first surgery for her hernia in 2020 she had significant resolution of her symptoms predominantly aspiration.  Presently she has on and off symptoms of regurgitation/vomiting  usually in the morning when she wakes up and the contents are usually foamy material and no food is included.  Denies any dysphagia.  Denies any weight loss.   05/16/2020: EGD: Features suggestive of Candida esophagitis was seen in the esophagus brushings were taken.  Medium size hiatal hernia was noted.  Multiple 7 to 12 mm sessile polyps were noted in the stomach greater curvature number of them were resected.  Brushings did show yeast with pseudohyphae and she was treated with Diflucan.  Pathology specimen demonstrated the polyps were fundic gland polyps. Subsequently had a redo of paraesophageal hernia repair, continue to have intermittent nausea improved with Zofran.  She was subsequently seen by thoracic surgeon for nausea   She states that since her surgery she has had nausea most days of the week.  She takes a few bites of food and feels like she cannot eat anything more cannot throw up.   Interval history   11/08/2020-11/23/2020   11/08/2020: LFT,TSH, H pylori breath test normal  Gastric emptying study scheduled for 12/22/2020 She is doing much better.  Having occasional nausea.  Takes up to 1 time a day Reglan as needed.  Able to keep food down.     Current Outpatient Medications  Medication Sig Dispense Refill   amoxicillin (AMOXIL) 875 MG tablet Take 875 mg by mouth 2 (two) times daily.     aspirin 81 MG EC tablet Take 81 mg by mouth at bedtime.     cetirizine (ZYRTEC) 10 MG tablet Take 10 mg by mouth daily.  clotrimazole-betamethasone (LOTRISONE) cream Apply 1 application topically daily as needed (itching with skin folds).      hydroxypropyl methylcellulose / hypromellose (ISOPTO TEARS / GONIOVISC) 2.5 % ophthalmic solution Place 1 drop into both eyes daily as needed for dry eyes.     ibuprofen (ADVIL) 200 MG tablet Take 400 mg by mouth every 8 (eight) hours as needed (pain).     metoCLOPramide (REGLAN) 5 MG tablet Take 1 tablet (5 mg total) by mouth 3 (three) times daily as  needed for nausea. 30 tablet 0   omeprazole (PRILOSEC) 40 MG capsule Take 1 capsule (40 mg total) by mouth daily. 90 capsule 3   rosuvastatin (CRESTOR) 5 MG tablet Take 5 mg by mouth at bedtime.      No current facility-administered medications for this visit.    Allergies as of 11/23/2020 - Review Complete 11/23/2020  Allergen Reaction Noted   Tape  10/20/2018   Elemental sulfur Nausea And Vomiting 11/28/1998   Methylprednisolone Palpitations 06/30/2015   Sulfasalazine Nausea Only 05/27/2014    ROS:  General: Negative for anorexia, weight loss, fever, chills, fatigue, weakness. ENT: Negative for hoarseness, difficulty swallowing , nasal congestion. CV: Negative for chest pain, angina, palpitations, dyspnea on exertion, peripheral edema.  Respiratory: Negative for dyspnea at rest, dyspnea on exertion, cough, sputum, wheezing.  GI: See history of present illness. GU:  Negative for dysuria, hematuria, urinary incontinence, urinary frequency, nocturnal urination.  Endo: Negative for unusual weight change.    Physical Examination:   BP 124/78   Pulse 70   Temp 97.8 F (36.6 C) (Oral)   Ht '5\' 4"'$  (1.626 m)   Wt 162 lb 6.4 oz (73.7 kg)   BMI 27.88 kg/m   General: Well-nourished, well-developed in no acute distress.  Eyes: No icterus. Conjunctivae pink. Extremities: No lower extremity edema. No clubbing or deformities. Neuro: Alert and oriented x 3.  Grossly intact. Skin: Warm and dry, no jaundice.   Psych: Alert and cooperative, normal mood and affect.   Imaging Studies: No results found.  Assessment and Plan:   Elizabeth Crawford is a 75 y.o. y/o female here to follow-up for GERD.  Status post repair of paraesophageal hernia and since then her only symptom has been nausea.Based on her history it is very likely that her symptoms are secondary to delayed gastric emptying i.e. gastroparesis secondary to scar tissue which would be expected from a complicated surgery and this may  affect gastric emptying.  She has responded very well to Reglan and has noticed a significant improvement in her symptoms.   Plan 1.  Continue Reglan as needed.  Explained to her to gradually try and wean off Reglan.  Advised that if symptoms get worse to give my office a call.  We discussed about lifestyle changes as well which she is going to enforce  Dr Jonathon Bellows  MD,MRCP [facility]) Follow up in as needed

## 2020-11-24 ENCOUNTER — Other Ambulatory Visit: Payer: Self-pay | Admitting: Family Medicine

## 2020-11-24 DIAGNOSIS — D485 Neoplasm of uncertain behavior of skin: Secondary | ICD-10-CM | POA: Diagnosis not present

## 2020-11-24 DIAGNOSIS — L570 Actinic keratosis: Secondary | ICD-10-CM | POA: Diagnosis not present

## 2020-11-24 DIAGNOSIS — H5461 Unqualified visual loss, right eye, normal vision left eye: Secondary | ICD-10-CM

## 2020-11-24 DIAGNOSIS — L821 Other seborrheic keratosis: Secondary | ICD-10-CM | POA: Diagnosis not present

## 2020-12-08 ENCOUNTER — Ambulatory Visit
Admission: RE | Admit: 2020-12-08 | Discharge: 2020-12-08 | Disposition: A | Discharge status: Home / Self Care | Payer: Medicare HMO | Source: Ambulatory Visit | Attending: Family Medicine | Admitting: Family Medicine

## 2020-12-08 ENCOUNTER — Other Ambulatory Visit: Payer: Self-pay

## 2020-12-08 DIAGNOSIS — H5461 Unqualified visual loss, right eye, normal vision left eye: Secondary | ICD-10-CM | POA: Diagnosis not present

## 2020-12-08 DIAGNOSIS — E785 Hyperlipidemia, unspecified: Secondary | ICD-10-CM | POA: Diagnosis not present

## 2020-12-13 DIAGNOSIS — G4733 Obstructive sleep apnea (adult) (pediatric): Secondary | ICD-10-CM | POA: Diagnosis not present

## 2020-12-13 DIAGNOSIS — L570 Actinic keratosis: Secondary | ICD-10-CM | POA: Diagnosis not present

## 2020-12-21 DIAGNOSIS — G4733 Obstructive sleep apnea (adult) (pediatric): Secondary | ICD-10-CM | POA: Diagnosis not present

## 2020-12-27 ENCOUNTER — Ambulatory Visit: Payer: Medicare HMO | Admitting: Gastroenterology

## 2021-01-01 DIAGNOSIS — M961 Postlaminectomy syndrome, not elsewhere classified: Secondary | ICD-10-CM | POA: Diagnosis not present

## 2021-01-03 DIAGNOSIS — E7849 Other hyperlipidemia: Secondary | ICD-10-CM | POA: Diagnosis not present

## 2021-01-08 DIAGNOSIS — R748 Abnormal levels of other serum enzymes: Secondary | ICD-10-CM | POA: Diagnosis not present

## 2021-01-08 DIAGNOSIS — Z0000 Encounter for general adult medical examination without abnormal findings: Secondary | ICD-10-CM | POA: Diagnosis not present

## 2021-01-09 DIAGNOSIS — E785 Hyperlipidemia, unspecified: Secondary | ICD-10-CM | POA: Diagnosis not present

## 2021-01-09 DIAGNOSIS — Z23 Encounter for immunization: Secondary | ICD-10-CM | POA: Diagnosis not present

## 2021-01-09 DIAGNOSIS — K219 Gastro-esophageal reflux disease without esophagitis: Secondary | ICD-10-CM | POA: Diagnosis not present

## 2021-01-09 DIAGNOSIS — Z0000 Encounter for general adult medical examination without abnormal findings: Secondary | ICD-10-CM | POA: Diagnosis not present

## 2021-01-12 DIAGNOSIS — G4733 Obstructive sleep apnea (adult) (pediatric): Secondary | ICD-10-CM | POA: Diagnosis not present

## 2021-01-23 DIAGNOSIS — M5459 Other low back pain: Secondary | ICD-10-CM | POA: Diagnosis not present

## 2021-01-24 DIAGNOSIS — Z6827 Body mass index (BMI) 27.0-27.9, adult: Secondary | ICD-10-CM | POA: Diagnosis not present

## 2021-01-24 DIAGNOSIS — Z7722 Contact with and (suspected) exposure to environmental tobacco smoke (acute) (chronic): Secondary | ICD-10-CM | POA: Diagnosis not present

## 2021-01-24 DIAGNOSIS — Z8249 Family history of ischemic heart disease and other diseases of the circulatory system: Secondary | ICD-10-CM | POA: Diagnosis not present

## 2021-01-24 DIAGNOSIS — E663 Overweight: Secondary | ICD-10-CM | POA: Diagnosis not present

## 2021-01-24 DIAGNOSIS — K219 Gastro-esophageal reflux disease without esophagitis: Secondary | ICD-10-CM | POA: Diagnosis not present

## 2021-01-24 DIAGNOSIS — Z87891 Personal history of nicotine dependence: Secondary | ICD-10-CM | POA: Diagnosis not present

## 2021-01-24 DIAGNOSIS — Z7982 Long term (current) use of aspirin: Secondary | ICD-10-CM | POA: Diagnosis not present

## 2021-01-24 DIAGNOSIS — E785 Hyperlipidemia, unspecified: Secondary | ICD-10-CM | POA: Diagnosis not present

## 2021-01-25 DIAGNOSIS — M5459 Other low back pain: Secondary | ICD-10-CM | POA: Diagnosis not present

## 2021-01-29 DIAGNOSIS — M5459 Other low back pain: Secondary | ICD-10-CM | POA: Diagnosis not present

## 2021-02-02 DIAGNOSIS — M5459 Other low back pain: Secondary | ICD-10-CM | POA: Diagnosis not present

## 2021-02-05 DIAGNOSIS — M5459 Other low back pain: Secondary | ICD-10-CM | POA: Diagnosis not present

## 2021-02-08 DIAGNOSIS — M5459 Other low back pain: Secondary | ICD-10-CM | POA: Diagnosis not present

## 2021-02-14 DIAGNOSIS — M5459 Other low back pain: Secondary | ICD-10-CM | POA: Diagnosis not present

## 2021-02-22 DIAGNOSIS — M5459 Other low back pain: Secondary | ICD-10-CM | POA: Diagnosis not present

## 2021-03-05 ENCOUNTER — Other Ambulatory Visit (HOSPITAL_COMMUNITY): Payer: Self-pay | Admitting: Family Medicine

## 2021-03-05 ENCOUNTER — Other Ambulatory Visit: Payer: Self-pay | Admitting: Family Medicine

## 2021-03-05 DIAGNOSIS — M79604 Pain in right leg: Secondary | ICD-10-CM | POA: Diagnosis not present

## 2021-03-05 DIAGNOSIS — S8011XA Contusion of right lower leg, initial encounter: Secondary | ICD-10-CM | POA: Diagnosis not present

## 2021-03-06 ENCOUNTER — Other Ambulatory Visit: Payer: Self-pay

## 2021-03-06 ENCOUNTER — Ambulatory Visit
Admission: RE | Admit: 2021-03-06 | Discharge: 2021-03-06 | Disposition: A | Discharge status: Home / Self Care | Payer: Medicare HMO | Source: Ambulatory Visit | Attending: Family Medicine | Admitting: Family Medicine

## 2021-03-06 DIAGNOSIS — M79604 Pain in right leg: Secondary | ICD-10-CM | POA: Diagnosis not present

## 2021-03-06 DIAGNOSIS — M79661 Pain in right lower leg: Secondary | ICD-10-CM | POA: Diagnosis not present

## 2021-03-07 ENCOUNTER — Other Ambulatory Visit: Payer: Medicare HMO

## 2021-03-21 DIAGNOSIS — G4733 Obstructive sleep apnea (adult) (pediatric): Secondary | ICD-10-CM | POA: Diagnosis not present

## 2021-03-23 DIAGNOSIS — H04123 Dry eye syndrome of bilateral lacrimal glands: Secondary | ICD-10-CM | POA: Diagnosis not present

## 2021-06-20 ENCOUNTER — Ambulatory Visit: Payer: Medicare HMO | Admitting: Surgery

## 2021-06-20 ENCOUNTER — Telehealth: Payer: Self-pay | Admitting: Surgery

## 2021-06-20 ENCOUNTER — Other Ambulatory Visit: Payer: Self-pay

## 2021-06-20 ENCOUNTER — Encounter: Payer: Self-pay | Admitting: Surgery

## 2021-06-20 VITALS — BP 127/79 | HR 73 | Temp 97.8°F | Ht 64.0 in | Wt 172.0 lb

## 2021-06-20 DIAGNOSIS — K432 Incisional hernia without obstruction or gangrene: Secondary | ICD-10-CM | POA: Diagnosis not present

## 2021-06-20 NOTE — Telephone Encounter (Signed)
Outgoing call is made, left message for patient to call, please inform her of the following regarding scheduled surgery:  ? ?Pre-Admission date/time, COVID Testing date and Surgery date. ? ?Surgery Date: 07/19/21 ?Preadmission Testing Date: 07/12/21 (phone 8a-1p) ?Covid Testing Date: Not needed.    ?  ?Also patient will need to call at [phone], between 1-3:00pm the day before surgery, to find out what time to arrive for surgery.   ? ?

## 2021-06-20 NOTE — Patient Instructions (Signed)
You have requested to have a Ventral Hernia Repair. This will be done on 07/19/21 by Dr Dahlia Byes at [facility]. Please see your (BLUE) Pre-care sheet for more information. ?Our surgery scheduler will call you to verify surgery date and to go over information.  ? ?You will need to arrange to be out of work for approximately 1-2 weeks and then you may return with a lifting restriction for 4 more weeks. If you have FMLA or Disability paperwork that needs to be filled out, please have your company fax your paperwork to [phone] or you may drop this by either office. This paperwork will be filled out within 3 days after your surgery has been completed. ? ? ?Ventral Hernia ?A ventral hernia (also called an incisional hernia) is a hernia that occurs at the site of a previous surgical cut (incision) in the abdomen. The abdominal wall spans from your lower chest down to your pelvis. If the abdominal wall is weakened from a surgical incision, a hernia can occur. A hernia is a bulge of bowel or muscle tissue pushing out on the weakened part of the abdominal wall. Ventral hernias can get bigger from straining or lifting. ?Obese and older people are at higher risk for a ventral hernia. People who develop infections after surgery or require repeat incisions at the same site on the abdomen are also at increased risk. ?CAUSES  ?A ventral hernia occurs because of weakness in the abdominal wall at an incision site.  ?SYMPTOMS  ?Common symptoms include: ?A visible bulge or lump on the abdominal wall. ?Pain or tenderness around the lump. ?Increased discomfort if you cough or make a sudden movement. ?If the hernia has blocked part of the intestine, a serious complication can occur (incarcerated or strangulated hernia). This can become a problem that requires emergency surgery because the blood flow to the blocked intestine may be cut off. Symptoms may include: ?Feeling sick to your stomach (nauseous). ?Throwing up (vomiting). ?Stomach  swelling (distention) or bloating. ?Fever. ?Rapid heartbeat. ?DIAGNOSIS  ?Your health care provider will take a medical history and perform a physical exam. Various tests may be ordered, such as: ?Blood tests. ?Urine tests. ?Ultrasonography. ?X-rays. ?Computed tomography (CT). ?TREATMENT  ?Watchful waiting may be all that is needed for a smaller hernia that does not cause symptoms. Your health care provider may recommend the use of a supportive belt (truss) that helps to keep the abdominal wall intact. For larger hernias or those that cause pain, surgery to repair the hernia is usually recommended. If a hernia becomes strangulated, emergency surgery needs to be done right away. ?HOME CARE INSTRUCTIONS ?Avoid putting pressure or strain on the abdominal area. ?Avoid heavy lifting. ?Use good body positioning for physical tasks. Ask your health care provider about proper body positioning. ?Use a supportive belt as directed by your health care provider. ?Maintain a healthy weight. ?Eat foods that are high in fiber, such as whole grains, fruits, and vegetables. Fiber helps prevent difficult bowel movements (constipation). ?Drink enough fluids to keep your urine clear or pale yellow. ?Follow up with your health care provider as directed. ?SEEK MEDICAL CARE IF:  ?Your hernia seems to be getting larger or more painful. ?SEEK IMMEDIATE MEDICAL CARE IF:  ?You have abdominal pain that is sudden and sharp. ?Your pain becomes severe. ?You have repeated vomiting. ?You are sweating a lot. ?You notice a rapid heartbeat. ?You develop a fever. ?MAKE SURE YOU:  ?Understand these instructions. ?Will watch your condition. ?Will get help right  away if you are not doing well or get worse. ?  ?This information is not intended to replace advice given to you by your health care provider. Make sure you discuss any questions you have with your health care provider. ?  ?Document Released: 02/26/2012 Document Revised: 04/01/2014 Document  Reviewed: 02/26/2012 ?Elsevier Interactive Patient Education ?2016 [place]. ? ? ? ?Laparoscopic Ventral Hernia Repair ?Laparoscopic ventral hernia repair is a surgery to fix a ventral hernia. A ventral hernia, also called an incisional hernia, is a bulge of body tissue or intestines that pushes through the front part of the abdomen. This can happen if the connective tissue covering the muscles over the abdomen has a weak spot or is torn because of a surgical cut (incision) from a previous surgery. Laparoscopic ventral hernia repair is often done soon after diagnosis to stop the hernia from getting bigger, becoming uncomfortable, or becoming an emergency. This surgery usually takes about 2 hours, but the time can vary greatly. ?LET [facility] CARE PROVIDER KNOW ABOUT: ?Any allergies you have. ?All medicines you are taking, including steroids, vitamins, herbs, eye drops, creams, and over-the-counter medicines. ?Previous problems you or members of your family have had with the use of anesthetics. ?Any blood disorders you have. ?Previous surgeries you have had. ?Medical conditions you have. ?RISKS AND COMPLICATIONS  ?Generally, laparoscopic ventral hernia repair is a safe procedure. However, as with any surgical procedure, problems can occur. Possible problems include: ?Bleeding. ?Trouble passing urine or having a bowel movement after the surgery. ?Infection. ?Pneumonia. ?Blood clots. ?Pain in the area of the hernia. ?A bulge in the area of the hernia that may be caused by a collection of fluid. ?Injury to intestines or other structures in the abdomen. ?Return of the hernia after surgery. ?In some cases, your health care provider may need to stop the laparoscopic procedure and do regular, open surgery. This may be necessary for very difficult hernias, when organs are hard to see, or when bleeding problems occur during surgery. ?BEFORE THE PROCEDURE  ?You may need to have blood tests, urine tests, a chest X-ray, or  an electrocardiogram done before the day of the surgery. ?Ask your health care provider about changing or stopping your regular medicines. This is especially important if you are taking diabetes medicines or blood thinners. ?You may need to wash with a special type of germ-killing soap. ?Do not eat or drink anything after midnight the night before the procedure or as directed by your health care provider. ?Make plans to have someone drive you home after the procedure. ?PROCEDURE  ?Small monitors will be put on your body. They are used to check your heart, blood pressure, and oxygen level. ?An IV access tube will be put into a vein in your hand or arm. Fluids and medicine will flow directly into your body through the IV tube. ?You will be given medicine that makes you go to sleep (general anesthetic). ?Your abdomen will be cleaned with a special soap to kill any germs on your skin. ?Once you are asleep, several small incisions will be made in your abdomen. ?The large space in your abdomen will be filled with air so that it expands. This gives your health care provider more room and a better view. ?A thin, lighted tube with a tiny camera on the end (laparoscope) is put through a small incision in your abdomen. The camera on the laparoscope sends a picture to a TV screen in the operating room. This gives your  health care provider a good view inside your abdomen. ?Hollow tubes are put through the other small incisions in your abdomen. The tools needed for the procedure are put through these tubes. ?Your health care provider puts the tissue or intestines that formed the hernia back in place. ?A screen-like patch (mesh) is used to close the hernia. This helps make the area stronger. Stitches, tacks, or staples are used to keep the mesh in place. ?Medicine and a bandage (dressing) or skin glue will be put over the incisions. ?AFTER THE PROCEDURE  ?You will stay in a recovery area until the anesthetic wears off. Your blood  pressure and pulse will be checked often. ?You may be able to go home the same day or may need to stay in the hospital for 1-2 days after surgery. Your health care provider will decide when you can go home. ?You

## 2021-06-20 NOTE — Telephone Encounter (Signed)
Patient calls back, she is now informed of all dates regarding her surgery and verbalized understanding.   

## 2021-06-22 NOTE — Progress Notes (Signed)
Outpatient Surgical Follow Up ? ?06/22/2021 ? ?Elizabeth Crawford is an 76 y.o. female.  ? ?Chief Complaint  ?Patient presents with  ? Follow-up  ? ? ?HPI: Elizabeth Crawford is a 76 year old female very well-known to me with a prior history of symptomatic paraesophageal hernia that underwent a repair by me a couple years ago.  She then had a slipped Nissen and recurrent hernia and I went back and we did that repair.  She is doing well.  Specifically for her pulmonary perspective her symptoms have resolved.  She continues to lose weight and she is very happy.  More recently she has felt a lump around the incision site.  Reports that gets worsening with Valsalva.  Does have intermittent abdominal pain that is dull and mild.  She is otherwise doing okay.  Sometimes she has some nausea related to meals but overall her condition has improved. ? ?Past Medical History:  ?Diagnosis Date  ? Allergy   ? Arthritis   ? Complication of anesthesia   ? DVT (deep venous thrombosis) ([place])   ? GERD (gastroesophageal reflux disease)   ? H/O seasonal allergies   ? Hypercholesteremia   ? PONV (postoperative nausea and vomiting)   ? Gets sick every time  ? Pulmonary embolism ([place])   ? Sleep apnea   ? Spondylolisthesis   ? ? ?Past Surgical History:  ?Procedure Laterality Date  ? BACK SURGERY    ? x2  ? BLADDER SUSPENSION  2009  ? BREAST REDUCTION SURGERY Bilateral 03/01/2019  ? Procedure: BILATERAL BREAST REDUCTION;  Surgeon: Wallace Going, DO;  Location: [facility];  Service: Plastics;  Laterality: Bilateral;  total case 4 hours  ? CARPAL TUNNEL RELEASE  2003  ? Left hand  ? CHOLECYSTECTOMY  2000  ? Laparoscopic  ? COLONOSCOPY W/ POLYPECTOMY  2014  ? COLONOSCOPY WITH PROPOFOL N/A 05/22/2017  ? Procedure: COLONOSCOPY WITH PROPOFOL;  Surgeon: Lollie Sails, MD;  Location: [facility] ENDOSCOPY;  Service: Endoscopy;  Laterality: N/A;  ? DECOMPRESSIVE LUMBAR LAMINECTOMY LEVEL 2 N/A 11/09/2015  ? Procedure: DECOMPRESSION L2-5 REMOVAL PEDICULE SCREWS L4-5;   Surgeon: Melina Schools, MD;  Location: [place];  Service: Orthopedics;  Laterality: N/A;  ? ESOPHAGOGASTRODUODENOSCOPY (EGD) WITH PROPOFOL N/A 10/09/2018  ? Procedure: ESOPHAGOGASTRODUODENOSCOPY (EGD) WITH PROPOFOL;  Surgeon: Lollie Sails, MD;  Location: [facility] ENDOSCOPY;  Service: Endoscopy;  Laterality: N/A;  ? ESOPHAGOGASTRODUODENOSCOPY (EGD) WITH PROPOFOL N/A 05/16/2020  ? Procedure: ESOPHAGOGASTRODUODENOSCOPY (EGD) WITH PROPOFOL;  Surgeon: Jonathon Bellows, MD;  Location: [facility] ENDOSCOPY;  Service: Gastroenterology;  Laterality: N/A;  ? EYE SURGERY Bilateral 2005  ? cataract surgery   ? HARDWARE REMOVAL N/A 11/09/2015  ? Procedure: HARDWARE REMOVAL;  Surgeon: Melina Schools, MD;  Location: [place];  Service: Orthopedics;  Laterality: N/A;  ? [place]  ? IVC filter placement  02-01-10  ? REDUCTION MAMMAPLASTY Bilateral 03/01/2019  ? SPINE SURGERY  5 23-12  ? anterolateral fusion/ rodding  ? TONSILLECTOMY    ? TUBAL LIGATION  1977  ? XI ROBOTIC ASSISTED HIATAL HERNIA REPAIR N/A 05/23/2020  ? Procedure: XI ROBOTIC ASSISTED HIATAL HERNIA REPAIR with Armida Sans, RNFA to assist;  Surgeon: Jules Husbands, MD;  Location: [facility] ORS;  Service: General;  Laterality: N/A;  Provider requesting 3 hours /180 minutes for procedure  ? ? ?Family History  ?Problem Relation Age of Onset  ? COPD Mother   ? Heart disease Mother   ? Aortic aneurysm Mother   ? Heart disease Father   ? ? ?  Social History:  reports that she quit smoking about 45 years ago. Her smoking use included cigarettes. She has been exposed to tobacco smoke. She has never used smokeless tobacco. She reports current alcohol use. She reports that she does not use drugs. ? ?Allergies:  ?Allergies  ?Allergen Reactions  ? Tape   ?  Paper and adhesive tape make blisters and redness and difficult to remove even on the first day  ? Elemental Sulfur Nausea And Vomiting  ? Methylprednisolone Palpitations  ? Sulfasalazine Nausea Only  ? ? ?Medications  reviewed. ? ? ? ?ROS ?Full ROS performed and is otherwise negative other than what is stated in HPI ? ? ?BP 127/79   Pulse 73   Temp 97.8 ?F (36.6 ?C)   Ht '5\' 4"'$  (1.626 m)   Wt 172 lb (78 kg)   SpO2 (!) 73%   BMI 29.52 kg/m?  ? ?Physical Exam ?Vitals and nursing note reviewed. Exam conducted with a chaperone present.  ?Constitutional:   ?   General: She is not in acute distress. ?   Appearance: Normal appearance. She is normal weight.  ?Eyes:  ?   General: No scleral icterus.    ?   Right eye: No discharge.     ?   Left eye: No discharge.  ?Cardiovascular:  ?   Rate and Rhythm: Normal rate and regular rhythm.  ?   Heart sounds: No murmur heard. ?Pulmonary:  ?   Effort: Pulmonary effort is normal. No respiratory distress.  ?   Breath sounds: Normal breath sounds. No stridor. No wheezing or rhonchi.  ?Abdominal:  ?   General: Abdomen is flat. There is no distension.  ?   Palpations: Abdomen is soft. There is no mass.  ?   Tenderness: There is no abdominal tenderness. There is no guarding or rebound.  ?   Hernia: A hernia is present.  ?   Comments: Incisional ventral hernia measuring 3 cm  ?Musculoskeletal:     ?   General: No swelling or tenderness. Normal range of motion.  ?   Cervical back: Normal range of motion and neck supple. No rigidity or tenderness.  ?Lymphadenopathy:  ?   Cervical: No cervical adenopathy.  ?Skin: ?   General: Skin is warm and dry.  ?   Capillary Refill: Capillary refill takes less than 2 seconds.  ?   Coloration: Skin is not jaundiced or pale.  ?Neurological:  ?   General: No focal deficit present.  ?   Mental Status: She is alert and oriented to person, place, and time.  ?Psychiatric:     ?   Mood and Affect: Mood normal.     ?   Behavior: Behavior normal.     ?   Thought Content: Thought content normal.     ?   Judgment: Judgment normal.  ? ? ?Assessment/Plan: ?76 year old female with initial incisional hernia that is symptomatic.  Discussed with the patient in detail and her husband  about her disease process.  I definitely recommend repair.  Given the size I do think that it will be feasible to perform open repair with mesh placement.  Procedure discussed with the patient in detail.  Risk, benefits and possible complications including but not limited to: Bleeding, infection, mesh issues, recurrence, chronic pain.  She understands and wished to proceed.  We will schedule her for the surgery at her convenience. ?Please note I spent 40 minutes in this encounter including reviewing medical records, counseling the  patient and the family, placing orders, coordinating her care and performing appropriate documentation. ? ?Caroleen Hamman, MD FACS ?General Surgeon  ?

## 2021-06-22 NOTE — H&P (View-Only) (Signed)
Outpatient Surgical Follow Up ? ?06/22/2021 ? ?Elizabeth Crawford is an 76 y.o. female.  ? ?Chief Complaint  ?Patient presents with  ? Follow-up  ? ? ?HPI: Elizabeth Crawford is a 76 year old female very well-known to me with a prior history of symptomatic paraesophageal hernia that underwent a repair by me a couple years ago.  She then had a slipped Nissen and recurrent hernia and I went back and we did that repair.  She is doing well.  Specifically for her pulmonary perspective her symptoms have resolved.  She continues to lose weight and she is very happy.  More recently she has felt a lump around the incision site.  Reports that gets worsening with Valsalva.  Does have intermittent abdominal pain that is dull and mild.  She is otherwise doing okay.  Sometimes she has some nausea related to meals but overall her condition has improved. ? ?Past Medical History:  ?Diagnosis Date  ? Allergy   ? Arthritis   ? Complication of anesthesia   ? DVT (deep venous thrombosis) ([place])   ? GERD (gastroesophageal reflux disease)   ? H/O seasonal allergies   ? Hypercholesteremia   ? PONV (postoperative nausea and vomiting)   ? Gets sick every time  ? Pulmonary embolism ([place])   ? Sleep apnea   ? Spondylolisthesis   ? ? ?Past Surgical History:  ?Procedure Laterality Date  ? BACK SURGERY    ? x2  ? BLADDER SUSPENSION  2009  ? BREAST REDUCTION SURGERY Bilateral 03/01/2019  ? Procedure: BILATERAL BREAST REDUCTION;  Surgeon: Wallace Going, DO;  Location: [place];  Service: Plastics;  Laterality: Bilateral;  total case 4 hours  ? CARPAL TUNNEL RELEASE  2003  ? Left hand  ? CHOLECYSTECTOMY  2000  ? Laparoscopic  ? COLONOSCOPY W/ POLYPECTOMY  2014  ? COLONOSCOPY WITH PROPOFOL N/A 05/22/2017  ? Procedure: COLONOSCOPY WITH PROPOFOL;  Surgeon: Lollie Sails, MD;  [facility] ENDOSCOPY;  Service: Endoscopy;  Laterality: N/A;  ? DECOMPRESSIVE LUMBAR LAMINECTOMY LEVEL 2 N/A 11/09/2015  ? Procedure: DECOMPRESSION L2-5 REMOVAL PEDICULE SCREWS L4-5;   Surgeon: Melina Schools, MD;  Location: [place];  Service: Orthopedics;  Laterality: N/A;  ? ESOPHAGOGASTRODUODENOSCOPY (EGD) WITH PROPOFOL N/A 10/09/2018  ? Procedure: ESOPHAGOGASTRODUODENOSCOPY (EGD) WITH PROPOFOL;  Surgeon: Lollie Sails, MD;  Location: [facility] ENDOSCOPY;  Service: Endoscopy;  Laterality: N/A;  ? ESOPHAGOGASTRODUODENOSCOPY (EGD) WITH PROPOFOL N/A 05/16/2020  ? Procedure: ESOPHAGOGASTRODUODENOSCOPY (EGD) WITH PROPOFOL;  Surgeon: Jonathon Bellows, MD;  Location: [facility] ENDOSCOPY;  Service: Gastroenterology;  Laterality: N/A;  ? EYE SURGERY Bilateral 2005  ? cataract surgery   ? HARDWARE REMOVAL N/A 11/09/2015  ? Procedure: HARDWARE REMOVAL;  Surgeon: Melina Schools, MD;  Location: [place];  Service: Orthopedics;  Laterality: N/A;  ? [place]  ? IVC filter placement  02-01-10  ? REDUCTION MAMMAPLASTY Bilateral 03/01/2019  ? SPINE SURGERY  5 23-12  ? anterolateral fusion/ rodding  ? TONSILLECTOMY    ? TUBAL LIGATION  1977  ? XI ROBOTIC ASSISTED HIATAL HERNIA REPAIR N/A 05/23/2020  ? Procedure: XI ROBOTIC ASSISTED HIATAL HERNIA REPAIR with Armida Sans, RNFA to assist;  Surgeon: Jules Husbands, MD;  Location: [facility] ORS;  Service: General;  Laterality: N/A;  Provider requesting 3 hours /180 minutes for procedure  ? ? ?Family History  ?Problem Relation Age of Onset  ? COPD Mother   ? Heart disease Mother   ? Aortic aneurysm Mother   ? Heart disease Father   ? ? ?  Social History:  reports that she quit smoking about 45 years ago. Her smoking use included cigarettes. She has been exposed to tobacco smoke. She has never used smokeless tobacco. She reports current alcohol use. She reports that she does not use drugs. ? ?Allergies:  ?Allergies  ?Allergen Reactions  ? Tape   ?  Paper and adhesive tape make blisters and redness and difficult to remove even on the first day  ? Elemental Sulfur Nausea And Vomiting  ? Methylprednisolone Palpitations  ? Sulfasalazine Nausea Only  ? ? ?Medications  reviewed. ? ? ? ?ROS ?Full ROS performed and is otherwise negative other than what is stated in HPI ? ? ?BP 127/79   Pulse 73   Temp 97.8 ?F (36.6 ?C)   Ht '5\' 4"'$  (1.626 m)   Wt 172 lb (78 kg)   SpO2 (!) 73%   BMI 29.52 kg/m?  ? ?Physical Exam ?Vitals and nursing note reviewed. Exam conducted with a chaperone present.  ?Constitutional:   ?   General: She is not in acute distress. ?   Appearance: Normal appearance. She is normal weight.  ?Eyes:  ?   General: No scleral icterus.    ?   Right eye: No discharge.     ?   Left eye: No discharge.  ?Cardiovascular:  ?   Rate and Rhythm: Normal rate and regular rhythm.  ?   Heart sounds: No murmur heard. ?Pulmonary:  ?   Effort: Pulmonary effort is normal. No respiratory distress.  ?   Breath sounds: Normal breath sounds. No stridor. No wheezing or rhonchi.  ?Abdominal:  ?   General: Abdomen is flat. There is no distension.  ?   Palpations: Abdomen is soft. There is no mass.  ?   Tenderness: There is no abdominal tenderness. There is no guarding or rebound.  ?   Hernia: A hernia is present.  ?   Comments: Incisional ventral hernia measuring 3 cm  ?Musculoskeletal:     ?   General: No swelling or tenderness. Normal range of motion.  ?   Cervical back: Normal range of motion and neck supple. No rigidity or tenderness.  ?Lymphadenopathy:  ?   Cervical: No cervical adenopathy.  ?Skin: ?   General: Skin is warm and dry.  ?   Capillary Refill: Capillary refill takes less than 2 seconds.  ?   Coloration: Skin is not jaundiced or pale.  ?Neurological:  ?   General: No focal deficit present.  ?   Mental Status: She is alert and oriented to person, place, and time.  ?Psychiatric:     ?   Mood and Affect: Mood normal.     ?   Behavior: Behavior normal.     ?   Thought Content: Thought content normal.     ?   Judgment: Judgment normal.  ? ? ?Assessment/Plan: ?76 year old female with initial incisional hernia that is symptomatic.  Discussed with the patient in detail and her husband  about her disease process.  I definitely recommend repair.  Given the size I do think that it will be feasible to perform open repair with mesh placement.  Procedure discussed with the patient in detail.  Risk, benefits and possible complications including but not limited to: Bleeding, infection, mesh issues, recurrence, chronic pain.  She understands and wished to proceed.  We will schedule her for the surgery at her convenience. ?Please note I spent 40 minutes in this encounter including reviewing medical records, counseling the  patient and the family, placing orders, coordinating her care and performing appropriate documentation. ? ?Caroleen Hamman, MD FACS ?General Surgeon  ?

## 2021-07-12 ENCOUNTER — Other Ambulatory Visit
Admission: RE | Admit: 2021-07-12 | Discharge: 2021-07-12 | Disposition: A | Discharge status: Home / Self Care | Payer: Medicare HMO | Source: Ambulatory Visit | Attending: Surgery | Admitting: Surgery

## 2021-07-12 ENCOUNTER — Other Ambulatory Visit: Payer: Self-pay

## 2021-07-12 DIAGNOSIS — Z01812 Encounter for preprocedural laboratory examination: Secondary | ICD-10-CM

## 2021-07-12 DIAGNOSIS — Z01818 Encounter for other preprocedural examination: Secondary | ICD-10-CM | POA: Diagnosis present

## 2021-07-12 HISTORY — DX: Personal history of other diseases of the digestive system: Z87.19

## 2021-07-12 LAB — BASIC METABOLIC PANEL
Anion gap: 6 (ref 5–15)
BUN: 13 mg/dL (ref 8–23)
CO2: 26 mmol/L (ref 22–32)
Calcium: 9.2 mg/dL (ref 8.9–10.3)
Chloride: 106 mmol/L (ref 98–111)
Creatinine, Ser: 0.82 mg/dL (ref 0.44–1.00)
GFR, Estimated: >60 mL/min (ref >60)
Glucose, Bld: 78 mg/dL (ref 70–99)
Potassium: 3.6 mmol/L (ref 3.5–5.1)
Sodium: 138 mmol/L (ref 135–145)

## 2021-07-12 LAB — CBC
HCT: 37.6 % (ref 36.0–46.0)
Hemoglobin: 12.1 g/dL (ref 12.0–15.0)
MCH: 30.6 pg (ref 26.0–34.0)
MCHC: 32.2 g/dL (ref 30.0–36.0)
MCV: 94.9 fL (ref 80.0–100.0)
Platelets: 129 10*3/uL — ABNORMAL LOW (ref 150–400)
RBC: 3.96 MIL/uL (ref 3.87–5.11)
RDW: 12.9 % (ref 11.5–15.5)
WBC: 5.9 10*3/uL (ref 4.0–10.5)
nRBC: 0 % (ref 0.0–0.2)

## 2021-07-12 NOTE — Patient Instructions (Addendum)
Your procedure is scheduled on: 07/19/21 - Thursday ?Report to the Registration Desk on the 1st floor of the [facility]. ?To find out your arrival time, please call [phone] between 1PM - 3PM on: 07/18/21 - Wednesday ? ?REMEMBER: ?Instructions that are not followed completely may result in serious medical risk, up to and including death; or upon the discretion of your surgeon and anesthesiologist your surgery may need to be rescheduled. ? ?Do not eat food after midnight the night before surgery.  ?No gum chewing, lozengers or hard candies. ? ?You may however, drink CLEAR liquids up to 2 hours before you are scheduled to arrive for your surgery. Do not drink anything within 2 hours of your scheduled arrival time. ? ?Clear liquids include: ?- water  ?- apple juice without pulp ?- gatorade (not RED colors) ?- black coffee or tea (Do NOT add milk or creamers to the coffee or tea) ?Do NOT drink anything that is not on this list. ? ?TAKE THESE MEDICATIONS THE MORNING OF SURGERY WITH A SIP OF WATER: ? ?- omeprazole (PRILOSEC) 40 MG capsule, (take one the night before and one on the morning of surgery - helps to prevent nausea after surgery.) ? ?Follow recommendations from Cardiologist, Pulmonologist or PCP regarding stopping Aspirin, Coumadin, Plavix, Eliquis, Pradaxa, or Pletal. Stop your Aspirin 81 mg beginning 07/13/21. ? ?Stop taking One week prior to surgery: ?Stop Anti-inflammatories (NSAIDS) such as Advil, Aleve, Ibuprofen, Motrin, Naproxen, Naprosyn and Aspirin based products such as Excedrin, [place]ys Powder, BC Powder. ? ?Stop ANY OVER THE COUNTER supplements until after surgery. ? ?You may take Tylenol if needed for pain up until the day of surgery. ? ?No Alcohol for 24 hours before or after surgery. ? ?No Smoking including e-cigarettes for 24 hours prior to surgery.  ?No chewable tobacco products for at least 6 hours prior to surgery.  ?No nicotine patches on the day of surgery. ? ?Do not use any  "recreational" drugs for at least a week prior to your surgery.  ?Please be advised that the combination of cocaine and anesthesia may have negative outcomes, up to and including death. ?If you test positive for cocaine, your surgery will be cancelled. ? ?On the morning of surgery brush your teeth with toothpaste and water, you may rinse your mouth with mouthwash if you wish. ?Do not swallow any toothpaste or mouthwash. ? ?Use CHG Soap or wipes as directed on instruction sheet. ? ?Do not wear jewelry, make-up, hairpins, clips or nail polish. ? ?Do not wear lotions, powders, or perfumes.  ? ?Do not shave body from the neck down 48 hours prior to surgery just in case you cut yourself which could leave a site for infection.  ?Also, freshly shaved skin may become irritated if using the CHG soap. ? ?Contact lenses, hearing aids and dentures may not be worn into surgery. ? ?Do not bring valuables to the hospital. [facility] is not responsible for any missing/lost belongings or valuables.  ? ?Bring your C-PAP to the hospital with you in case you may have to spend the night.  ? ?Notify your doctor if there is any change in your medical condition (cold, fever, infection). ? ?Wear comfortable clothing (specific to your surgery type) to the hospital. ? ?After surgery, you can help prevent lung complications by doing breathing exercises.  ?Take deep breaths and cough every 1-2 hours. Your doctor may order a device called an Incentive Spirometer to help you take deep breaths. ?When coughing or sneezing,  hold a pillow firmly against your incision with both hands. This is called ?splinting.? Doing this helps protect your incision. It also decreases belly discomfort. ? ?If you are being admitted to the hospital overnight, leave your suitcase in the car. ?After surgery it may be brought to your room. ? ?If you are being discharged the day of surgery, you will not be allowed to drive home. ?You will need a responsible adult (18  years or older) to drive you home and stay with you that night.  ? ?If you are taking public transportation, you will need to have a responsible adult (18 years or older) with you. ?Please confirm with your physician that it is acceptable to use public transportation.  ? ?Please call the [facility] at [phone] if you have any questions about these instructions. ? ?Surgery Visitation Policy: ? ?Patients undergoing a surgery or procedure may have two family members or support persons with them as long as the person is not COVID-19 positive or experiencing its symptoms.  ? ?Inpatient Visitation:   ? ?Visiting hours are 7 a.m. to 8 p.m. ?Up to four visitors are allowed at one time in a patient room, including children. The visitors may rotate out with other people during the day. One designated support person (adult) may remain overnight.  ?

## 2021-07-18 MED ORDER — CEFAZOLIN SODIUM-DEXTROSE 2-4 GM/100ML-% IV SOLN
2.0000 g | INTRAVENOUS | Status: AC
  Administered 2021-07-19: 2 g via INTRAVENOUS

## 2021-07-18 MED ORDER — ACETAMINOPHEN 500 MG PO TABS
1000.0000 mg | ORAL_TABLET | ORAL | Status: AC
  Administered 2021-07-19: 1000 mg via ORAL

## 2021-07-18 MED ORDER — APREPITANT 40 MG PO CAPS: 40.0000 mg | ORAL_CAPSULE | Freq: Once | ORAL | Status: AC

## 2021-07-18 MED ORDER — CELECOXIB 200 MG PO CAPS: 200.0000 mg | ORAL_CAPSULE | ORAL | Status: AC

## 2021-07-18 MED ORDER — ORAL CARE MOUTH RINSE: 15.0000 mL | Freq: Once | OROMUCOSAL | Status: AC

## 2021-07-18 MED ORDER — CHLORHEXIDINE GLUCONATE 0.12 % MT SOLN: 15.0000 mL | Freq: Once | OROMUCOSAL | Status: AC

## 2021-07-18 MED ORDER — CHLORHEXIDINE GLUCONATE CLOTH 2 % EX PADS: 6.0000 | MEDICATED_PAD | Freq: Once | CUTANEOUS | Status: DC

## 2021-07-18 MED ORDER — GABAPENTIN 300 MG PO CAPS: 300.0000 mg | ORAL_CAPSULE | ORAL | Status: AC

## 2021-07-18 MED ORDER — LACTATED RINGERS IV SOLN
INTRAVENOUS | Status: DC
Start: 2021-07-18 — End: 2021-07-19

## 2021-07-19 ENCOUNTER — Encounter: Payer: Self-pay | Admitting: Surgery

## 2021-07-19 ENCOUNTER — Ambulatory Visit: Payer: Medicare HMO | Admitting: Certified Registered"

## 2021-07-19 ENCOUNTER — Encounter
Admission: RE | Disposition: A | Discharge status: Home / Self Care | Payer: Self-pay | Source: Home / Self Care | Attending: Surgery

## 2021-07-19 ENCOUNTER — Other Ambulatory Visit: Payer: Self-pay

## 2021-07-19 ENCOUNTER — Ambulatory Visit
Admission: RE | Admit: 2021-07-19 | Discharge: 2021-07-19 | Disposition: A | Discharge status: Home / Self Care | Payer: Medicare HMO | Attending: Surgery | Admitting: Surgery

## 2021-07-19 DIAGNOSIS — K432 Incisional hernia without obstruction or gangrene: Secondary | ICD-10-CM | POA: Diagnosis present

## 2021-07-19 DIAGNOSIS — R109 Unspecified abdominal pain: Secondary | ICD-10-CM | POA: Insufficient documentation

## 2021-07-19 DIAGNOSIS — Z7722 Contact with and (suspected) exposure to environmental tobacco smoke (acute) (chronic): Secondary | ICD-10-CM | POA: Insufficient documentation

## 2021-07-19 DIAGNOSIS — Z79899 Other long term (current) drug therapy: Secondary | ICD-10-CM | POA: Diagnosis not present

## 2021-07-19 DIAGNOSIS — G4730 Sleep apnea, unspecified: Secondary | ICD-10-CM | POA: Diagnosis not present

## 2021-07-19 DIAGNOSIS — M4310 Spondylolisthesis, site unspecified: Secondary | ICD-10-CM | POA: Insufficient documentation

## 2021-07-19 DIAGNOSIS — Z87891 Personal history of nicotine dependence: Secondary | ICD-10-CM | POA: Insufficient documentation

## 2021-07-19 DIAGNOSIS — Z9049 Acquired absence of other specified parts of digestive tract: Secondary | ICD-10-CM | POA: Diagnosis not present

## 2021-07-19 DIAGNOSIS — Z8719 Personal history of other diseases of the digestive system: Secondary | ICD-10-CM | POA: Insufficient documentation

## 2021-07-19 DIAGNOSIS — Z86711 Personal history of pulmonary embolism: Secondary | ICD-10-CM | POA: Insufficient documentation

## 2021-07-19 DIAGNOSIS — R110 Nausea: Secondary | ICD-10-CM | POA: Diagnosis not present

## 2021-07-19 DIAGNOSIS — Z86718 Personal history of other venous thrombosis and embolism: Secondary | ICD-10-CM | POA: Diagnosis not present

## 2021-07-19 DIAGNOSIS — E7800 Pure hypercholesterolemia, unspecified: Secondary | ICD-10-CM | POA: Insufficient documentation

## 2021-07-19 DIAGNOSIS — K219 Gastro-esophageal reflux disease without esophagitis: Secondary | ICD-10-CM | POA: Insufficient documentation

## 2021-07-19 DIAGNOSIS — Z9889 Other specified postprocedural states: Secondary | ICD-10-CM | POA: Insufficient documentation

## 2021-07-19 HISTORY — PX: INSERTION OF MESH: SHX5868

## 2021-07-19 HISTORY — PX: VENTRAL HERNIA REPAIR: SHX424

## 2021-07-19 SURGERY — REPAIR, HERNIA, VENTRAL
Anesthesia: General

## 2021-07-19 MED ORDER — ROCURONIUM BROMIDE 100 MG/10ML IV SOLN
INTRAVENOUS | Status: DC | PRN
  Administered 2021-07-19: 40 mg via INTRAVENOUS
  Administered 2021-07-19: 10 mg via INTRAVENOUS

## 2021-07-19 MED ORDER — CEFAZOLIN SODIUM-DEXTROSE 2-4 GM/100ML-% IV SOLN
INTRAVENOUS | Status: AC
  Filled 2021-07-19: qty 100

## 2021-07-19 MED ORDER — SUCCINYLCHOLINE CHLORIDE 200 MG/10ML IV SOSY
PREFILLED_SYRINGE | INTRAVENOUS | Status: DC | PRN
  Administered 2021-07-19: 100 mg via INTRAVENOUS

## 2021-07-19 MED ORDER — PROPOFOL 10 MG/ML IV BOLUS
INTRAVENOUS | Status: DC | PRN
  Administered 2021-07-19: 100 mg via INTRAVENOUS

## 2021-07-19 MED ORDER — SUGAMMADEX SODIUM 200 MG/2ML IV SOLN
INTRAVENOUS | Status: DC | PRN
  Administered 2021-07-19: 150 mg via INTRAVENOUS

## 2021-07-19 MED ORDER — CELECOXIB 200 MG PO CAPS
ORAL_CAPSULE | ORAL | Status: AC
  Administered 2021-07-19: 200 mg via ORAL
  Filled 2021-07-19: qty 1

## 2021-07-19 MED ORDER — LIDOCAINE HCL (CARDIAC) PF 100 MG/5ML IV SOSY
PREFILLED_SYRINGE | INTRAVENOUS | Status: DC | PRN
  Administered 2021-07-19: 100 mg via INTRAVENOUS

## 2021-07-19 MED ORDER — ONDANSETRON HCL 4 MG/2ML IJ SOLN
INTRAMUSCULAR | Status: AC
  Filled 2021-07-19: qty 2

## 2021-07-19 MED ORDER — APREPITANT 40 MG PO CAPS
ORAL_CAPSULE | ORAL | Status: AC
  Administered 2021-07-19: 40 mg via ORAL
  Filled 2021-07-19: qty 1

## 2021-07-19 MED ORDER — ONDANSETRON HCL 4 MG/2ML IJ SOLN
4.0000 mg | Freq: Once | INTRAMUSCULAR | Status: AC | PRN
  Administered 2021-07-19: 4 mg via INTRAVENOUS

## 2021-07-19 MED ORDER — GABAPENTIN 300 MG PO CAPS
ORAL_CAPSULE | ORAL | Status: AC
  Administered 2021-07-19: 300 mg via ORAL
  Filled 2021-07-19: qty 1

## 2021-07-19 MED ORDER — FENTANYL CITRATE (PF) 100 MCG/2ML IJ SOLN
INTRAMUSCULAR | Status: DC | PRN
  Administered 2021-07-19 (×2): 50 ug via INTRAVENOUS

## 2021-07-19 MED ORDER — ONDANSETRON HCL 4 MG/2ML IJ SOLN
INTRAMUSCULAR | Status: DC | PRN
  Administered 2021-07-19: 4 mg via INTRAVENOUS

## 2021-07-19 MED ORDER — CHLORHEXIDINE GLUCONATE 0.12 % MT SOLN
OROMUCOSAL | Status: AC
  Administered 2021-07-19: 15 mL via OROMUCOSAL
  Filled 2021-07-19: qty 15

## 2021-07-19 MED ORDER — FENTANYL CITRATE (PF) 100 MCG/2ML IJ SOLN
25.0000 ug | INTRAMUSCULAR | Status: DC | PRN
  Administered 2021-07-19 (×4): 25 ug via INTRAVENOUS

## 2021-07-19 MED ORDER — PROPOFOL 500 MG/50ML IV EMUL
INTRAVENOUS | Status: AC
  Filled 2021-07-19: qty 50

## 2021-07-19 MED ORDER — BUPIVACAINE LIPOSOME 1.3 % IJ SUSP
INTRAMUSCULAR | Status: AC
  Filled 2021-07-19: qty 20

## 2021-07-19 MED ORDER — BUPIVACAINE-EPINEPHRINE (PF) 0.25% -1:200000 IJ SOLN
INTRAMUSCULAR | Status: DC | PRN
  Administered 2021-07-19: 30 mL

## 2021-07-19 MED ORDER — BUPIVACAINE LIPOSOME 1.3 % IJ SUSP
INTRAMUSCULAR | Status: DC | PRN
  Administered 2021-07-19: 20 mL

## 2021-07-19 MED ORDER — PHENYLEPHRINE 80 MCG/ML (10ML) SYRINGE FOR IV PUSH (FOR BLOOD PRESSURE SUPPORT)
PREFILLED_SYRINGE | INTRAVENOUS | Status: DC | PRN
  Administered 2021-07-19: 160 ug via INTRAVENOUS

## 2021-07-19 MED ORDER — FENTANYL CITRATE (PF) 100 MCG/2ML IJ SOLN
INTRAMUSCULAR | Status: AC
  Filled 2021-07-19: qty 2

## 2021-07-19 MED ORDER — DEXAMETHASONE SODIUM PHOSPHATE 10 MG/ML IJ SOLN
INTRAMUSCULAR | Status: DC | PRN
  Administered 2021-07-19: 10 mg via INTRAVENOUS

## 2021-07-19 MED ORDER — LACTATED RINGERS IV SOLN: INTRAVENOUS | Status: DC | PRN

## 2021-07-19 MED ORDER — HYDROCODONE-ACETAMINOPHEN 5-325 MG PO TABS: 1.0000 | ORAL_TABLET | Freq: Four times a day (QID) | ORAL | 0 refills | Status: DC | PRN

## 2021-07-19 MED ORDER — ACETAMINOPHEN 500 MG PO TABS
ORAL_TABLET | ORAL | Status: AC
  Filled 2021-07-19: qty 2

## 2021-07-19 MED ORDER — BUPIVACAINE-EPINEPHRINE (PF) 0.25% -1:200000 IJ SOLN
INTRAMUSCULAR | Status: AC
  Filled 2021-07-19: qty 30

## 2021-07-19 SURGICAL SUPPLY — 37 items
APL PRP STRL LF DISP 70% ISPRP (MISCELLANEOUS) ×2
APPLIER CLIP 11 MED OPEN (CLIP)
APPLIER CLIP 13 LRG OPEN (CLIP)
APR CLP LRG 13 20 CLIP (CLIP)
APR CLP MED 11 20 MLT OPN (CLIP)
BINDER ABDOMINAL  9 SM 30-45 (SOFTGOODS) ×3
BINDER ABDOMINAL 9 SM 30-45 (SOFTGOODS) IMPLANT
BLADE CLIPPER SURG (BLADE) ×2 IMPLANT
CHLORAPREP W/TINT 26 (MISCELLANEOUS) ×3 IMPLANT
CLIP APPLIE 11 MED OPEN (CLIP) ×2 IMPLANT
CLIP APPLIE 13 LRG OPEN (CLIP) ×2 IMPLANT
DRAPE LAPAROTOMY 100X77 ABD (DRAPES) ×3 IMPLANT
DRSG TELFA 3X8 NADH (GAUZE/BANDAGES/DRESSINGS) IMPLANT
ELECT CAUTERY BLADE 6.4 (BLADE) ×3 IMPLANT
ELECT REM PT RETURN 9FT ADLT (ELECTROSURGICAL) ×3
ELECTRODE REM PT RTRN 9FT ADLT (ELECTROSURGICAL) ×2 IMPLANT
GAUZE 4X4 16PLY ~~LOC~~+RFID DBL (SPONGE) ×2 IMPLANT
GAUZE SPONGE 4X4 12PLY STRL (GAUZE/BANDAGES/DRESSINGS) ×2 IMPLANT
GLOVE BIO SURGEON STRL SZ7 (GLOVE) ×9 IMPLANT
GOWN STRL REUS W/ TWL LRG LVL3 (GOWN DISPOSABLE) ×4 IMPLANT
GOWN STRL REUS W/TWL LRG LVL3 (GOWN DISPOSABLE) ×18
MANIFOLD NEPTUNE II (INSTRUMENTS) ×3 IMPLANT
MESH VENTRALEX ST 2.5 CRC MED (Mesh General) ×1 IMPLANT
NDL HYPO 25X1 1.5 SAFETY (NEEDLE) ×2 IMPLANT
NEEDLE HYPO 22GX1.5 SAFETY (NEEDLE) ×3 IMPLANT
NEEDLE HYPO 25X1 1.5 SAFETY (NEEDLE) ×3 IMPLANT
PACK BASIN MINOR ARMC (MISCELLANEOUS) ×3 IMPLANT
PAD DRESSING TELFA 3X8 NADH (GAUZE/BANDAGES/DRESSINGS) ×2 IMPLANT
SPONGE T-LAP 18X18 ~~LOC~~+RFID (SPONGE) ×3 IMPLANT
STAPLER SKIN PROX 35W (STAPLE) ×2 IMPLANT
SUT ETHIBOND 0 MO6 C/R (SUTURE) ×4 IMPLANT
SUT VIC AB 2-0 SH 27 (SUTURE) ×6
SUT VIC AB 2-0 SH 27XBRD (SUTURE) ×4 IMPLANT
SYR 20ML LL LF (SYRINGE) ×3 IMPLANT
TAPE MICROFOAM 4IN (TAPE) ×2 IMPLANT
WATER STERILE IRR 1000ML POUR (IV SOLUTION) ×2 IMPLANT
WATER STERILE IRR 500ML POUR (IV SOLUTION) ×2 IMPLANT

## 2021-07-19 NOTE — Op Note (Signed)
INCISIONAL Hernia Repair with 6.4 Ventralex Mesh ? ?Pre-operative Diagnosis: incisional hernia 3 cm ? ?Post-operative Diagnosis: same ? ?Surgeon: Caroleen Hamman, MD FACS ? ?Anesthesia: Gen. with endotracheal tube ? ? ?Findings: ?3 cm incisional hernia ? ?Estimated Blood Loss: 5cc ?              ?Specimens: sac    ?      ?Complications: none ?        ?     ?Procedure Details  ?The patient was seen again in the Holding Room. The benefits, complications, treatment options, and expected outcomes were discussed with the patient. The risks of bleeding, infection, recurrence of symptoms, failure to resolve symptoms, bowel injury, mesh placement, mesh infection, any of which could require further surgery were reviewed with the patient. The likelihood of improving the patient's symptoms with return to their baseline status is good.  The patient and/or family concurred with the proposed plan, giving informed consent.  The patient was taken to Operating Room, identified as Elizabeth Crawford and the procedure verified.  A Time Out was held and the above information confirmed. ? ?Prior to the induction of general anesthesia, antibiotic prophylaxis was administered. VTE prophylaxis was in place. General endotracheal anesthesia was then administered and tolerated well. After the induction, the abdomen was prepped with Chloraprep and draped in the sterile fashion. The patient was positioned in the supine position. ? ?Incision was created with a scalpel over the hernia defect. Electrocautery was used to dissect through subcutaneous tissue, the hernia sac was opened and rises of adhesion was performed with Metzenbaum's scissors. Hernia sac was excised. ?The hernia was measured and the mesh was selected.  ?The mesh was placed in an underlay fashion. 4 trans-fascial sutures were placed and used to anchor the mesh using interrupted 0 Ethibond sutures, the mesh laid really nicely and flat. Liposomal marcaine injected around the abdominal  wall as a full thickness block ? ?I closed the hernia defect with interrupted 0 Ethibond sutures.  ? ?Incision was closed in a 2 layer fashion with 3-0 Vicryl and 4-0 Monocryl. Dermabond was used to coat the skin.  Patient tolerated procedure well and there were no immediate complications. Needle and laparotomy counts were correct ? ? Caroleen Hamman, MD, FACS ?  ?

## 2021-07-19 NOTE — Interval H&P Note (Signed)
History and Physical Interval Note: ? ?07/19/2021 ?7:24 AM ? ?Elizabeth Crawford  has presented today for surgery, with the diagnosis of ventral incisional hernia repair 3 cm.  The various methods of treatment have been discussed with the patient and family. After consideration of risks, benefits and other options for treatment, the patient has consented to  Procedure(s): ?HERNIA REPAIR VENTRAL ADULT, incisional repair (N/A) as a surgical intervention.  The patient's history has been reviewed, patient examined, no change in status, stable for surgery.  I have reviewed the patient's chart and labs.  Questions were answered to the patient's satisfaction.   ? ? ?[name] ? ? ?

## 2021-07-19 NOTE — Discharge Instructions (Addendum)
Open Hernia Repair, Adult, Care After What can I expect after the procedure? After the procedure, it is common to have: Mild discomfort. Slight bruising. Mild swelling. Pain in the belly (abdomen). A small amount of blood from the cut from surgery (incision). Follow these instructions at home: Your doctor may give you more specific instructions. If you have problems, call your doctor. Medicines Take over-the-counter and prescription medicines only as told by your doctor. If told, take steps to prevent problems with pooping (constipation). You may need to: Drink enough fluid to keep your pee (urine) pale yellow. Take medicines. You will be told what medicines to take. Eat foods that are high in fiber. These include beans, whole grains, and fresh fruits and vegetables. Limit foods that are high in fat and sugar. These include fried or sweet foods. Ask your doctor if you should avoid driving or using machines while you are taking your medicine. Incision care  Follow instructions from your doctor about how to take care of your incision. Make sure you: Wash your hands with soap and water for at least 20 seconds before and after you change your bandage (dressing). If you cannot use soap and water, use hand sanitizer. Change your bandage. Leave stitches or skin glue in place for at least 2 weeks. Leave tape strips alone unless you are told to take them off. You may trim the edges of the tape strips if they curl up. Check your incision every day for signs of infection. Check for: More redness, swelling, or pain. More fluid or blood. Warmth. Pus or a bad smell. Wear loose, soft clothing while your incision heals. Activity  Rest as told by your doctor. Do not lift anything that is heavier than 10 lb (4.5 kg), or the limit that you are told. Do not play contact sports until your doctor says that this is safe. If you were given a sedative during your procedure, do not drive or use machines  until your doctor says that it is safe. A sedative is a medicine that helps you relax. Return to your normal activities when your doctor says that it is safe. General instructions Do not take baths, swim, or use a hot tub. Ask your doctor about taking showers or sponge baths. Hold a pillow over your belly when you cough or sneeze. This helps with pain. Do not smoke or use any products that contain nicotine or tobacco. If you need help quitting, ask your doctor. Keep all follow-up visits. Contact a doctor if: You have any of these signs of infection in or around your incision: More redness, swelling, or pain. More fluid or blood. Warmth. Pus. A bad smell. You have a fever or chills. You have blood in your poop (stool). You have not pooped (had a bowel movement) in 2-3 days. Medicine does not help your pain. Get help right away if: You have chest pain, or you are short of breath. You feel faint or light-headed. You have very bad pain. You vomit and your pain is worse. You have pain, swelling, or redness in a leg. These symptoms may be an emergency. Get help right away. Call your local emergency services (911 in the [place] Do not wait to see if the symptoms will go away. Do not drive yourself to the hospital. Summary After this procedure, it is common to have mild discomfort, slight bruising, and mild swelling. Follow instructions from your doctor about how to take care of your cut from surgery (incision). Check every   day for signs of infection. Do not lift heavy objects or play contact sports until your doctor says it is safe. Return to your normal activities as told by your doctor. This information is not intended to replace advice given to you by your health care provider. Make sure you discuss any questions you have with your health care provider. Document Revised: 10/25/2019 Document Reviewed: 10/25/2019 Elsevier Patient Education  2023 [employer]  AMBULATORY SURGERY   DISCHARGE INSTRUCTIONS   The drugs that you were given will stay in your system until tomorrow so for the next 24 hours you should not:  Drive an automobile Make any legal decisions Drink any alcoholic beverage   You may resume regular meals tomorrow.  Today it is better to start with liquids and gradually work up to solid foods.  You may eat anything you prefer, but it is better to start with liquids, then soup and crackers, and gradually work up to solid foods.   Please notify your doctor immediately if you have any unusual bleeding, trouble breathing, redness and pain at the surgery site, drainage, fever, or pain not relieved by medication.    Additional Instructions:        Please contact your physician with any problems or Same Day Surgery at [phone], Monday through Friday 6 am to 4 pm, or [facility] Main number at [phone].    

## 2021-07-19 NOTE — Anesthesia Procedure Notes (Signed)
Procedure Name: Intubation ?Date/Time: 07/19/2021 7:35 AM ?Performed by: Beverely Low, CRNA ?Pre-anesthesia Checklist: Patient identified, Patient being monitored, Timeout performed, Emergency Drugs available and Suction available ?Patient Re-evaluated:Patient Re-evaluated prior to induction ?Oxygen Delivery Method: Circle system utilized ?Preoxygenation: Pre-oxygenation with 100% oxygen ?Induction Type: IV induction ?Ventilation: Mask ventilation without difficulty ?Laryngoscope Size: 3 and Glidescope ?Grade View: Grade I ?Tube type: Oral ?Tube size: 7.0 mm ?Number of attempts: 1 ?Airway Equipment and Method: Stylet ?Placement Confirmation: ETT inserted through vocal cords under direct vision, positive ETCO2 and breath sounds checked- equal and bilateral ?Secured at: 21 cm ?Tube secured with: Tape ?Dental Injury: Teeth and Oropharynx as per pre-operative assessment  ? ? ? ? ?

## 2021-07-19 NOTE — Transfer of Care (Signed)
Immediate Anesthesia Transfer of Care Note ? ?Patient: Elizabeth Crawford ? ?Procedure(s) Performed: HERNIA REPAIR VENTRAL ADULT, incisional repair ?INSERTION OF MESH ? ?Patient Location: PACU ? ?Anesthesia Type:General ? ?Level of Consciousness: drowsy ? ?Airway & Oxygen Therapy: Patient Spontanous Breathing and Patient connected to face mask oxygen ? ?Post-op Assessment: Report given to RN and Post -op Vital signs reviewed and stable ? ?Post vital signs: Reviewed and stable ? ?Last Vitals:  ?Vitals Value Taken Time  ?BP    ?Temp    ?Pulse    ?Resp    ?SpO2    ? ? ?Last Pain:  ?Vitals:  ? 07/19/21 0654  ?TempSrc: Oral  ?PainSc: 0-No pain  ?   ? ?  ? ?Complications: No notable events documented. ?

## 2021-07-19 NOTE — Anesthesia Preprocedure Evaluation (Signed)
Anesthesia Evaluation  ?Patient identified by MRN, date of birth, ID band ?Patient awake ? ? ? ?Reviewed: ?Allergy & Precautions, H&P , NPO status , Patient's Chart, lab work & pertinent test results, reviewed documented beta blocker date and time  ? ?History of Anesthesia Complications ?(+) PONV and history of anesthetic complications ? ?Airway ?Mallampati: II ? ?TM Distance: >3 FB ?Neck ROM: full ? ? ? Dental ? ?(+) Teeth Intact ?  ?Pulmonary ?sleep apnea and Continuous Positive Airway Pressure Ventilation , former smoker,  ?  ?Pulmonary exam normal ? ? ? ? ? ? ? Cardiovascular ?negative cardio ROS ?Normal cardiovascular exam ?Rhythm:regular Rate:Normal ? ? ?  ?Neuro/Psych ?negative neurological ROS ? negative psych ROS  ? GI/Hepatic ?Neg liver ROS, hiatal hernia, GERD  Medicated,  ?Endo/Other  ?negative endocrine ROS ? Renal/GU ?negative Renal ROS  ?negative genitourinary ?  ?Musculoskeletal ? ? Abdominal ?  ?Peds ? Hematology ?negative hematology ROS ?(+)   ?Anesthesia Other Findings ?Past Medical History: ?No date: Allergy ?No date: Arthritis ?No date: Complication of anesthesia ?2011: DVT (deep venous thrombosis) ([place]) ?    Comment:  has IVC filter ?No date: GERD (gastroesophageal reflux disease) ?No date: H/O seasonal allergies ?No date: History of hiatal hernia ?No date: Hypercholesteremia ?No date: PONV (postoperative nausea and vomiting) ?    Comment:  Gets sick every time ?No date: Pulmonary embolism ([place]) ?No date: Sleep apnea ?No date: Spondylolisthesis ?Past Surgical History: ?No date: BACK SURGERY ?    Comment:  x2 ?2009: BLADDER SUSPENSION ?03/01/2019: BREAST REDUCTION SURGERY; Bilateral ?    Comment:  Procedure: BILATERAL BREAST REDUCTION;  Surgeon:  ?             Wallace Going, DO;  Location: [facility];  Service:  ?             Plastics;  Laterality: Bilateral;  total case 4 hours ?2003: CARPAL TUNNEL RELEASE ?    Comment:  Left hand ?2000: CHOLECYSTECTOMY ?     Comment:  Laparoscopic ?2014: COLONOSCOPY W/ POLYPECTOMY ?05/22/2017: COLONOSCOPY WITH PROPOFOL; N/A ?    Comment:  Procedure: COLONOSCOPY WITH PROPOFOL;  Surgeon:  ?             Lollie Sails, MD;  Location: [facility];   ?             Service: Endoscopy;  Laterality: N/A; ?11/09/2015: DECOMPRESSIVE LUMBAR LAMINECTOMY LEVEL 2; N/A ?    Comment:  Procedure: DECOMPRESSION L2-5 REMOVAL PEDICULE SCREWS  ?             L4-5;  Surgeon: Melina Schools, MD;  Location: [facility];   ?             Service: Orthopedics;  Laterality: N/A; ?10/09/2018: ESOPHAGOGASTRODUODENOSCOPY (EGD) WITH PROPOFOL; N/A ?    Comment:  Procedure: ESOPHAGOGASTRODUODENOSCOPY (EGD) WITH  ?             PROPOFOL;  Surgeon: Lollie Sails, MD;  Location:  ?             [facility];  Service: Endoscopy;  Laterality: N/A; ?05/16/2020: ESOPHAGOGASTRODUODENOSCOPY (EGD) WITH PROPOFOL; N/A ?    Comment:  Procedure: ESOPHAGOGASTRODUODENOSCOPY (EGD) WITH  ?             PROPOFOL;  Surgeon: Jonathon Bellows, MD;  Location: [facility]  ?             ENDOSCOPY;  Service: Gastroenterology;  Laterality: N/A; ?2005: EYE SURGERY; Bilateral ?  Comment:  cataract surgery  ?11/09/2015: HARDWARE REMOVAL; N/A ?    Comment:  Procedure: HARDWARE REMOVAL;  Surgeon: Melina Schools,  ?             MD;  Location: [facility];  Service: Orthopedics;  Laterality: ?             N/A; ?1978: HEMORRHOID SURGERY ?02-01-10: IVC filter placement ?03/01/2019: REDUCTION MAMMAPLASTY; Bilateral ?5 23-12: SPINE SURGERY ?    Comment:  anterolateral fusion/ rodding ?No date: TONSILLECTOMY ?1977: TUBAL LIGATION ?05/23/2020: XI ROBOTIC ASSISTED HIATAL HERNIA REPAIR; N/A ?    Comment:  Procedure: XI ROBOTIC ASSISTED HIATAL HERNIA REPAIR with ?             Armida Sans, RNFA to assist;  Surgeon: Caroleen Hamman  ?             F, MD;  Location: [facility];  Service: General;   ?             Laterality: N/A;  Provider requesting 3 hours /180  ?             minutes for procedure ?BMI   ? Body Mass Index: 27.81 kg/m?   ?  ? Reproductive/Obstetrics ?negative OB ROS ? ?  ? ? ? ? ? ? ? ? ? ? ? ? ? ?  ?  ? ? ? ? ? ? ? ? ?Anesthesia Physical ?Anesthesia Plan ? ?ASA: 3 ? ?Anesthesia Plan: General ETT  ? ?Post-op Pain Management:   ? ?Induction:  ? ?PONV Risk Score and Plan:  ? ?Airway Management Planned:  ? ?Additional Equipment:  ? ?Intra-op Plan:  ? ?Post-operative Plan:  ? ?Informed Consent: I have reviewed the patients History and Physical, chart, labs and discussed the procedure including the risks, benefits and alternatives for the proposed anesthesia with the patient or authorized representative who has indicated his/her understanding and acceptance.  ? ? ? ?Dental Advisory Given ? ?Plan Discussed with: CRNA ? ?Anesthesia Plan Comments:   ? ? ? ? ? ? ?Anesthesia Quick Evaluation ? ?

## 2021-07-19 NOTE — Anesthesia Postprocedure Evaluation (Signed)
Anesthesia Post Note ? ?Patient: Elizabeth Crawford ? ?Procedure(s) Performed: HERNIA REPAIR VENTRAL ADULT, incisional repair ?INSERTION OF MESH ? ?Patient location during evaluation: PACU ?Anesthesia Type: General ?Level of consciousness: awake and alert ?Pain management: pain level controlled ?Vital Signs Assessment: post-procedure vital signs reviewed and stable ?Respiratory status: spontaneous breathing, nonlabored ventilation, respiratory function stable and patient connected to nasal cannula oxygen ?Cardiovascular status: blood pressure returned to baseline and stable ?Postop Assessment: no apparent nausea or vomiting ?Anesthetic complications: no ? ? ?No notable events documented. ? ? ?Last Vitals:  ?Vitals:  ? 07/19/21 1053 07/19/21 1056  ?BP: 106/63 110/62  ?Pulse: 67   ?Resp: 18   ?Temp:    ?SpO2: 96%   ?  ?Last Pain:  ?Vitals:  ? 07/19/21 1053  ?TempSrc:   ?PainSc: 0-No pain  ? ? ?  ?  ?  ?  ?  ?  ? ?[name] ? ? ? ? ?

## 2021-07-20 ENCOUNTER — Telehealth: Payer: Self-pay

## 2021-07-20 LAB — SURGICAL PATHOLOGY

## 2021-07-20 NOTE — Telephone Encounter (Signed)
Pt called and stated that she had a hernia repair done on 07/19/2021 and that her face was red. She denies fever, nausea, vomiting, rash, swelling, and she has not checked her bp. I advised pt if she experienced any of the above symptoms listed above to seek medical attention at [facility]. Pt verbalizes understanding. ?

## 2021-08-01 ENCOUNTER — Ambulatory Visit (INDEPENDENT_AMBULATORY_CARE_PROVIDER_SITE_OTHER): Payer: Medicare HMO | Admitting: Surgery

## 2021-08-01 ENCOUNTER — Encounter: Payer: Self-pay | Admitting: Surgery

## 2021-08-01 VITALS — BP 112/75 | HR 83 | Temp 97.9°F | Wt 170.6 lb

## 2021-08-01 DIAGNOSIS — Z09 Encounter for follow-up examination after completed treatment for conditions other than malignant neoplasm: Secondary | ICD-10-CM | POA: Diagnosis not present

## 2021-08-01 DIAGNOSIS — K432 Incisional hernia without obstruction or gangrene: Secondary | ICD-10-CM

## 2021-08-01 NOTE — Progress Notes (Signed)
Elizabeth Crawford is 2 weeks out after open incisional hernia repair.  She is doing well.  Some soreness.  No fevers no chills.  Taking p.o. well. ? ?PE NAD ?Abdomen: Soft nontender incision healing well without infection. ?No evidence of peritonitis and no evidence of recurrence ? ?A/p doing very well after hernia repair.  No evidence of surgical complications ?Return to clinic in 6 months or so per her wishes ?

## 2021-08-01 NOTE — Patient Instructions (Signed)
If you have any concerns or questions, please feel free to call our office.  ? ? ?GENERAL POST-OPERATIVE ?PATIENT INSTRUCTIONS  ? ?WOUND CARE INSTRUCTIONS:  Keep a dry clean dressing on the wound if there is drainage. The initial bandage may be removed after 24 hours.  Once the wound has quit draining you may leave it open to air.  If clothing rubs against the wound or causes irritation and the wound is not draining you may cover it with a dry dressing during the daytime.  Try to keep the wound dry and avoid ointments on the wound unless directed to do so.  If the wound becomes bright red and painful or starts to drain infected material that is not clear, please contact your physician immediately.  If the wound is mildly pink and has a thick firm ridge underneath it, this is normal, and is referred to as a healing ridge.  This will resolve over the next 4-6 weeks. ? ?BATHING: ?You may shower if you have been informed of this by your surgeon. However, Please do not submerge in a tub, hot tub, or pool until incisions are completely sealed or have been told by your surgeon that you may do so. ? ?DIET:  You may eat any foods that you can tolerate.  It is a good idea to eat a high fiber diet and take in plenty of fluids to prevent constipation.  If you do become constipated you may want to take a mild laxative or take ducolax tablets on a daily basis until your bowel habits are regular.  Constipation can be very uncomfortable, along with straining, after recent surgery. ? ?ACTIVITY:  You are encouraged to cough and deep breath or use your incentive spirometer if you were given one, every 15-30 minutes when awake.  This will help prevent respiratory complications and low grade fevers post-operatively if you had a general anesthetic.  You may want to hug a pillow when coughing and sneezing to add additional support to the surgical area, if you had abdominal or chest surgery, which will decrease pain during these times.   You are encouraged to walk and engage in light activity for the next two weeks.  You should not lift, push or pull more than 20 pounds for 6 weeks total after surgery as it could put you at increased risk for complications.  Twenty pounds is roughly equivalent to a plastic bag of groceries. At that time- Listen to your body when lifting, if you have pain when lifting, stop and then try again in a few days. Soreness after doing exercises or activities of daily living is normal as you get back in to your normal routine. ? ?MEDICATIONS:  Try to take narcotic medications and anti-inflammatory medications, such as tylenol, ibuprofen, naprosyn, etc., with food.  This will minimize stomach upset from the medication.  Should you develop nausea and vomiting from the pain medication, or develop a rash, please discontinue the medication and contact your physician.  You should not drive, make important decisions, or operate machinery when taking narcotic pain medication. ? ?SUNBLOCK ?Use sun block to incision area over the next year if this area will be exposed to sun. This helps decrease scarring and will allow you avoid a permanent darkened area over your incision. ? ?QUESTIONS:  Please feel free to call our office if you have any questions, and we will be glad to assist you. [phone] ? ? ?

## 2021-09-09 NOTE — Progress Notes (Unsigned)
MRN : [id number]  Elizabeth Crawford is a 76 y.o. (Aug 18, 1945) female who presents with chief complaint of legs hurt and swell.  History of Present Illness:   The patient returns to the office for follow up regarding her past DVT.  DVT was identified at [facility] by Duplex ultrasound remotely.  The initial symptoms were pain and swelling in the lower extremity.   At that time she had a filter placed at that time. Several months later attempts at removing the filter were unsuccessful. Since then we have been monitoring the patency of the inferior vena cava.   The patient notes her legs have some pain with dependency and swell.  Symptoms are much better with elevation.  The patient notes minimal edema in the morning which steadily increases throughout the day.     The patient has not been using compression on a daily at this point.   The patient is status post Nissen fundoplication as well as breast reduction secondary to severe respiratory symptoms.  She did not have any problems with either surgery.  She notes that her symptoms are dramatically improved having completed these 2 procedures.   No SOB or pleuritic chest pains.  No cough or hemoptysis.   No blood per rectum or blood in any sputum.  No excessive bruising per the patient.    Duplex ultrasound of the inferior vena cava shows the filter is patent and normal flow within the cava  No outpatient medications have been marked as taking for the 09/10/21 encounter (Appointment) with Delana Meyer, Dolores Lory, MD.    Past Medical History:  Diagnosis Date   Allergy    Arthritis    Complication of anesthesia    DVT (deep venous thrombosis) ([place]) 2011   has IVC filter   GERD (gastroesophageal reflux disease)    H/O seasonal allergies    History of hiatal hernia    Hypercholesteremia    PONV (postoperative nausea and vomiting)    Gets sick every time   Pulmonary embolism ([place])    Sleep apnea    Spondylolisthesis     Past Surgical  History:  Procedure Laterality Date   BACK SURGERY     x2   BLADDER SUSPENSION  2009   BREAST REDUCTION SURGERY Bilateral 03/01/2019   Procedure: BILATERAL BREAST REDUCTION;  Surgeon: Wallace Going, DO;  Location: [place];  Service: Plastics;  Laterality: Bilateral;  total case 4 hours   CARPAL TUNNEL RELEASE  2003   Left hand   CHOLECYSTECTOMY  2000   Laparoscopic   COLONOSCOPY W/ POLYPECTOMY  2014   COLONOSCOPY WITH PROPOFOL N/A 05/22/2017   Procedure: COLONOSCOPY WITH PROPOFOL;  Surgeon: Lollie Sails, MD;  Location: [facility] ENDOSCOPY;  Service: Endoscopy;  Laterality: N/A;   DECOMPRESSIVE LUMBAR LAMINECTOMY LEVEL 2 N/A 11/09/2015   Procedure: DECOMPRESSION L2-5 REMOVAL PEDICULE SCREWS L4-5;  Surgeon: Melina Schools, MD;  Location: [place];  Service: Orthopedics;  Laterality: N/A;   ESOPHAGOGASTRODUODENOSCOPY (EGD) WITH PROPOFOL N/A 10/09/2018   Procedure: ESOPHAGOGASTRODUODENOSCOPY (EGD) WITH PROPOFOL;  Surgeon: Lollie Sails, MD;  Location: [facility] ENDOSCOPY;  Service: Endoscopy;  Laterality: N/A;   ESOPHAGOGASTRODUODENOSCOPY (EGD) WITH PROPOFOL N/A 05/16/2020   Procedure: ESOPHAGOGASTRODUODENOSCOPY (EGD) WITH PROPOFOL;  Surgeon: Jonathon Bellows, MD;  Location: [facility] ENDOSCOPY;  Service: Gastroenterology;  Laterality: N/A;   EYE SURGERY Bilateral 2005   cataract surgery    HARDWARE REMOVAL N/A 11/09/2015   Procedure: HARDWARE REMOVAL;  Surgeon: Melina Schools, MD;  Location: [facility]  OR;  Service: Orthopedics;  Laterality: N/A;   HEMORRHOID SURGERY  1978   INSERTION OF MESH  07/19/2021   Procedure: INSERTION OF MESH;  Surgeon: Jules Husbands, MD;  Location: [facility] ORS;  Service: General;;   IVC filter placement  02-01-10   REDUCTION MAMMAPLASTY Bilateral 03/01/2019   SPINE SURGERY  5 23-12   anterolateral fusion/ rodding   TONSILLECTOMY     TUBAL LIGATION  1977   VENTRAL HERNIA REPAIR N/A 07/19/2021   Procedure: HERNIA REPAIR VENTRAL ADULT, incisional repair;  Surgeon: Jules Husbands, MD;   Location: [facility] ORS;  Service: General;  Laterality: N/A;   XI ROBOTIC ASSISTED HIATAL HERNIA REPAIR N/A 05/23/2020   Procedure: XI ROBOTIC ASSISTED HIATAL HERNIA REPAIR with Adrianne Allred, RNFA to assist;  Surgeon: Jules Husbands, MD;  Location: [facility] ORS;  Service: General;  Laterality: N/A;  Provider requesting 3 hours /180 minutes for procedure    Social History Social History   Tobacco Use   Smoking status: Former    Years: 15.00    Types: Cigarettes    Quit date: 02/26/1976    Years since quitting: 45.5    Passive exposure: Past   Smokeless tobacco: Never  Vaping Use   Vaping Use: Never used  Substance Use Topics   Alcohol use: Yes    Comment: rarely   Drug use: No    Family History Family History  Problem Relation Age of Onset   COPD Mother    Heart disease Mother    Aortic aneurysm Mother    Heart disease Father     Allergies  Allergen Reactions   Tape     Paper and adhesive tape make blisters and redness and difficult to remove even on the first day   Elemental Sulfur Nausea And Vomiting   Methylprednisolone Palpitations   Sulfasalazine Nausea Only     REVIEW OF SYSTEMS (Negative unless checked)  Constitutional: '[]'$ Weight loss  '[]'$ Fever  '[]'$ Chills Cardiac: '[]'$ Chest pain   '[]'$ Chest pressure   '[]'$ Palpitations   '[]'$ Shortness of breath when laying flat   '[]'$ Shortness of breath with exertion. Vascular:  '[]'$ Pain in legs with walking   '[x]'$ Pain in legs at rest  '[]'$ History of DVT   '[]'$ Phlebitis   '[x]'$ Swelling in legs   '[]'$ Varicose veins   '[]'$ Non-healing ulcers Pulmonary:   '[]'$ Uses home oxygen   '[]'$ Productive cough   '[]'$ Hemoptysis   '[]'$ Wheeze  '[]'$ COPD   '[]'$ Asthma Neurologic:  '[]'$ Dizziness   '[]'$ Seizures   '[]'$ History of stroke   '[]'$ History of TIA  '[]'$ Aphasia   '[]'$ Vissual changes   '[]'$ Weakness or numbness in arm   '[]'$ Weakness or numbness in leg Musculoskeletal:   '[]'$ Joint swelling   '[]'$ Joint pain   '[]'$ Low back pain Hematologic:  '[]'$ Easy bruising  '[]'$ Easy bleeding   '[]'$ Hypercoagulable state    '[]'$ Anemic Gastrointestinal:  '[]'$ Diarrhea   '[]'$ Vomiting  '[x]'$ Gastroesophageal reflux/heartburn   '[]'$ Difficulty swallowing. Genitourinary:  '[]'$ Chronic kidney disease   '[]'$ Difficult urination  '[]'$ Frequent urination   '[]'$ Blood in urine Skin:  '[]'$ Rashes   '[]'$ Ulcers  Psychological:  '[]'$ History of anxiety   '[]'$  History of major depression.  Physical Examination  There were no vitals filed for this visit. There is no height or weight on file to calculate BMI. Gen: WD/WN, NAD Head: [place]/AT, No temporalis wasting.  Ear/Nose/Throat: Hearing grossly intact, nares w/o erythema or drainage, pinna without lesions Eyes: PER, EOMI, sclera nonicteric.  Neck: Supple, no gross masses.  No JVD.  Pulmonary:  Good air movement, no audible wheezing, no use  of accessory muscles.  Cardiac: RRR, precordium not hyperdynamic. Vascular:  scattered varicosities present bilaterally.  Moderate venous stasis changes to the legs bilaterally.  2+ soft pitting edema  Vessel Right Left  Radial Palpable Palpable  Gastrointestinal: soft, non-distended. No guarding/no peritoneal signs.  Musculoskeletal: M/S 5/5 throughout.  No deformity.  Neurologic: CN 2-12 intact. Pain and light touch intact in extremities.  Symmetrical.  Speech is fluent. Motor exam as listed above. Psychiatric: Judgment intact, Mood & affect appropriate for pt's clinical situation. Dermatologic: Venous rashes no ulcers noted.  No changes consistent with cellulitis. Lymph : No lichenification or skin changes of chronic lymphedema.  CBC Lab Results  Component Value Date   WBC 5.9 07/12/2021   HGB 12.1 07/12/2021   HCT 37.6 07/12/2021   MCV 94.9 07/12/2021   PLT 129 (L) 07/12/2021    BMET    Component Value Date/Time   NA 138 07/12/2021 1150   K 3.6 07/12/2021 1150   CL 106 07/12/2021 1150   CO2 26 07/12/2021 1150   GLUCOSE 78 07/12/2021 1150   BUN 13 07/12/2021 1150   CREATININE 0.82 07/12/2021 1150   CALCIUM 9.2 07/12/2021 1150   GFRNONAA >60 07/12/2021  1150   GFRAA >60 01/28/2019 1030   CrCl cannot be calculated (Patient's most recent lab result is older than the maximum 21 days allowed.).  COAG Lab Results  Component Value Date   INR 1.17 08/18/2010   INR 1.09 08/17/2010   INR 1.07 08/15/2010    Radiology No results found.   Assessment/Plan There are no diagnoses linked to this encounter.   Hortencia Pilar, MD  09/09/2021 3:59 PM

## 2021-09-10 ENCOUNTER — Ambulatory Visit (INDEPENDENT_AMBULATORY_CARE_PROVIDER_SITE_OTHER): Payer: Medicare HMO

## 2021-09-10 ENCOUNTER — Encounter (INDEPENDENT_AMBULATORY_CARE_PROVIDER_SITE_OTHER): Payer: Self-pay | Admitting: Vascular Surgery

## 2021-09-10 ENCOUNTER — Ambulatory Visit (INDEPENDENT_AMBULATORY_CARE_PROVIDER_SITE_OTHER): Payer: Medicare HMO | Admitting: Vascular Surgery

## 2021-09-10 VITALS — BP 120/76 | HR 67 | Ht 62.0 in | Wt 172.0 lb

## 2021-09-10 DIAGNOSIS — Z86718 Personal history of other venous thrombosis and embolism: Secondary | ICD-10-CM | POA: Diagnosis not present

## 2021-09-10 DIAGNOSIS — E7849 Other hyperlipidemia: Secondary | ICD-10-CM | POA: Diagnosis not present

## 2021-09-10 DIAGNOSIS — K219 Gastro-esophageal reflux disease without esophagitis: Secondary | ICD-10-CM

## 2021-09-10 DIAGNOSIS — I872 Venous insufficiency (chronic) (peripheral): Secondary | ICD-10-CM | POA: Diagnosis not present

## 2021-09-10 DIAGNOSIS — I8310 Varicose veins of unspecified lower extremity with inflammation: Secondary | ICD-10-CM

## 2021-10-01 ENCOUNTER — Telehealth (INDEPENDENT_AMBULATORY_CARE_PROVIDER_SITE_OTHER): Payer: Self-pay | Admitting: Vascular Surgery

## 2021-10-01 NOTE — Telephone Encounter (Signed)
LVM for pt TCB and schedule appt  SALINE sclero (unspecified laterality in chart). see fb. no auth req.

## 2021-11-12 ENCOUNTER — Other Ambulatory Visit: Payer: Self-pay | Admitting: Gastroenterology

## 2021-11-21 ENCOUNTER — Ambulatory Visit: Payer: Medicare HMO | Admitting: Surgery

## 2021-11-21 ENCOUNTER — Encounter: Payer: Self-pay | Admitting: Surgery

## 2021-11-21 VITALS — BP 123/79 | HR 69 | Temp 98.0°F | Ht 62.0 in | Wt 172.0 lb

## 2021-11-21 DIAGNOSIS — R109 Unspecified abdominal pain: Secondary | ICD-10-CM | POA: Diagnosis not present

## 2021-11-21 DIAGNOSIS — R110 Nausea: Secondary | ICD-10-CM

## 2021-11-21 NOTE — Patient Instructions (Addendum)
You may try using Gas-X to help with the abdominal bloating.   Follow-up with our office as needed.  Please call and ask to speak with a nurse if you develop questions or concerns.   Core Strength Exercises Ask your health care provider which exercises are safe for you. Do exercises exactly as told by your health care provider and adjust them as directed. It is normal to feel mild stretching, pulling, tightness, or discomfort as you do these exercises. Stop right away if you feel sudden pain or your pain gets worse. Do not begin these exercises until told by your health care provider. Benefits of core strength exercises Core exercises help to build strength in the muscles between your ribs and your hips (abdominal muscles). These muscles help to support your body and keep your spine stable. It is important to maintain strength in your core to prevent injury and pain. Some activities, such as yoga and Pilates, can help to strengthen core muscles. You can also strengthen core muscles with exercises at home. It is important to talk to your health care provider before you start a new exercise routine. Core strength exercises can: Reduce back pain. Help to rebuild strength after a back or spine injury. Help to prevent injury during physical activity, especially injuries to the back, hips, and knees. How to do core strength exercises Repeat these exercises 10-15 times, or until you are tired. Stop if you feel any pain while doing these exercises. Contact your health care provider if your pain continues or gets worse while doing or after doing core strength exercises. For strength exercises that are done on the floor, use a padded yoga mat or an exercise mat. Bridging  Lie on your back on a firm surface with your knees bent and your feet flat on the floor. Raise your hips so that your knees, hips, and shoulders together form a straight line. Do not excessively arch your back. Keep your abdominal  muscles tight. Hold this position for 3-5 seconds. Slowly lower your hips to the starting position. Let your muscles relax completely between repetitions. Single-leg bridge  Lie on your back on a firm surface with your knees bent and your feet flat on the floor. Raise your hips so that your knees, hips, and shoulders together form a straight line. Do not excessively arch your back. Keep your abdominal muscles tight. Lift one foot off the floor while maintaining alignment in your knees, hips, and shoulders. Then, completely straighten the lifted leg. Hold this position for 3-5 seconds. Put the straight leg back down in the bent position. Slowly lower your hips to the starting position. Repeat these steps using your other leg. Side bridge  Lie on your side with your knees bent. Prop yourself up on the elbow that is near the floor. Using your abdominal muscles and the elbow you are propped up on, raise your body off the floor. Raise your hip so that your shoulder, hip, and foot together form a straight line. Hold this position for 10 seconds. Keep your head and neck raised and away from your shoulder (in their normal, neutral position). Keep your abdominal muscles tight. Slowly lower your hip to the starting position. Repeat and try to hold this position longer, working your way up to 30 seconds. Abdominal crunch  Lie on your back on a firm surface. Bend your knees and keep your feet flat on the floor. Cross your arms over your chest. Without bending your neck, tip your chin  slightly toward your chest. Tighten your abdominal muscles as you lift your chest just high enough to lift your shoulder blades off the floor. Do not hold your breath. You can do this with short lifts or long lifts. Slowly return to the starting position. Bird dog  Get on your hands and knees, with your legs shoulder-width apart and your arms under your shoulders. Keep your back straight. Tighten your abdominal  muscles. Raise one of your legs off the floor and straighten it. Try to keep it parallel to the floor. Slowly lower your leg to the starting position. Raise one of your arms off the floor and straighten it. Try to keep it parallel to the floor. Slowly lower your arm to the starting position. Repeat with the other arm and leg. If possible, try raising a leg and an arm at the same time, on opposite sides of the body. For example, raise your left hand and your right leg. Rosilyn Mings on your belly. Prop up your body onto your forearms and your feet, keeping your legs straight. Your body should make a straight line between your shoulders and feet. Hold this position for 10 seconds while keeping your abdominal muscles tight. Lower your body to the starting position. Repeat and try to hold this position longer, working your way up to 30 seconds. Cross-core strengthening  Stand with your feet shoulder-width apart. Hold a ball out in front of you. Keep your arms straight. Tighten your abdominal muscles and slowly rotate at your waist from side to side. Keep your feet flat. Once you are comfortable, try repeating this exercise with a heavier ball. Top core strengthening  Stand about 18 inches (46 cm) out from a wall, with your back to the wall. Keep your feet flat and shoulder-width apart. Tighten your abdominal muscles. Bend your hips and knees. Slowly reach between your legs to touch the wall behind you. Slowly stand back up. Raise your arms over your head and reach behind you. Return to the starting position. General tips Do not do any exercises that cause pain. If you have pain while exercising, talk to your health care provider. Always stretch before and after doing these exercises. This can help prevent injury. Maintain a healthy weight. Ask your health care provider what weight is healthy for you. Contact a health care provider if: You have back pain that gets worse or does not go  away. You feel pain while doing core strength exercises. Get help right away if: You have severe pain that does not get better with medicine. Summary Core exercises help to build strength in the muscles between your ribs and your waist. Core muscles help to support your body and keep your spine stable. Some activities, such as yoga and Pilates, can help to strengthen core muscles. Core strength exercises can help back pain and can prevent injury. Stop if you feel any pain while doing core strength exercises. This information is not intended to replace advice given to you by your health care provider. Make sure you discuss any questions you have with your health care provider. Document Revised: 09/05/2020 Document Reviewed: 12/07/2019 Elsevier Patient Education  [place].

## 2021-11-21 NOTE — Progress Notes (Signed)
Outpatient Surgical Follow Up  11/21/2021  Elizabeth Crawford is an 76 y.o. female.   Chief Complaint  Patient presents with   Follow-up    HPI: The patient is well-known to me with a prior history of paraesophageal hernia with recurrence regarding redo paraesophageal hernia.  More recently 4 months ago when had an repair an incisional hernia with mesh and she has done fine.  She comes in with some concerns about some intermittent abdominal discomfort and some nausea.  She reports that the nausea seems to be triggered by large portions.  The abdominal pain has no specific alleviating or aggravating factors.  Nausea seems to improve with Zofran sublingually.  No fevers no chills no evidence of dysphagia  Past Medical History:  Diagnosis Date   Allergy    Arthritis    Complication of anesthesia    DVT (deep venous thrombosis) ([place]) 2011   has IVC filter   GERD (gastroesophageal reflux disease)    H/O seasonal allergies    History of hiatal hernia    Hypercholesteremia    PONV (postoperative nausea and vomiting)    Gets sick every time   Pulmonary embolism ([place])    Sleep apnea    Spondylolisthesis     Past Surgical History:  Procedure Laterality Date   BACK SURGERY     x2   BLADDER SUSPENSION  2009   BREAST REDUCTION SURGERY Bilateral 03/01/2019   Procedure: BILATERAL BREAST REDUCTION;  Surgeon: Wallace Going, DO;  Location: [place];  Service: Plastics;  Laterality: Bilateral;  total case 4 hours   CARPAL TUNNEL RELEASE  2003   Left hand   CHOLECYSTECTOMY  2000   Laparoscopic   COLONOSCOPY W/ POLYPECTOMY  2014   COLONOSCOPY WITH PROPOFOL N/A 05/22/2017   Procedure: COLONOSCOPY WITH PROPOFOL;  Surgeon: Lollie Sails, MD;  Location: [facility] ENDOSCOPY;  Service: Endoscopy;  Laterality: N/A;   DECOMPRESSIVE LUMBAR LAMINECTOMY LEVEL 2 N/A 11/09/2015   Procedure: DECOMPRESSION L2-5 REMOVAL PEDICULE SCREWS L4-5;  Surgeon: Melina Schools, MD;  Location: [place];  Service:  Orthopedics;  Laterality: N/A;   ESOPHAGOGASTRODUODENOSCOPY (EGD) WITH PROPOFOL N/A 10/09/2018   Procedure: ESOPHAGOGASTRODUODENOSCOPY (EGD) WITH PROPOFOL;  Surgeon: Lollie Sails, MD;  Location: [facility] ENDOSCOPY;  Service: Endoscopy;  Laterality: N/A;   ESOPHAGOGASTRODUODENOSCOPY (EGD) WITH PROPOFOL N/A 05/16/2020   Procedure: ESOPHAGOGASTRODUODENOSCOPY (EGD) WITH PROPOFOL;  Surgeon: Jonathon Bellows, MD;  Location: [facility] ENDOSCOPY;  Service: Gastroenterology;  Laterality: N/A;   EYE SURGERY Bilateral 2005   cataract surgery    HARDWARE REMOVAL N/A 11/09/2015   Procedure: HARDWARE REMOVAL;  Surgeon: Melina Schools, MD;  Location: [place];  Service: Orthopedics;  Laterality: N/A;   HEMORRHOID SURGERY  1978   INSERTION OF MESH  07/19/2021   Procedure: INSERTION OF MESH;  Surgeon: Jules Husbands, MD;  Location: [facility] ORS;  Service: General;;   IVC filter placement  02-01-10   REDUCTION MAMMAPLASTY Bilateral 03/01/2019   SPINE SURGERY  5 23-12   anterolateral fusion/ rodding   TONSILLECTOMY     TUBAL LIGATION  1977   VENTRAL HERNIA REPAIR N/A 07/19/2021   Procedure: HERNIA REPAIR VENTRAL ADULT, incisional repair;  Surgeon: Jules Husbands, MD;  Location: [facility] ORS;  Service: General;  Laterality: N/A;   XI ROBOTIC ASSISTED HIATAL HERNIA REPAIR N/A 05/23/2020   Procedure: XI ROBOTIC ASSISTED HIATAL HERNIA REPAIR with Armida Sans, RNFA to assist;  Surgeon: Jules Husbands, MD;  Location: [facility] ORS;  Service: General;  Laterality: N/A;  Provider requesting 3 hours /180 minutes for procedure    Family History  Problem Relation Age of Onset   COPD Mother    Heart disease Mother    Aortic aneurysm Mother    Heart disease Father     Social History:  reports that she quit smoking about 45 years ago. Her smoking use included cigarettes. She has been exposed to tobacco smoke. She has never used smokeless tobacco. She reports current alcohol use. She reports that she does not use drugs.  Allergies:  Allergies   Allergen Reactions   Tape     Paper and adhesive tape make blisters and redness and difficult to remove even on the first day   Elemental Sulfur Nausea And Vomiting   Methylprednisolone Palpitations   Sulfasalazine Nausea Only    Medications reviewed.    ROS Full ROS performed and is otherwise negative other than what is stated in HPI   BP 123/79   Pulse 69   Temp 98 F (36.7 C)   Ht '5\' 2"'$  (1.575 m)   Wt 172 lb (78 kg)   SpO2 97%   BMI 31.46 kg/m   Physical Exam Vitals and nursing note reviewed. Exam conducted with a chaperone present.  Constitutional:      General: She is not in acute distress.    Appearance: Normal appearance. She is normal weight. She is not ill-appearing.  Eyes:     General:        Right eye: No discharge.        Left eye: No discharge.  Cardiovascular:     Rate and Rhythm: Normal rate and regular rhythm.     Heart sounds: No murmur heard. Pulmonary:     Effort: Pulmonary effort is normal. No respiratory distress.     Breath sounds: Normal breath sounds. No stridor. No wheezing or rhonchi.  Abdominal:     General: Abdomen is flat. There is no distension.     Palpations: Abdomen is soft. There is no mass.     Tenderness: There is no abdominal tenderness. There is no guarding or rebound.     Hernia: No hernia is present.     Comments: Incisions healed, no evidence of hernias. Non tender  Musculoskeletal:        General: No swelling or tenderness. Normal range of motion.  Skin:    General: Skin is warm and dry.     Capillary Refill: Capillary refill takes less than 2 seconds.  Neurological:     General: No focal deficit present.     Mental Status: She is alert.  Psychiatric:        Mood and Affect: Mood normal.        Behavior: Behavior normal.        Thought Content: Thought content normal.        Judgment: Judgment normal.     Assessment/Plan: Nausea and abdominal discomfort.  No evidence of any mechanical issues at this time no  evidence of complications related to her surgeries.  She may have a functional GI component.  Discussed with her about portion control.  No evidence of hernia recurrence.  Reassured her about benign symptoms.  Return to clinic as needed.  Please note that I spent 20 minutes in this encounter including personally reviewing imaging studies and coordinating her care and performing appropriate documentation  Caroleen Hamman, MD [place] [facility]

## 2022-01-19 IMAGING — US US EXTREM LOW VENOUS*R*
1 series · 14 of 24 positions shown · non-contrast
Comparison: None.

CLINICAL DATA: Right lower extremity pain for the past 6 days

EXAM:
RIGHT LOWER EXTREMITY VENOUS DOPPLER ULTRASOUND
TECHNIQUE: Gray-scale sonography with compression, as well as color and duplex
ultrasound, were performed to evaluate the deep venous system(s)
from the level of the common femoral vein through the popliteal and
proximal calf veins.

[Series 1: us extrem low venous*right* · 0.07mm/px · 14 of 39 slices shown]
[im 1/39]
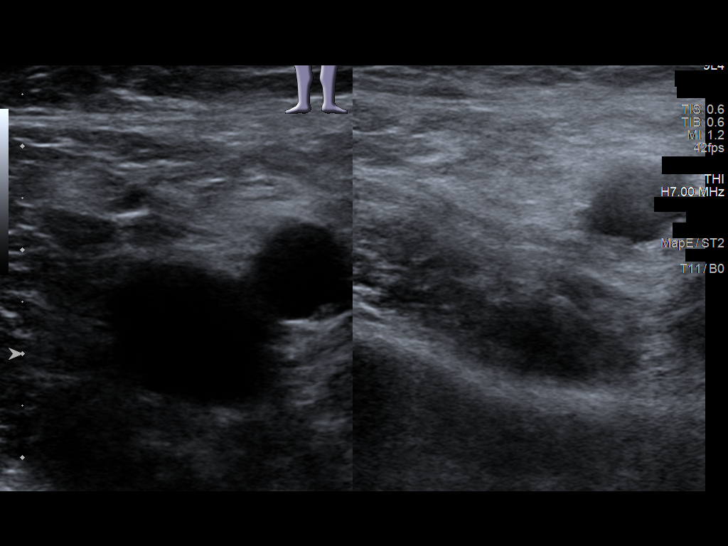
[im 4/39]
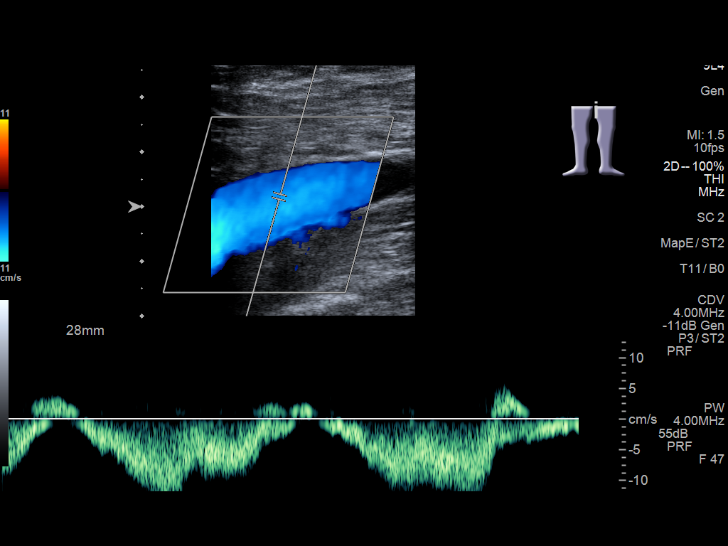
[im 7/39]
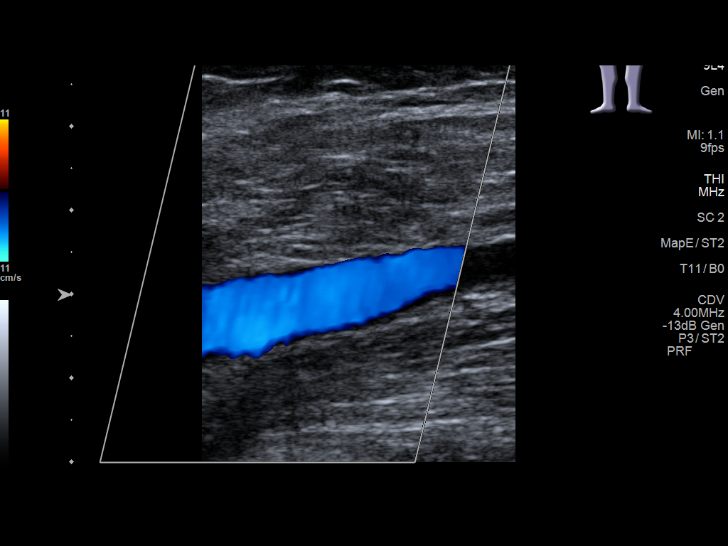
[im 10/39]
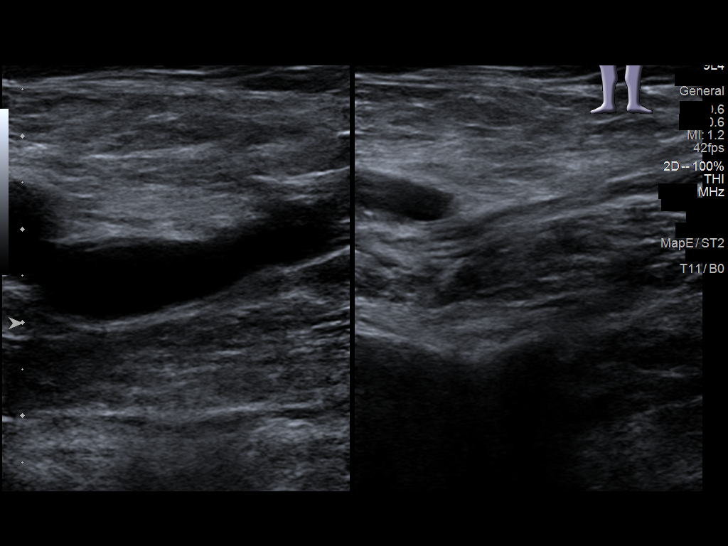
[im 12/39]
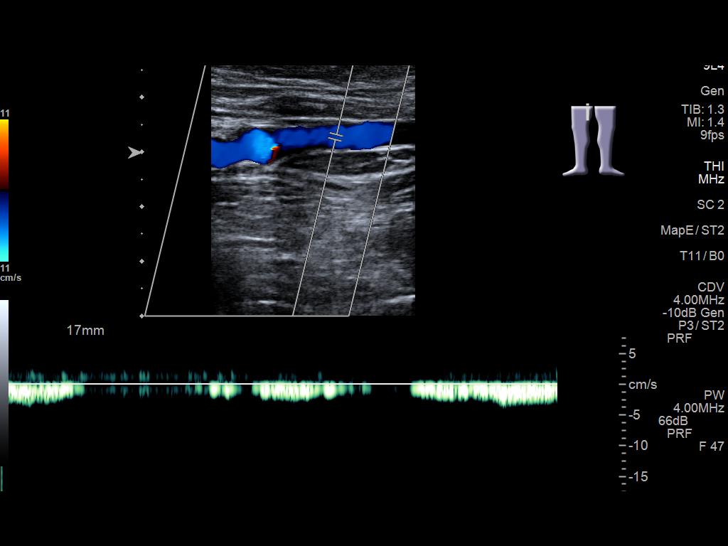
[im 15/39]
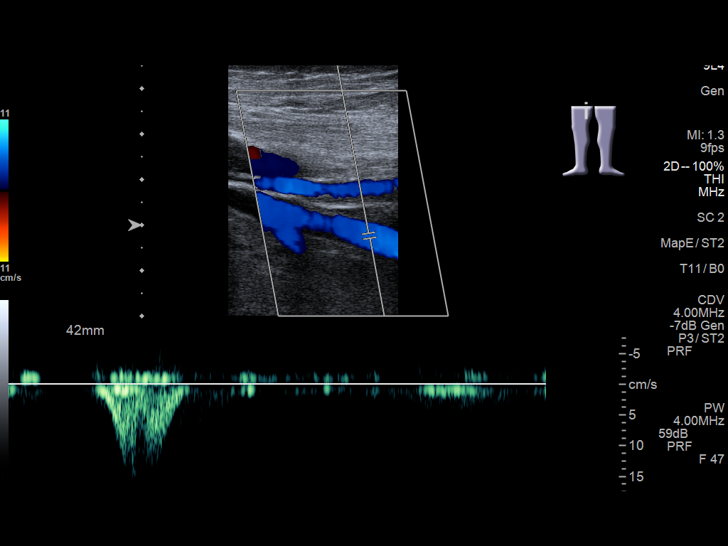
[im 19/39]
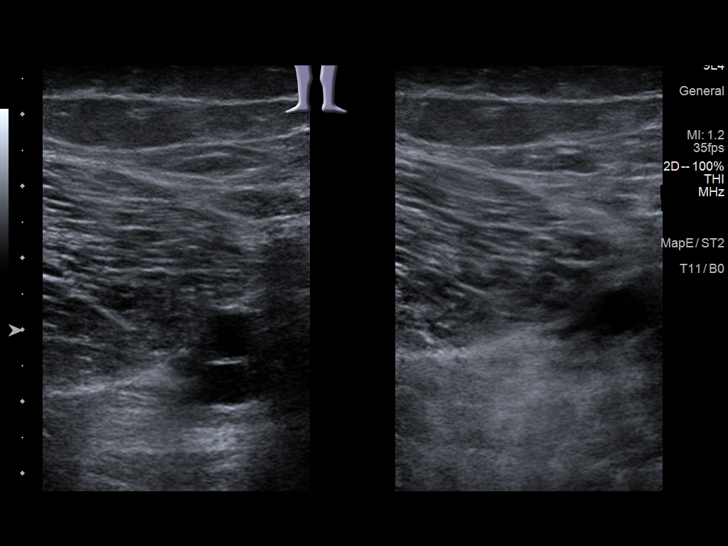
[im 20/39]
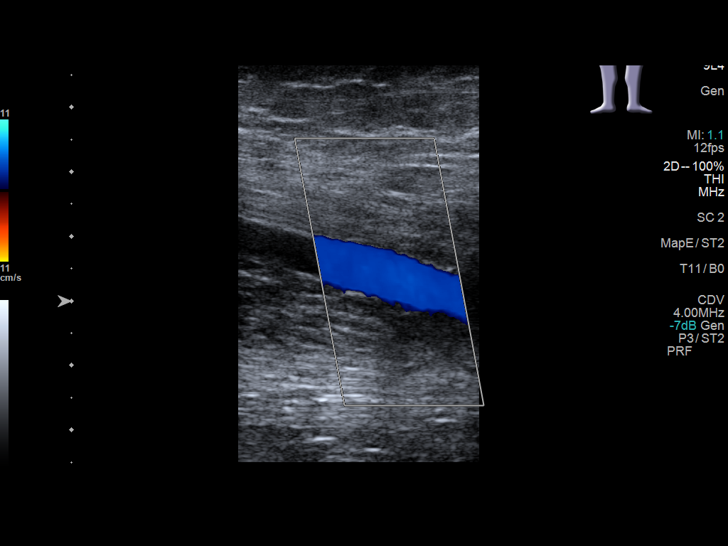
[im 24/39]
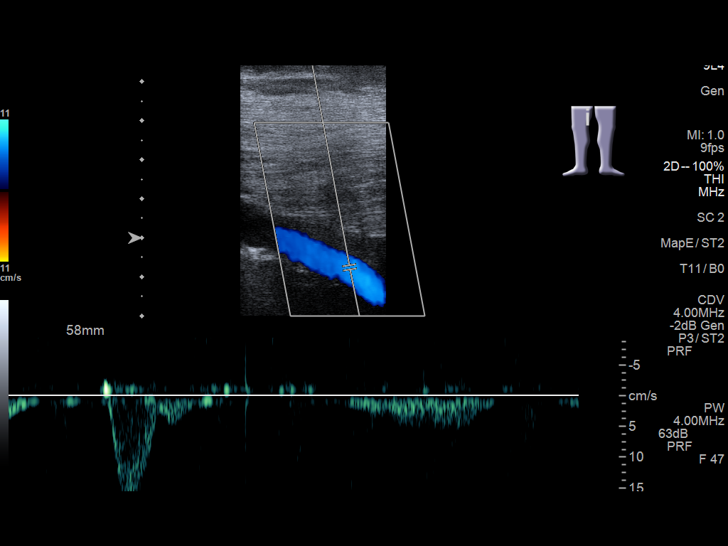
[im 27/39]
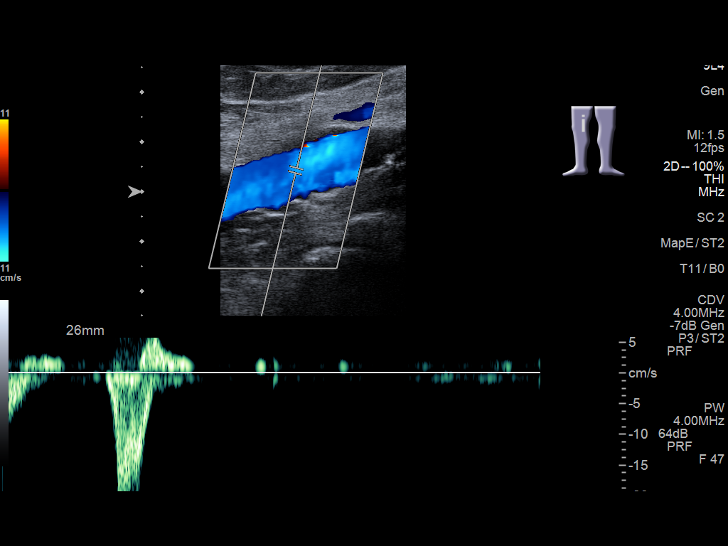
[im 30/39]
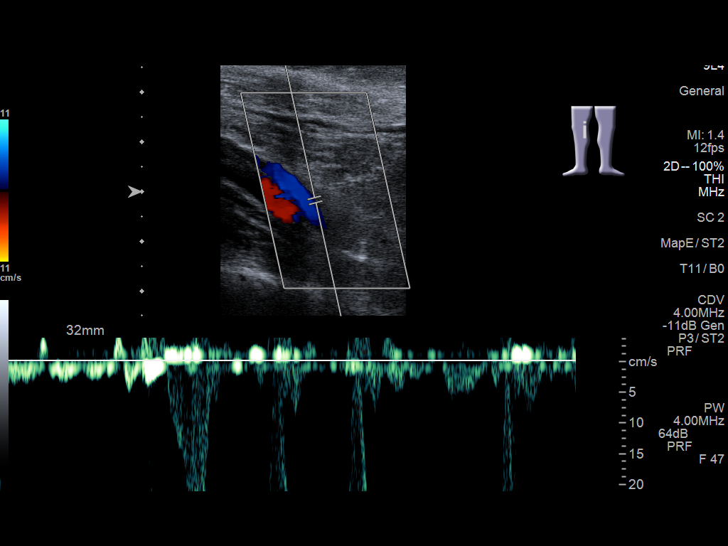
[im 32/39]
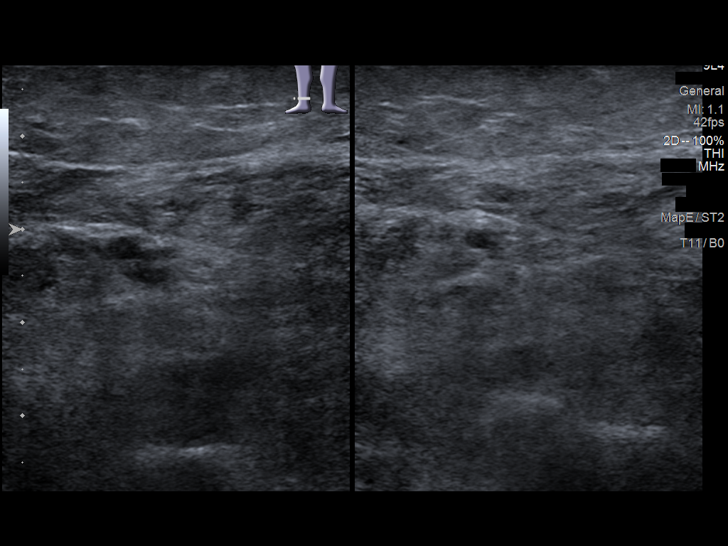
[im 35/39]
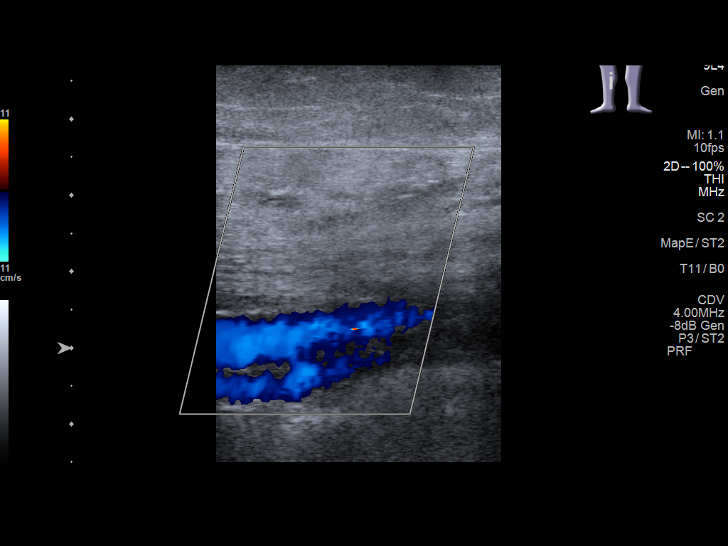
[im 39/39]
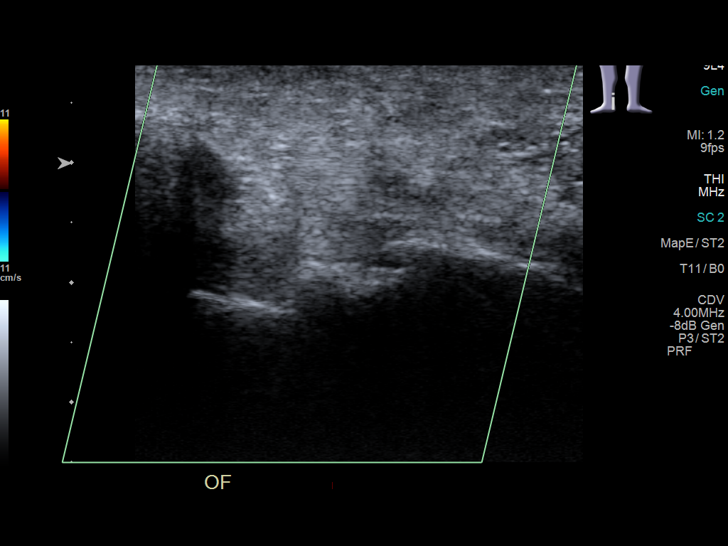

[14 of 24 positions shown; findings below may reference images not displayed]

FINDINGS: VENOUS

Normal compressibility of the common femoral, superficial femoral,
and popliteal veins, as well as the visualized calf veins.
Visualized portions of profunda femoral vein and great saphenous
vein unremarkable. No filling defects to suggest DVT on grayscale or
color Doppler imaging. Doppler waveforms show normal direction of
venous flow, normal respiratory plasticity and response to
augmentation.

Limited views of the contralateral common femoral vein are
unremarkable.

OTHER

None.

Limitations: none
IMPRESSION: Negative.

## 2022-04-17 ENCOUNTER — Other Ambulatory Visit: Payer: Self-pay

## 2022-04-17 ENCOUNTER — Encounter: Payer: Self-pay | Admitting: Surgery

## 2022-04-17 ENCOUNTER — Ambulatory Visit: Payer: Medicare HMO | Admitting: Surgery

## 2022-04-17 VITALS — BP 115/83 | HR 84 | Temp 98.2°F | Ht 64.0 in | Wt 172.0 lb

## 2022-04-17 DIAGNOSIS — D2230 Melanocytic nevi of unspecified part of face: Secondary | ICD-10-CM | POA: Insufficient documentation

## 2022-04-17 DIAGNOSIS — D225 Melanocytic nevi of trunk: Secondary | ICD-10-CM | POA: Insufficient documentation

## 2022-04-17 DIAGNOSIS — L821 Other seborrheic keratosis: Secondary | ICD-10-CM | POA: Insufficient documentation

## 2022-04-17 DIAGNOSIS — R1084 Generalized abdominal pain: Secondary | ICD-10-CM

## 2022-04-17 DIAGNOSIS — R112 Nausea with vomiting, unspecified: Secondary | ICD-10-CM

## 2022-04-17 DIAGNOSIS — G8929 Other chronic pain: Secondary | ICD-10-CM

## 2022-04-17 NOTE — Patient Instructions (Addendum)
Gastric Emptying study scheduled 04/26/22 @ 9 am @ [place]. Nothing to eat/drink 4 hours prior.   Referral sent to [facility]. Someone from their office will call you to schedule an appointment.   Please see your follow up appointment listed below.

## 2022-04-18 ENCOUNTER — Encounter: Payer: Self-pay | Admitting: Surgery

## 2022-04-18 NOTE — Progress Notes (Signed)
Surgical Consultation  04/18/2022  Elizabeth Crawford is an 78 y.o. female.   Chief Complaint  Patient presents with   Follow-up    Hiatal hernia     HPI: Elizabeth Crawford is 2 years out from redo paraesophageal hernia repair.  She comes in with some chronic nausea and also fullness.  Have a bad episode last weekend.  She feels better.  She still feels that her p.o. intake is limited.  He has kept her weight.  No fevers no chills she thought she was going to vomit and had some gray color vomit.  Past Medical History:  Diagnosis Date   Allergy    Arthritis    Complication of anesthesia    DVT (deep venous thrombosis) ([place]) 2011   has IVC filter   GERD (gastroesophageal reflux disease)    H/O seasonal allergies    History of hiatal hernia    Hypercholesteremia    PONV (postoperative nausea and vomiting)    Gets sick every time   Pulmonary embolism ([place])    Sleep apnea    Spondylolisthesis     Past Surgical History:  Procedure Laterality Date   BACK SURGERY     x2   BLADDER SUSPENSION  2009   BREAST REDUCTION SURGERY Bilateral 03/01/2019   Procedure: BILATERAL BREAST REDUCTION;  Surgeon: Wallace Going, DO;  Location: [place];  Service: Plastics;  Laterality: Bilateral;  total case 4 hours   CARPAL TUNNEL RELEASE  2003   Left hand   CHOLECYSTECTOMY  2000   Laparoscopic   COLONOSCOPY W/ POLYPECTOMY  2014   COLONOSCOPY WITH PROPOFOL N/A 05/22/2017   Procedure: COLONOSCOPY WITH PROPOFOL;  Surgeon: Lollie Sails, MD;  Location[facility] ENDOSCOPY;  Service: Endoscopy;  Laterality: N/A;   DECOMPRESSIVE LUMBAR LAMINECTOMY LEVEL 2 N/A 11/09/2015   Procedure: DECOMPRESSION L2-5 REMOVAL PEDICULE SCREWS L4-5;  Surgeon: Melina Schools, MD;  Location: [place];  Service: Orthopedics;  Laterality: N/A;   ESOPHAGOGASTRODUODENOSCOPY (EGD) WITH PROPOFOL N/A 10/09/2018   Procedure: ESOPHAGOGASTRODUODENOSCOPY (EGD) WITH PROPOFOL;  Surgeon: Lollie Sails, MD;  Location: [facility] ENDOSCOPY;  Service:  Endoscopy;  Laterality: N/A;   ESOPHAGOGASTRODUODENOSCOPY (EGD) WITH PROPOFOL N/A 05/16/2020   Procedure: ESOPHAGOGASTRODUODENOSCOPY (EGD) WITH PROPOFOL;  Surgeon: Jonathon Bellows, MD;  Location: [facility] ENDOSCOPY;  Service: Gastroenterology;  Laterality: N/A;   EYE SURGERY Bilateral 2005   cataract surgery    HARDWARE REMOVAL N/A 11/09/2015   Procedure: HARDWARE REMOVAL;  Surgeon: Melina Schools, MD;  Location: [place];  Service: Orthopedics;  Laterality: N/A;   HEMORRHOID SURGERY  1978   INSERTION OF MESH  07/19/2021   Procedure: INSERTION OF MESH;  Surgeon: Jules Husbands, MD;  Location: [facility] ORS;  Service: General;;   IVC filter placement  02-01-10   REDUCTION MAMMAPLASTY Bilateral 03/01/2019   SPINE SURGERY  5 23-12   anterolateral fusion/ rodding   TONSILLECTOMY     TUBAL LIGATION  1977   VENTRAL HERNIA REPAIR N/A 07/19/2021   Procedure: HERNIA REPAIR VENTRAL ADULT, incisional repair;  Surgeon: Jules Husbands, MD;  Location: [facility] ORS;  Service: General;  Laterality: N/A;   XI ROBOTIC ASSISTED HIATAL HERNIA REPAIR N/A 05/23/2020   Procedure: XI ROBOTIC ASSISTED HIATAL HERNIA REPAIR with Armida Sans, RNFA to assist;  Surgeon: Jules Husbands, MD;  Location: [facility] ORS;  Service: General;  Laterality: N/A;  Provider requesting 3 hours /180 minutes for procedure    Family History  Problem Relation Age of Onset   COPD Mother  Heart disease Mother    Aortic aneurysm Mother    Heart disease Father     Social History:  reports that she quit smoking about 46 years ago. Her smoking use included cigarettes. She has been exposed to tobacco smoke. She has never used smokeless tobacco. She reports current alcohol use. She reports that she does not use drugs.  Allergies:  Allergies  Allergen Reactions   Sulfa Antibiotics Other (See Comments) and Nausea And Vomiting    UNSPECIFIED   Tape     Paper and adhesive tape make blisters and redness and difficult to remove even on the first day   Elemental  Sulfur Nausea And Vomiting   Methylprednisolone Palpitations   Sulfasalazine Nausea Only    Medications reviewed.     ROS Full ROS performed and is otherwise negative other than what is stated in the HPI    BP 115/83   Pulse 84   Temp 98.2 F (36.8 C) (Oral)   Ht '5\' 4"'$  (1.626 m)   Wt 172 lb (78 kg)   SpO2 95%   BMI 29.52 kg/m   Physical Exam Vitals and nursing note reviewed. Exam conducted with a chaperone present.  Constitutional:      General: She is not in acute distress.    Appearance: Normal appearance. She is normal weight. She is not ill-appearing.  Eyes:     General:        Right eye: No discharge.        Left eye: No discharge.  Cardiovascular:     Rate and Rhythm: Normal rate and regular rhythm.     Heart sounds: No murmur heard. Pulmonary:     Effort: Pulmonary effort is normal. No respiratory distress.     Breath sounds: Normal breath sounds. No stridor. No wheezing or rhonchi.  Abdominal:     General: Abdomen is flat. There is no distension.     Palpations: Abdomen is soft. There is no mass.     Tenderness: There is no abdominal tenderness. There is no guarding or rebound.     Hernia: No hernia is present.     Comments: Incisions healed, no evidence of hernias. Non tender  Musculoskeletal:        General: No swelling or tenderness. Normal range of motion.  Skin:    General: Skin is warm and dry.     Capillary Refill: Capillary refill takes less than 2 seconds.  Neurological:     General: No focal deficit present.     Mental Status: She is alert.  Psychiatric:        Mood and Affect: Mood normal.        Behavior: Behavior normal.        Thought Content: Thought content normal.        Judgment: Judgment normal.    Assessment/Plan: She has some nausea and some chronic abdominal pain and attacks 2 years after redo paraesophageal hernia.  I would like to interrogate the function of the stomach with a gastric emptying study.  I will also like to  have an endoscopic evaluation again to make sure there is no strictures around the wrap.  I spent 30 minutes in this encounter including personally reviewing imaging studies, coordination of her care, counseling, reviewing medical records, placing orders and performing appropriate documentation   Caroleen Hamman, MD [place]

## 2022-04-26 ENCOUNTER — Ambulatory Visit
Admission: RE | Admit: 2022-04-26 | Discharge: 2022-04-26 | Disposition: A | Discharge status: Home / Self Care | Payer: Medicare HMO | Source: Ambulatory Visit | Attending: Surgery | Admitting: Surgery

## 2022-04-26 DIAGNOSIS — R112 Nausea with vomiting, unspecified: Secondary | ICD-10-CM | POA: Diagnosis not present

## 2022-04-26 MED ORDER — TECHNETIUM TC 99M SULFUR COLLOID
2.0000 | Freq: Once | INTRAVENOUS | Status: AC | PRN
  Administered 2022-04-26: 2.12 via ORAL

## 2022-04-29 ENCOUNTER — Telehealth: Payer: Self-pay

## 2022-04-29 NOTE — Telephone Encounter (Signed)
-----   Message from Jules Husbands, MD sent at 04/29/2022  9:36 AM EST ----- Please let her know her stomach is taking its time to empty.  Eat small, frequent meals low fat diet w low fiber. This should help some. ----- Message ----- From: Interface, Rad Results In Sent: 04/26/2022   4:41 PM EST To: Jules Husbands, MD

## 2022-04-29 NOTE — Telephone Encounter (Signed)
Notified patient as instructed, patient pleased. Discussed follow-up appointments, patient agrees  

## 2022-05-13 ENCOUNTER — Ambulatory Visit: Payer: Medicare HMO | Admitting: Gastroenterology

## 2022-05-13 ENCOUNTER — Encounter: Payer: Self-pay | Admitting: Gastroenterology

## 2022-05-13 VITALS — BP 127/84 | HR 83 | Temp 97.8°F | Ht 64.0 in | Wt 173.2 lb

## 2022-05-13 DIAGNOSIS — K3184 Gastroparesis: Secondary | ICD-10-CM | POA: Diagnosis not present

## 2022-05-13 NOTE — Progress Notes (Signed)
zs   Jonathon Bellows MD, MRCP(U.K) [address]  [place]  [place], [place] [address]  Main: [phone]  Fax: [phone]   Primary Care Physician: [place] Pink, MD  Primary Gastroenterologist:  Dr. Jonathon Bellows   Chief Complaint  Patient presents with   Follow-up   Nausea    HPI: Elizabeth Crawford is a 77 y.o. female  Summary of history :   Initially referred and seen in February 2022 for vomiting by Dr. Dahlia Byes.  She was previously seen by Dr. Gustavo Lah at [facility] clinic gastroenterology.  He has a history of a hiatal hernia and chronic issues with microaspiration plan was for an upper endoscopy at that point of time.  Barium swallow in December 2021 demonstrated slipped Nissen with herniation of the remaining upper stomach into the chest with narrowing of the channel of the esophageal hiatus.  Possible esophagitis.  Fold thickening the stomach slightly more irregular than expected for simple esophagitis and endoscopic evaluation was suggested.  Transient arrest of the barium tablet in the mid esophagus at the aortic impression and further evaluation was recommended.  In January 2022 underwent a CT scan of the chest with contrast that showed recurrent small to moderate hiatal hernia mildly patulous thoracic esophagus filled with oral contrast suggesting esophageal dysmotility or GERD.  Multiple bilateral pulmonary nodules.  At the same time also had a CT abdomen as well.   She underwent an endoscopy in July 2020 by Dr. Gustavo Lah noted to have a medium sized hiatal hernia with multiple Cameron ulcers, multiple benign-appearing polyps were seen in the gastric cardia.  When seen by Dr. Dahlia Byes  on 04/17/2020 plan was to perform a revision surgery of the hiatal hernia repair in March 2022 and he had requested an endoscopy prior to that.   She states that after her first surgery for her hernia in 2020 she had significant resolution of her symptoms predominantly aspiration.  Presently she has on  and off symptoms of regurgitation/vomiting usually in the morning when she wakes up and the contents are usually foamy material and no food is included.  Denies any dysphagia.  Denies any weight loss.     05/16/2020: EGD: Features suggestive of Candida esophagitis was seen in the esophagus brushings were taken.  Medium size hiatal hernia was noted.  Multiple 7 to 12 mm sessile polyps were noted in the stomach greater curvature number of them were resected.  Brushings did show yeast with pseudohyphae and she was treated with Diflucan.  Pathology specimen demonstrated the polyps were fundic gland polyps. Subsequently had a redo of paraesophageal hernia repair, continue to have intermittent nausea improved with Zofran.  She was subsequently seen by thoracic surgeon for nausea   She states that since her surgery she has had nausea most days of the week.  She takes a few bites of food and feels like she cannot eat anything more cannot throw up.   11/08/2020: LFT,TSH, H pylori breath test normal     Interval history   11/23/2020-05/13/2022   Gastric emptying study in 04/2022 showed delayed gastric emptying . Seen by Dr Dahlia Byes 04/17/2022 and suggested gastroparesis diet and EGD to rule out strictures around wrap.  Still has not c/o on and off bloating.  Follows a gastroparesis diet.  Not taking rectal had recently.  Uses Zofran when she is sick.  Denies any dysphagia.  Able to belch. Current Outpatient Medications  Medication Sig Dispense Refill   aspirin 81 MG EC tablet Take 81  mg by mouth at bedtime.     cetirizine (ZYRTEC) 10 MG tablet Take 10 mg by mouth daily.     ibuprofen (ADVIL) 200 MG tablet Take 400 mg by mouth every 8 (eight) hours as needed (Back Pain).     omeprazole (PRILOSEC) 40 MG capsule TAKE 1 CAPSULE (40 MG TOTAL) BY MOUTH DAILY. 90 capsule 3   rosuvastatin (CRESTOR) 5 MG tablet Take 5 mg by mouth at bedtime.      metoCLOPramide (REGLAN) 5 MG tablet Take 1 tablet (5 mg total) by mouth 3  (three) times daily as needed for nausea. 30 tablet 0   No current facility-administered medications for this visit.    Allergies as of 05/13/2022 - Review Complete 05/13/2022  Allergen Reaction Noted   Sulfa antibiotics Other (See Comments) and Nausea And Vomiting 02/18/2011   Tape  10/20/2018   Elemental sulfur Nausea And Vomiting 11/28/1998   Methylprednisolone Palpitations 06/30/2015   Sulfasalazine Nausea Only 05/27/2014    ROS:  General: Negative for anorexia, weight loss, fever, chills, fatigue, weakness. ENT: Negative for hoarseness, difficulty swallowing , nasal congestion. CV: Negative for chest pain, angina, palpitations, dyspnea on exertion, peripheral edema.  Respiratory: Negative for dyspnea at rest, dyspnea on exertion, cough, sputum, wheezing.  GI: See history of present illness. GU:  Negative for dysuria, hematuria, urinary incontinence, urinary frequency, nocturnal urination.  Endo: Negative for unusual weight change.    Physical Examination:   BP 127/84   Pulse 83   Temp 97.8 F (36.6 C) (Oral)   Ht 5' 4"$  (1.626 m)   Wt 173 lb 4 oz (78.6 kg)   BMI 29.74 kg/m   General: Well-nourished, well-developed in no acute distress.  Eyes: No icterus. Conjunctivae pink. Mouth: Oropharyngeal mucosa moist and pink , no lesions erythema or exudate.  Neuro: Alert and oriented x 3.  Grossly intact. Skin: Warm and dry, no jaundice.   Psych: Alert and cooperative, normal mood and affect.   Imaging Studies: NM GASTRIC EMPTYING  Result Date: 04/26/2022 CLINICAL DATA:  Nausea and vomiting status post hiatal hernia repair EXAM: NUCLEAR MEDICINE GASTRIC EMPTYING SCAN TECHNIQUE: After oral ingestion of radiolabeled meal, sequential abdominal images were obtained for 4 hours. Percentage of activity emptying the stomach was calculated at 1 hour, 2 hour, 3 hour, and 4 hours. RADIOPHARMACEUTICALS:  2.12 mCi Tc-16msulfur colloid in standardized meal COMPARISON:  CT April 13, 2020 FINDINGS: Expected location of the stomach in the left upper quadrant. Ingested meal empties the stomach gradually over the course of the study. 25% emptied at 1 hr ( normal >= 10%) 53% emptied at 2 hr ( normal >= 40%) 65% emptied at 3 hr ( normal >= 70%) 79% emptied at 4 hr ( normal >= 90%) IMPRESSION: Scintigraphic findings compatible with delayed gastric emptying. Electronically Signed   By: JDahlia BailiffM.D.   On: 04/26/2022 16:39    Assessment and Plan:   Elizabeth MORONGis a 77y.o. y/o female here to follow-up for GERD.  Status post repair of paraesophageal hernia and since then her only symptom has been nausea.she has symptoms suggestive of gastroparesis confirmed with a gastric emptying study.  She has been on a gastroparesis diet.   Plan 1.  Discussed options including gastroparesis diet, changed to liquid diet when she has acute symptoms use Zofran and Reglan sparingly.  2.  Will proceed with EGD to rule out gastric outlet obstruction/gastric bezoar.  She is maintaining her weight   I  have discussed alternative options, risks & benefits,  which include, but are not limited to, bleeding, infection, perforation,respiratory complication & drug reaction.  The patient agrees with this plan & written consent will be obtained.     Dr Jonathon Bellows  MD,MRCP [facility]) Follow up in as needed

## 2022-05-20 ENCOUNTER — Encounter: Payer: Self-pay | Admitting: Gastroenterology

## 2022-05-20 ENCOUNTER — Ambulatory Visit: Payer: Medicare HMO | Admitting: Surgery

## 2022-05-21 ENCOUNTER — Encounter
Admission: RE | Disposition: A | Discharge status: Home / Self Care | Payer: Self-pay | Source: Home / Self Care | Attending: Gastroenterology

## 2022-05-21 ENCOUNTER — Ambulatory Visit: Payer: Medicare HMO | Admitting: Registered Nurse

## 2022-05-21 ENCOUNTER — Ambulatory Visit
Admission: RE | Admit: 2022-05-21 | Discharge: 2022-05-21 | Disposition: A | Discharge status: Home / Self Care | Payer: Medicare HMO | Attending: Gastroenterology | Admitting: Gastroenterology

## 2022-05-21 ENCOUNTER — Encounter: Payer: Self-pay | Admitting: Gastroenterology

## 2022-05-21 DIAGNOSIS — K449 Diaphragmatic hernia without obstruction or gangrene: Secondary | ICD-10-CM | POA: Diagnosis not present

## 2022-05-21 DIAGNOSIS — Z86718 Personal history of other venous thrombosis and embolism: Secondary | ICD-10-CM | POA: Insufficient documentation

## 2022-05-21 DIAGNOSIS — Z9049 Acquired absence of other specified parts of digestive tract: Secondary | ICD-10-CM | POA: Diagnosis not present

## 2022-05-21 DIAGNOSIS — E7800 Pure hypercholesterolemia, unspecified: Secondary | ICD-10-CM | POA: Diagnosis not present

## 2022-05-21 DIAGNOSIS — Z9889 Other specified postprocedural states: Secondary | ICD-10-CM | POA: Diagnosis not present

## 2022-05-21 DIAGNOSIS — K3184 Gastroparesis: Secondary | ICD-10-CM

## 2022-05-21 DIAGNOSIS — Z86711 Personal history of pulmonary embolism: Secondary | ICD-10-CM | POA: Insufficient documentation

## 2022-05-21 DIAGNOSIS — K219 Gastro-esophageal reflux disease without esophagitis: Secondary | ICD-10-CM | POA: Insufficient documentation

## 2022-05-21 DIAGNOSIS — G4730 Sleep apnea, unspecified: Secondary | ICD-10-CM | POA: Diagnosis not present

## 2022-05-21 DIAGNOSIS — Z87891 Personal history of nicotine dependence: Secondary | ICD-10-CM | POA: Insufficient documentation

## 2022-05-21 HISTORY — PX: ESOPHAGOGASTRODUODENOSCOPY: SHX5428

## 2022-05-21 SURGERY — EGD (ESOPHAGOGASTRODUODENOSCOPY)
Anesthesia: General

## 2022-05-21 MED ORDER — SODIUM CHLORIDE 0.9 % IV SOLN: INTRAVENOUS | Status: DC

## 2022-05-21 MED ORDER — PROPOFOL 500 MG/50ML IV EMUL
INTRAVENOUS | Status: DC | PRN
  Administered 2022-05-21: 150 ug/kg/min via INTRAVENOUS

## 2022-05-21 MED ORDER — PROPOFOL 10 MG/ML IV BOLUS
INTRAVENOUS | Status: DC | PRN
  Administered 2022-05-21: 40 mg via INTRAVENOUS

## 2022-05-21 MED ORDER — LIDOCAINE HCL (CARDIAC) PF 100 MG/5ML IV SOSY
PREFILLED_SYRINGE | INTRAVENOUS | Status: DC | PRN
  Administered 2022-05-21: 40 mg via INTRAVENOUS

## 2022-05-21 NOTE — Op Note (Signed)
[facility] Gastroenterology Patient Name: Elizabeth Crawford Procedure Date: 05/21/2022 8:13 AM MRN: [id number] Account #: [id number] Date of Birth: 26-Jan-1946 Admit Type: Outpatient Age: 77 Room: [facility] ENDO ROOM 2 Gender: Female Note Status: Finalized Instrument Name: Upper Endoscope [id number] Procedure:             Upper GI endoscopy Indications:           Follow-up of gastroparesis Providers:             Jonathon Bellows MD, MD Referring MD:          Irven Easterly. Kary Kos, MD (Referring MD) Medicines:             Monitored Anesthesia Care Complications:         No immediate complications. Procedure:             Pre-Anesthesia Assessment:                        - Prior to the procedure, a History and Physical was                         performed, and patient medications, allergies and                         sensitivities were reviewed. The patient's tolerance                         of previous anesthesia was reviewed.                        - The risks and benefits of the procedure and the                         sedation options and risks were discussed with the                         patient. All questions were answered and informed                         consent was obtained.                        - ASA Grade Assessment: II - A patient with mild                         systemic disease.                        After obtaining informed consent, the endoscope was                         passed under direct vision. Throughout the procedure,                         the patient's blood pressure, pulse, and oxygen                         saturations were monitored continuously. The Endoscope  was introduced through the mouth, and advanced to the                         third part of duodenum. The upper GI endoscopy was                         accomplished with ease. The patient tolerated the                         procedure well. Findings:      The  examined duodenum was normal.      The esophagus was normal.      Evidence of a prior Nissen fundoplication was found in the stomach. This       was characterized by healthy appearing mucosa. Impression:            - Normal examined duodenum.                        - Normal esophagus.                        - A Nissen fundoplication was found, characterized by                         healthy appearing mucosa.                        - No specimens collected. Recommendation:        - Discharge patient to home (with escort).                        - Resume previous diet.                        - Continue present medications.                        - Return to my office as previously scheduled. Procedure Code(s):     --- Professional ---                        [phone], Esophagogastroduodenoscopy, flexible,                         transoral; diagnostic, including collection of                         specimen(s) by brushing or washing, when performed                         (separate procedure) Diagnosis Code(s):     --- Professional ---                        [id number], Gastroparesis                        Z98.890, Other specified postprocedural states CPT copyright 2022 [employer]. All rights reserved. The codes documented in this report are preliminary and upon coder review may  be revised to meet current compliance requirements. Jonathon Bellows, MD Jonathon Bellows MD, MD 05/21/2022 8:22:53 AM This  report has been signed electronically. Number of Addenda: 0 Note Initiated On: 05/21/2022 8:13 AM Estimated Blood Loss:  Estimated blood loss: none.      [facility]

## 2022-05-21 NOTE — H&P (Signed)
Elizabeth Bellows, MD [address] [place], [place], [place], [address] [address], [place], [place], [place], [address] Phone: [phone]  Fax: [phone]  Primary Care Physician:  Maryland Pink, MD   Pre-Procedure History & Physical: HPI:  Elizabeth Crawford is a 77 y.o. female is here for an endoscopy    Past Medical History:  Diagnosis Date   Allergy    Arthritis    Complication of anesthesia    DVT (deep venous thrombosis) ([place]) 2011   has IVC filter   GERD (gastroesophageal reflux disease)    H/O seasonal allergies    History of hiatal hernia    Hypercholesteremia    PONV (postoperative nausea and vomiting)    Gets sick every time   Pulmonary embolism ([place])    Sleep apnea    Spondylolisthesis     Past Surgical History:  Procedure Laterality Date   BACK SURGERY     x2   BLADDER SUSPENSION  2009   BREAST REDUCTION SURGERY Bilateral 03/01/2019   Procedure: BILATERAL BREAST REDUCTION;  Surgeon: Wallace Going, DO;  Location: [place];  Service: Plastics;  Laterality: Bilateral;  total case 4 hours   CARPAL TUNNEL RELEASE  2003   Left hand   CHOLECYSTECTOMY  2000   Laparoscopic   COLONOSCOPY W/ POLYPECTOMY  2014   COLONOSCOPY WITH PROPOFOL N/A 05/22/2017   Procedure: COLONOSCOPY WITH PROPOFOL;  Surgeon: Lollie Sails, MD;  Location: [facility] ENDOSCOPY;  Service: Endoscopy;  Laterality: N/A;   DECOMPRESSIVE LUMBAR LAMINECTOMY LEVEL 2 N/A 11/09/2015   Procedure: DECOMPRESSION L2-5 REMOVAL PEDICULE SCREWS L4-5;  Surgeon: Melina Schools, MD;  Location: [place];  Service: Orthopedics;  Laterality: N/A;   ESOPHAGOGASTRODUODENOSCOPY (EGD) WITH PROPOFOL N/A 10/09/2018   Procedure: ESOPHAGOGASTRODUODENOSCOPY (EGD) WITH PROPOFOL;  Surgeon: Lollie Sails, MD;  Location: [facility] ENDOSCOPY;  Service: Endoscopy;  Laterality: N/A;   ESOPHAGOGASTRODUODENOSCOPY (EGD) WITH PROPOFOL N/A 05/16/2020   Procedure: ESOPHAGOGASTRODUODENOSCOPY (EGD) WITH PROPOFOL;  Surgeon: Elizabeth Bellows, MD;  Location: [facility] ENDOSCOPY;  Service: Gastroenterology;  Laterality: N/A;   EYE SURGERY Bilateral 2005   cataract surgery    HARDWARE REMOVAL N/A 11/09/2015   Procedure: HARDWARE REMOVAL;  Surgeon: Melina Schools, MD;  Location: [place];  Service: Orthopedics;  Laterality: N/A;   HEMORRHOID SURGERY  1978   HIATAL HERNIA REPAIR     twice   INSERTION OF MESH  07/19/2021   Procedure: INSERTION OF MESH;  Surgeon: Jules Husbands, MD;  Location: [facility] ORS;  Service: General;;   IVC filter placement  02/01/2010   REDUCTION MAMMAPLASTY Bilateral 03/01/2019   SPINE SURGERY  5 23-12   anterolateral fusion/ rodding   TONSILLECTOMY     TUBAL LIGATION  1977   VENTRAL HERNIA REPAIR N/A 07/19/2021   Procedure: HERNIA REPAIR VENTRAL ADULT, incisional repair;  Surgeon: Jules Husbands, MD;  Location: [facility] ORS;  Service: General;  Laterality: N/A;   XI ROBOTIC ASSISTED HIATAL HERNIA REPAIR N/A 05/23/2020   Procedure: XI ROBOTIC ASSISTED HIATAL HERNIA REPAIR with Armida Sans, RNFA to assist;  Surgeon: Jules Husbands, MD;  Location: [facility] ORS;  Service: General;  Laterality: N/A;  Provider requesting 3 hours /180 minutes for procedure    Prior to Admission medications   Medication Sig Start Date End Date Taking? Authorizing Provider  omeprazole (PRILOSEC) 40 MG capsule TAKE 1 CAPSULE (40 MG TOTAL) BY MOUTH DAILY. 11/12/21  Yes Elizabeth Bellows, MD  rosuvastatin (CRESTOR) 5 MG tablet Take 5 mg  by mouth at bedtime.  04/23/17  Yes [provider]  aspirin 81 MG EC tablet Take 81 mg by mouth at bedtime.    [provider]  cetirizine (ZYRTEC) 10 MG tablet Take 10 mg by mouth daily.    [provider]  ibuprofen (ADVIL) 200 MG tablet Take 400 mg by mouth every 8 (eight) hours as needed (Back Pain).    [provider]  metoCLOPramide (REGLAN) 5 MG tablet Take 1 tablet (5 mg total) by mouth 3 (three) times daily as needed for nausea. 11/08/20 08/01/21  Elizabeth Bellows, MD     Allergies as of 05/13/2022 - Review Complete 05/13/2022  Allergen Reaction Noted   Sulfa antibiotics Other (See Comments) and Nausea And Vomiting 02/18/2011   Tape  10/20/2018   Elemental sulfur Nausea And Vomiting 11/28/1998   Methylprednisolone Palpitations 06/30/2015   Sulfasalazine Nausea Only 05/27/2014    Family History  Problem Relation Age of Onset   COPD Mother    Heart disease Mother    Aortic aneurysm Mother    Heart disease Father     Social History   Socioeconomic History   Marital status: Married    Spouse name: Barbaraann Rondo   Number of children: Not on file   Years of education: Not on file   Highest education level: Not on file  Occupational History   Not on file  Tobacco Use   Smoking status: Former    Years: 15.00    Types: Cigarettes    Quit date: 02/26/1976    Years since quitting: 46.2    Passive exposure: Past   Smokeless tobacco: Never  Vaping Use   Vaping Use: Never used  Substance and Sexual Activity   Alcohol use: Yes    Comment: rarely   Drug use: No   Sexual activity: Not on file  Other Topics Concern   Not on file  Social History Narrative   Not on file   Social Determinants of Health   Financial Resource Strain: Low Risk  (10/27/2018)   Overall Financial Resource Strain (CARDIA)    Difficulty of Paying Living Expenses: Not hard at all  Food Insecurity: No Food Insecurity (10/27/2018)   Hunger Vital Sign    Worried About Running Out of Food in the Last Year: Never true    [place] in the Last Year: Never true  Transportation Needs: No Transportation Needs (10/27/2018)   PRAPARE - Hydrologist (Medical): No    Lack of Transportation (Non-Medical): No  Physical Activity: Unknown (10/27/2018)   Exercise Vital Sign    Days of Exercise per Week: Patient refused    Minutes of Exercise per Session: Patient refused  Stress: No Stress Concern Present (10/27/2018)   [place]    Feeling of Stress : Not at all  Social Connections: Unknown (10/27/2018)   Social Connection and Isolation Panel [NHANES]    Frequency of Communication with Friends and Family: Patient refused    Frequency of Social Gatherings with Friends and Family: Patient refused    Attends Religious Services: Patient refused    Active Member of Clubs or Organizations: Patient refused    Attends Archivist Meetings: Patient refused    Marital Status: Patient refused  Intimate Partner Violence: Not At Risk (10/27/2018)   Humiliation, Afraid, Rape, and Kick questionnaire    Fear of Current or Ex-Partner: No    Emotionally Abused:  No    Physically Abused: No    Sexually Abused: No    Review of Systems: See HPI, otherwise negative ROS  Physical Exam: BP 114/77   Pulse 71   Temp (!) 96.7 F (35.9 C) (Temporal)   Resp 16   Wt 78.7 kg   SpO2 98%   BMI 29.76 kg/m  General:   Alert,  pleasant and cooperative in NAD Head:  Normocephalic and atraumatic. Neck:  Supple; no masses or thyromegaly. Lungs:  Clear throughout to auscultation, normal respiratory effort.    Heart:  +S1, +S2, Regular rate and rhythm, No edema. Abdomen:  Soft, nontender and nondistended. Normal bowel sounds, without guarding, and without rebound.   Neurologic:  Alert and  oriented x4;  grossly normal neurologically.  Impression/Plan: Elizabeth Crawford is here for an endoscopy  to be performed for  evaluation of gastroparesis    Risks, benefits, limitations, and alternatives regarding endoscopy have been reviewed with the patient.  Questions have been answered.  All parties agreeable.   Elizabeth Bellows, MD  05/21/2022, 8:08 AM

## 2022-05-21 NOTE — Anesthesia Preprocedure Evaluation (Signed)
Anesthesia Evaluation  Patient identified by MRN, date of birth, ID band Patient awake    Reviewed: Allergy & Precautions, H&P , NPO status , Patient's Chart, lab work & pertinent test results, reviewed documented beta blocker date and time   History of Anesthesia Complications (+) PONV and history of anesthetic complications  Airway Mallampati: II   Neck ROM: full    Dental  (+) Poor Dentition   Pulmonary sleep apnea , former smoker   Pulmonary exam normal        Cardiovascular Exercise Tolerance: Good negative cardio ROS Normal cardiovascular exam Rhythm:regular Rate:Normal     Neuro/Psych negative neurological ROS  negative psych ROS   GI/Hepatic Neg liver ROS, hiatal hernia,GERD  Medicated,,  Endo/Other  negative endocrine ROS    Renal/GU negative Renal ROS  negative genitourinary   Musculoskeletal   Abdominal   Peds  Hematology negative hematology ROS (+)   Anesthesia Other Findings Past Medical History: No date: Allergy No date: Arthritis No date: Complication of anesthesia 2011: DVT (deep venous thrombosis) ([facility])     Comment:  has IVC filter No date: GERD (gastroesophageal reflux disease) No date: H/O seasonal allergies No date: History of hiatal hernia No date: Hypercholesteremia No date: PONV (postoperative nausea and vomiting)     Comment:  Gets sick every time No date: Pulmonary embolism ([place]) No date: Sleep apnea No date: Spondylolisthesis Past Surgical History: No date: BACK SURGERY     Comment:  x2 2009: BLADDER SUSPENSION 03/01/2019: BREAST REDUCTION SURGERY; Bilateral     Comment:  Procedure: BILATERAL BREAST REDUCTION;  Surgeon:               Wallace Going, DO;  Location: [place];  Service:               Plastics;  Laterality: Bilateral;  total case 4 hours 2003: CARPAL TUNNEL RELEASE     Comment:  Left hand 2000: CHOLECYSTECTOMY     Comment:  Laparoscopic 2014: COLONOSCOPY  W/ POLYPECTOMY 05/22/2017: COLONOSCOPY WITH PROPOFOL; N/A     Comment:  Procedure: COLONOSCOPY WITH PROPOFOL;  Surgeon:               Lollie Sails, MD;  Location: [facility] ENDOSCOPY;                Service: Endoscopy;  Laterality: N/A; 11/09/2015: DECOMPRESSIVE LUMBAR LAMINECTOMY LEVEL 2; N/A     Comment:  Procedure: DECOMPRESSION L2-5 REMOVAL PEDICULE SCREWS               L4-5;  Surgeon: Melina Schools, MD;  Location: [place];                Service: Orthopedics;  Laterality: N/A; 10/09/2018: ESOPHAGOGASTRODUODENOSCOPY (EGD) WITH PROPOFOL; N/A     Comment:  Procedure: ESOPHAGOGASTRODUODENOSCOPY (EGD) WITH               PROPOFOL;  Surgeon: Lollie Sails, MD;  Location:               [facility] ENDOSCOPY;  Service: Endoscopy;  Laterality: N/A; 05/16/2020: ESOPHAGOGASTRODUODENOSCOPY (EGD) WITH PROPOFOL; N/A     Comment:  Procedure: ESOPHAGOGASTRODUODENOSCOPY (EGD) WITH               PROPOFOL;  Surgeon: Jonathon Bellows, MD;  Location: [facility]               ENDOSCOPY;  Service: Gastroenterology;  Laterality: N/A; 2005: EYE SURGERY; Bilateral     Comment:  cataract  surgery  11/09/2015: HARDWARE REMOVAL; N/A     Comment:  Procedure: HARDWARE REMOVAL;  Surgeon: Melina Schools,               MD;  Location: [place];  Service: Orthopedics;  Laterality:              N/A; 1978: HEMORRHOID SURGERY No date: HIATAL HERNIA REPAIR     Comment:  twice 07/19/2021: INSERTION OF MESH     Comment:  Procedure: INSERTION OF MESH;  Surgeon: Jules Husbands,               MD;  Location: [facility] ORS;  Service: General;; 02/01/2010: IVC filter placement 03/01/2019: REDUCTION MAMMAPLASTY; Bilateral 5 23-12: SPINE SURGERY     Comment:  anterolateral fusion/ rodding No date: TONSILLECTOMY 1977: TUBAL LIGATION 07/19/2021: VENTRAL HERNIA REPAIR; N/A     Comment:  Procedure: HERNIA REPAIR VENTRAL ADULT, incisional               repair;  Surgeon: Jules Husbands, MD;  Location: [facility]               ORS;  Service: General;   Laterality: N/A; 05/23/2020: XI ROBOTIC ASSISTED HIATAL HERNIA REPAIR; N/A     Comment:  Procedure: XI ROBOTIC ASSISTED HIATAL HERNIA REPAIR with              Armida Sans, RNFA to assist;  Surgeon: Jules Husbands, MD;  Location: [facility] ORS;  Service: General;                Laterality: N/A;  Provider requesting 3 hours /180               minutes for procedure BMI    Body Mass Index: 29.76 kg/m     Reproductive/Obstetrics negative OB ROS                             Anesthesia Physical Anesthesia Plan  ASA: 3  Anesthesia Plan: General   Post-op Pain Management:    Induction:   PONV Risk Score and Plan:   Airway Management Planned:   Additional Equipment:   Intra-op Plan:   Post-operative Plan:   Informed Consent: I have reviewed the patients History and Physical, chart, labs and discussed the procedure including the risks, benefits and alternatives for the proposed anesthesia with the patient or authorized representative who has indicated his/her understanding and acceptance.     Dental Advisory Given  Plan Discussed with: CRNA  Anesthesia Plan Comments:        Anesthesia Quick Evaluation

## 2022-05-21 NOTE — Transfer of Care (Signed)
Immediate Anesthesia Transfer of Care Note  Patient: Elizabeth Crawford  Procedure(s) Performed: Procedure(s): ESOPHAGOGASTRODUODENOSCOPY (EGD) (N/A)  Patient Location: PACU and Endoscopy Unit  Anesthesia Type:General  Level of Consciousness: sedated  Airway & Oxygen Therapy: Patient Spontanous Breathing and Patient connected to nasal cannula oxygen  Post-op Assessment: Report given to RN and Post -op Vital signs reviewed and stable  Post vital signs: Reviewed and stable  Last Vitals:  Vitals:   05/21/22 0735 05/21/22 0825  BP: 114/77 104/69  Pulse: 71   Resp: 16   Temp: (!) 35.9 C (!) 36.1 C  SpO2: [id number]    Complications: No apparent anesthesia complications

## 2022-05-22 ENCOUNTER — Encounter: Payer: Self-pay | Admitting: Gastroenterology

## 2022-05-27 ENCOUNTER — Ambulatory Visit: Payer: Medicare HMO | Admitting: Surgery

## 2022-05-27 ENCOUNTER — Encounter: Payer: Self-pay | Admitting: Surgery

## 2022-05-27 VITALS — BP 124/77 | HR 91 | Temp 98.0°F | Ht 64.0 in | Wt 173.0 lb

## 2022-05-27 DIAGNOSIS — R112 Nausea with vomiting, unspecified: Secondary | ICD-10-CM | POA: Diagnosis not present

## 2022-05-27 NOTE — Patient Instructions (Signed)
   Follow-up with our office as needed.  Please call and ask to speak with a nurse if you develop questions or concerns.  

## 2022-05-27 NOTE — Anesthesia Postprocedure Evaluation (Signed)
Anesthesia Post Note  Patient: Elizabeth Crawford  Procedure(s) Performed: ESOPHAGOGASTRODUODENOSCOPY (EGD)  Patient location during evaluation: PACU Anesthesia Type: General Level of consciousness: awake and alert Pain management: pain level controlled Vital Signs Assessment: post-procedure vital signs reviewed and stable Respiratory status: spontaneous breathing, nonlabored ventilation, respiratory function stable and patient connected to nasal cannula oxygen Cardiovascular status: blood pressure returned to baseline and stable Postop Assessment: no apparent nausea or vomiting Anesthetic complications: no   No notable events documented.   Last Vitals:  Vitals:   05/21/22 0842 05/21/22 0845  BP: 106/65 108/70  Pulse: 60 60  Resp: (!) 22 15  Temp:    SpO2: 99% 95%    Last Pain:  Vitals:   05/21/22 0845  TempSrc:   PainSc: 0-No pain                 [name]

## 2022-05-30 NOTE — Progress Notes (Signed)
Outpatient Surgical Follow Up  05/30/2022  Elizabeth Crawford is an 77 y.o. female.   Chief Complaint  Patient presents with   Follow-up    HPI:  Elizabeth Crawford is 2 years out from redo paraesophageal hernia repair.  She comes in with some chronic nausea and also fullness.    She has good days and bad days .  She still feels that her p.o. intake is limited. SHe has kept her weight.  No fevers no chills she thought she was going to vomit and had some gray color vomit.  Recently had an EGD performed by Dr. Vicente Males which I personally reviewed showing no evidence of hiatal hernia recurrence and an intact wrapped. Also perform gastric emptying study showing evidence of delayed gastric emptying.  I have also personally reviewed the study. Respiratory symptoms which was the reason for the repair are good and she has noticed a dramatic difference after procedure    Past Medical History:  Diagnosis Date   Allergy    Arthritis    Complication of anesthesia    DVT (deep venous thrombosis) ([place]) 2011   has IVC filter   GERD (gastroesophageal reflux disease)    H/O seasonal allergies    History of hiatal hernia    Hypercholesteremia    PONV (postoperative nausea and vomiting)    Gets sick every time   Pulmonary embolism ([place])    Sleep apnea    Spondylolisthesis     Past Surgical History:  Procedure Laterality Date   BACK SURGERY     x2   BLADDER SUSPENSION  2009   BREAST REDUCTION SURGERY Bilateral 03/01/2019   Procedure: BILATERAL BREAST REDUCTION;  Surgeon: Wallace Going, DO;  Location: [place];  Service: Plastics;  Laterality: Bilateral;  total case 4 hours   CARPAL TUNNEL RELEASE  2003   Left hand   CHOLECYSTECTOMY  2000   Laparoscopic   COLONOSCOPY W/ POLYPECTOMY  2014   COLONOSCOPY WITH PROPOFOL N/A 05/22/2017   Procedure: COLONOSCOPY WITH PROPOFOL;  Surgeon: Lollie Sails, MD;  Location: [facility] ENDOSCOPY;  Service: Endoscopy;  Laterality: N/A;   DECOMPRESSIVE LUMBAR  LAMINECTOMY LEVEL 2 N/A 11/09/2015   Procedure: DECOMPRESSION L2-5 REMOVAL PEDICULE SCREWS L4-5;  Surgeon: Melina Schools, MD;  Location: [place];  Service: Orthopedics;  Laterality: N/A;   ESOPHAGOGASTRODUODENOSCOPY N/A 05/21/2022   Procedure: ESOPHAGOGASTRODUODENOSCOPY (EGD);  Surgeon: Jonathon Bellows, MD;  Location: [facility] ENDOSCOPY;  Service: Gastroenterology;  Laterality: N/A;   ESOPHAGOGASTRODUODENOSCOPY (EGD) WITH PROPOFOL N/A 10/09/2018   Procedure: ESOPHAGOGASTRODUODENOSCOPY (EGD) WITH PROPOFOL;  Surgeon: Lollie Sails, MD;  Location: [facility] ENDOSCOPY;  Service: Endoscopy;  Laterality: N/A;   ESOPHAGOGASTRODUODENOSCOPY (EGD) WITH PROPOFOL N/A 05/16/2020   Procedure: ESOPHAGOGASTRODUODENOSCOPY (EGD) WITH PROPOFOL;  Surgeon: Jonathon Bellows, MD;  Location: [facility] ENDOSCOPY;  Service: Gastroenterology;  Laterality: N/A;   EYE SURGERY Bilateral 2005   cataract surgery    HARDWARE REMOVAL N/A 11/09/2015   Procedure: HARDWARE REMOVAL;  Surgeon: Melina Schools, MD;  Location: [place];  Service: Orthopedics;  Laterality: N/A;   HEMORRHOID SURGERY  1978   HIATAL HERNIA REPAIR     twice   INSERTION OF MESH  07/19/2021   Procedure: INSERTION OF MESH;  Surgeon: Jules Husbands, MD;  Location: [facility];  Service: General;;   IVC filter placement  02/01/2010   REDUCTION MAMMAPLASTY Bilateral 03/01/2019   SPINE SURGERY  5 23-12   anterolateral fusion/ rodding   TONSILLECTOMY     TUBAL LIGATION  1977   VENTRAL  HERNIA REPAIR N/A 07/19/2021   Procedure: HERNIA REPAIR VENTRAL ADULT, incisional repair;  Surgeon: Jules Husbands, MD;  Location: [facility];  Service: General;  Laterality: N/A;   XI ROBOTIC ASSISTED HIATAL HERNIA REPAIR N/A 05/23/2020   Procedure: XI ROBOTIC ASSISTED HIATAL HERNIA REPAIR with Armida Sans, RNFA to assist;  Surgeon: Jules Husbands, MD;  Location: [facility] ORS;  Service: General;  Laterality: N/A;  Provider requesting 3 hours /180 minutes for procedure    Family History  Problem Relation  Age of Onset   COPD Mother    Heart disease Mother    Aortic aneurysm Mother    Heart disease Father     Social History:  reports that she quit smoking about 46 years ago. Her smoking use included cigarettes. She has been exposed to tobacco smoke. She has never used smokeless tobacco. She reports current alcohol use. She reports that she does not use drugs.  Allergies:  Allergies  Allergen Reactions   Sulfa Antibiotics Other (See Comments) and Nausea And Vomiting    UNSPECIFIED   Tape     Paper and adhesive tape make blisters and redness and difficult to remove even on the first day   Elemental Sulfur Nausea And Vomiting   Methylprednisolone Palpitations   Sulfasalazine Nausea Only    Medications reviewed.    ROS Full ROS performed and is otherwise negative other than what is stated in HPI   BP 124/77   Pulse 91   Temp 98 F (36.7 C)   Ht '5\' 4"'$  (1.626 m)   Wt 173 lb (78.5 kg)   SpO2 98%   BMI 29.70 kg/m   Physical Exam Constitutional:      General: She is not in acute distress.    Appearance: Normal appearance. She is normal weight. She is not ill-appearing.  Eyes:     General:        Right eye: No discharge.        Left eye: No discharge.  Cardiovascular:     Rate and Rhythm: Normal rate and regular rhythm.     Heart sounds: No murmur heard. Pulmonary:     Effort: Pulmonary effort is normal. No respiratory distress.     Breath sounds: Normal breath sounds. No stridor. No wheezing or rhonchi.  Abdominal:     General: Abdomen is flat. There is no distension.     Palpations: Abdomen is soft. There is no mass.     Tenderness: There is no abdominal tenderness. There is no guarding or rebound.     Hernia: No hernia is present.     Comments: Incisions healed, no evidence of hernias. Non tender  Musculoskeletal:        General: No swelling or tenderness. Normal range of motion.  Skin:    General: Skin is warm and dry.     Capillary Refill: Capillary refill  takes less than 2 seconds.  Neurological:     General: No focal deficit present.     Mental Status: She is alert.  Psychiatric:        Mood and Affect: Mood normal.        Behavior: Behavior normal.        Thought Content: Thought content normal.        Judgment: Judgment normal.       Assessment/Plan:  78 year old female with chronic nausea and likely related to delayed gastric emptying.  Discussed with the patient in detail about diet modification.  If does  not improve may need to refer to a tertiary center for further w/u. I advised against any redo foregut surgery We will see prn Spent30 minutes in this encounter including personally reviewing imaging studies, coordinating her care, counseling the patient and performing appropriate documentation   Caroleen Hamman, MD [place] Surgeon

## 2022-09-09 ENCOUNTER — Ambulatory Visit (INDEPENDENT_AMBULATORY_CARE_PROVIDER_SITE_OTHER): Payer: Medicare HMO

## 2022-09-09 ENCOUNTER — Encounter (INDEPENDENT_AMBULATORY_CARE_PROVIDER_SITE_OTHER): Payer: Self-pay | Admitting: Vascular Surgery

## 2022-09-09 ENCOUNTER — Ambulatory Visit (INDEPENDENT_AMBULATORY_CARE_PROVIDER_SITE_OTHER): Payer: Medicare HMO | Admitting: Vascular Surgery

## 2022-09-09 VITALS — BP 123/78 | HR 65 | Resp 16 | Wt 173.4 lb

## 2022-09-09 DIAGNOSIS — Z86718 Personal history of other venous thrombosis and embolism: Secondary | ICD-10-CM

## 2022-09-09 DIAGNOSIS — I872 Venous insufficiency (chronic) (peripheral): Secondary | ICD-10-CM

## 2022-09-09 DIAGNOSIS — K219 Gastro-esophageal reflux disease without esophagitis: Secondary | ICD-10-CM | POA: Diagnosis not present

## 2022-09-09 DIAGNOSIS — E7849 Other hyperlipidemia: Secondary | ICD-10-CM | POA: Diagnosis not present

## 2022-09-09 NOTE — Progress Notes (Signed)
MRN : [id number]  Elizabeth Crawford is a 77 y.o. (10-11-1945) female who presents with chief complaint of legs hurt and swell.  History of Present Illness:   The patient returns to the office for follow up regarding her past DVT.  DVT was identified at [facility] by Duplex ultrasound remotely.  The initial symptoms were pain and swelling in the lower extremity.   At that time she had a filter placed at that time. Several months later attempts at removing the filter were unsuccessful. Since then we have been monitoring the patency of the inferior vena cava.   The patient notes her legs have some pain with dependency and continue to swell as the day goes on.  She is noting an increasing number and size of her varicose veins.  Symptoms are much better with compression and elevation.  The patient notes minimal edema in the morning which steadily increases throughout the day.     The patient has been using compression on a daily at this point.   The patient is status post Nissen fundoplication as well as breast reduction secondary to severe respiratory symptoms.  She did not have any problems with either surgery.  She notes that her symptoms are dramatically improved having completed these 2 procedures.   No SOB or pleuritic chest pains.  No cough or hemoptysis.   No blood per rectum or blood in any sputum.  No excessive bruising per the patient.    Duplex ultrasound of the inferior vena cava done today shows the filter appears to be patent and normal flow is noted throughout the inferior vena cava cava  Current Meds  Medication Sig   aspirin 81 MG EC tablet Take 81 mg by mouth at bedtime.   cetirizine (ZYRTEC) 10 MG tablet Take 10 mg by mouth daily.   ibuprofen (ADVIL) 200 MG tablet Take 400 mg by mouth every 8 (eight) hours as needed (Back Pain).   omeprazole (PRILOSEC) 40 MG capsule TAKE 1 CAPSULE (40 MG TOTAL) BY MOUTH DAILY.   predniSONE (DELTASONE) 10 MG tablet Take tablets (20 mg  total) x 5 days then one tablet (10 mg total) x 5 days   rosuvastatin (CRESTOR) 5 MG tablet Take 5 mg by mouth at bedtime.     Past Medical History:  Diagnosis Date   Allergy    Arthritis    Complication of anesthesia    DVT (deep venous thrombosis) (HCC) 2011   has IVC filter   GERD (gastroesophageal reflux disease)    H/O seasonal allergies    History of hiatal hernia    Hypercholesteremia    PONV (postoperative nausea and vomiting)    Gets sick every time   Pulmonary embolism (HCC)    Sleep apnea    Spondylolisthesis     Past Surgical History:  Procedure Laterality Date   BACK SURGERY     x2   BLADDER SUSPENSION  2009   BREAST REDUCTION SURGERY Bilateral 03/01/2019   Procedure: BILATERAL BREAST REDUCTION;  Surgeon: Peggye Form, DO;  Location: [facility] OR;  Service: Plastics;  Laterality: Bilateral;  total case 4 hours   CARPAL TUNNEL RELEASE  2003   Left hand   CHOLECYSTECTOMY  2000   Laparoscopic   COLONOSCOPY W/ POLYPECTOMY  2014   COLONOSCOPY WITH PROPOFOL N/A 05/22/2017   Procedure: COLONOSCOPY WITH PROPOFOL;  Surgeon: Christena Deem, MD;  Location[facility] ENDOSCOPY;  Service: Endoscopy;  Laterality: N/A;  DECOMPRESSIVE LUMBAR LAMINECTOMY LEVEL 2 N/A 11/09/2015   Procedure: DECOMPRESSION L2-5 REMOVAL PEDICULE SCREWS L4-5;  Surgeon: Venita Lick, MD;  Location: [facility] OR;  Service: Orthopedics;  Laterality: N/A;   ESOPHAGOGASTRODUODENOSCOPY N/A 05/21/2022   Procedure: ESOPHAGOGASTRODUODENOSCOPY (EGD);  Surgeon: Wyline Mood, MD;  Location[facility] ENDOSCOPY;  Service: Gastroenterology;  Laterality: N/A;   ESOPHAGOGASTRODUODENOSCOPY (EGD) WITH PROPOFOL N/A 10/09/2018   Procedure: ESOPHAGOGASTRODUODENOSCOPY (EGD) WITH PROPOFOL;  Surgeon: Christena Deem, MD;  Location: [facility] ENDOSCOPY;  Service: Endoscopy;  Laterality: N/A;   ESOPHAGOGASTRODUODENOSCOPY (EGD) WITH PROPOFOL N/A 05/16/2020   Procedure: ESOPHAGOGASTRODUODENOSCOPY (EGD) WITH PROPOFOL;  Surgeon: Wyline Mood,  MD;  Location: [facility] ENDOSCOPY;  Service: Gastroenterology;  Laterality: N/A;   EYE SURGERY Bilateral 2005   cataract surgery    HARDWARE REMOVAL N/A 11/09/2015   Procedure: HARDWARE REMOVAL;  Surgeon: Venita Lick, MD;  Location: [facility] OR;  Service: Orthopedics;  Laterality: N/A;   HEMORRHOID SURGERY  1978   HIATAL HERNIA REPAIR     twice   INSERTION OF MESH  07/19/2021   Procedure: INSERTION OF MESH;  Surgeon: Leafy Ro, MD;  Location: [facility] ORS;  Service: General;;   IVC filter placement  02/01/2010   REDUCTION MAMMAPLASTY Bilateral 03/01/2019   SPINE SURGERY  5 23-12   anterolateral fusion/ rodding   TONSILLECTOMY     TUBAL LIGATION  1977   VENTRAL HERNIA REPAIR N/A 07/19/2021   Procedure: HERNIA REPAIR VENTRAL ADULT, incisional repair;  Surgeon: Leafy Ro, MD;  Location: [facility] ORS;  Service: General;  Laterality: N/A;   XI ROBOTIC ASSISTED HIATAL HERNIA REPAIR N/A 05/23/2020   Procedure: XI ROBOTIC ASSISTED HIATAL HERNIA REPAIR with Adrianne Allred, RNFA to assist;  Surgeon: Leafy Ro, MD;  Location: [facility] ORS;  Service: General;  Laterality: N/A;  Provider requesting 3 hours /180 minutes for procedure    Social History Social History   Tobacco Use   Smoking status: Former    Years: 15    Types: Cigarettes    Quit date: 02/26/1976    Years since quitting: 46.5    Passive exposure: Past   Smokeless tobacco: Never  Vaping Use   Vaping Use: Never used  Substance Use Topics   Alcohol use: Yes    Comment: rarely   Drug use: No    Family History Family History  Problem Relation Age of Onset   COPD Mother    Heart disease Mother    Aortic aneurysm Mother    Heart disease Father     Allergies  Allergen Reactions   Sulfa Antibiotics Other (See Comments) and Nausea And Vomiting    UNSPECIFIED   Tape     Paper and adhesive tape make blisters and redness and difficult to remove even on the first day   Elemental Sulfur Nausea And Vomiting   Methylprednisolone  Palpitations   Sulfasalazine Nausea Only     REVIEW OF SYSTEMS (Negative unless checked)  Constitutional: [] Weight loss  [] Fever  [] Chills Cardiac: [] Chest pain   [] Chest pressure   [] Palpitations   [] Shortness of breath when laying flat   [] Shortness of breath with exertion. Vascular:  [] Pain in legs with walking   [x] Pain in legs at rest  [x] History of DVT   [] Phlebitis   [x] Swelling in legs   [] Varicose veins   [] Non-healing ulcers Pulmonary:   [] Uses home oxygen   [] Productive cough   [] Hemoptysis   [] Wheeze  [] COPD   [] Asthma Neurologic:  [] Dizziness   [] Seizures   [] History of  stroke   [] History of TIA  [] Aphasia   [] Vissual changes   [] Weakness or numbness in arm   [] Weakness or numbness in leg Musculoskeletal:   [] Joint swelling   [] Joint pain   [] Low back pain Hematologic:  [] Easy bruising  [] Easy bleeding   [] Hypercoagulable state   [] Anemic Gastrointestinal:  [] Diarrhea   [] Vomiting  [x] Gastroesophageal reflux/heartburn   [] Difficulty swallowing. Genitourinary:  [] Chronic kidney disease   [] Difficult urination  [] Frequent urination   [] Blood in urine Skin:  [] Rashes   [] Ulcers  Psychological:  [] History of anxiety   []  History of major depression.  Physical Examination  Vitals:   09/09/22 0856  BP: 123/78  Pulse: 65  Resp: 16  Weight: 173 lb 6.4 oz (78.7 kg)   Body mass index is 29.76 kg/m. Gen: WD/WN, NAD Head: [place]/AT, No temporalis wasting.  Ear/Nose/Throat: Hearing grossly intact, nares w/o erythema or drainage, pinna without lesions Eyes: PER, EOMI, sclera nonicteric.  Neck: Supple, no gross masses.  No JVD.  Pulmonary:  Good air movement, no audible wheezing, no use of accessory muscles.  Cardiac: RRR, precordium not hyperdynamic. Vascular:  scattered varicosities present bilaterally.  Moderate venous stasis changes to the legs bilaterally.  2+ soft pitting edema. CEAP [id number]   Vessel Right Left  Radial Palpable Palpable  Gastrointestinal: soft,  non-distended. No guarding/no peritoneal signs.  Musculoskeletal: M/S 5/5 throughout.  No deformity.  Neurologic: CN 2-12 intact. Pain and light touch intact in extremities.  Symmetrical.  Speech is fluent. Motor exam as listed above. Psychiatric: Judgment intact, Mood & affect appropriate for pt's clinical situation. Dermatologic: Venous rashes no ulcers noted.  No changes consistent with cellulitis. Lymph : No lichenification or skin changes of chronic lymphedema.  CBC Lab Results  Component Value Date   WBC 5.9 07/12/2021   HGB 12.1 07/12/2021   HCT 37.6 07/12/2021   MCV 94.9 07/12/2021   PLT 129 (L) 07/12/2021    BMET    Component Value Date/Time   NA 138 07/12/2021 1150   K 3.6 07/12/2021 1150   CL 106 07/12/2021 1150   CO2 26 07/12/2021 1150   GLUCOSE 78 07/12/2021 1150   BUN 13 07/12/2021 1150   CREATININE 0.82 07/12/2021 1150   CALCIUM 9.2 07/12/2021 1150   GFRNONAA >60 07/12/2021 1150   GFRAA >60 01/28/2019 1030   CrCl cannot be calculated (Patient's most recent lab result is older than the maximum 21 days allowed.).  COAG Lab Results  Component Value Date   INR 1.17 08/18/2010   INR 1.09 08/17/2010   INR 1.07 08/15/2010    Radiology No results found.   Assessment/Plan 1. Chronic venous insufficiency Recommend:   No surgery or intervention at this point in time. IVC filter is present and was unable to be retrieved..   Patient's duplex ultrasound of the venous system shows the IVC is widely patent.   The patient has completed her anticoagulation   Elevation was stressed, use of a recliner was discussed.   I have had a long discussion with the patient regarding DVT and post phlebitic changes such as swelling and why it  causes symptoms such as pain.  The patient will wear graduated compression stockings class 1 (20-30 mmHg), beginning after three full days of anticoagulation, on a daily basis a prescription was given. The patient will  beginning  wearing the stockings first thing in the morning and removing them in the evening. The patient is instructed specifically not to sleep in the stockings.  In addition, behavioral modification including elevation during the day and avoidance of prolonged dependency will be initiated.     The patient will continue anticoagulation for now as there have not been any problems or complications at this point.  - VAS US AORTA/IVC/ILIACS; Future  2. History of DVT (deep vein thrombosis) Recommend:   No surgery or intervention at this point in time. IVC filter is present and was unable to be retrieved..   Patient's duplex ultrasound of the venous system shows the IVC is widely patent.   The patient has completed her anticoagulation   Elevation was stressed, use of a recliner was discussed.   I have had a long discussion with the patient regarding DVT and post phlebitic changes such as swelling and why it  causes symptoms such as pain.  The patient will wear graduated compression stockings class 1 (20-30 mmHg), beginning after three full days of anticoagulation, on a daily basis a prescription was given. The patient will  beginning wearing the stockings first thing in the morning and removing them in the evening. The patient is instructed specifically not to sleep in the stockings.  In addition, behavioral modification including elevation during the day and avoidance of prolonged dependency will be initiated.     The patient will continue anticoagulation for now as there have not been any problems or complications at this point.  - VAS US AORTA/IVC/ILIACS; Future  3. Gastroesophageal reflux disease without esophagitis Continue PPI as already ordered, this medication has been reviewed and there are no changes at this time.  Avoidence of caffeine and alcohol  Moderate elevation of the head of the bed   4. Other hyperlipidemia Continue statin as ordered and reviewed, no changes at this  time    Levora Dredge, MD  09/09/2022 9:07 AM

## 2022-09-14 ENCOUNTER — Encounter (INDEPENDENT_AMBULATORY_CARE_PROVIDER_SITE_OTHER): Payer: Self-pay | Admitting: Vascular Surgery

## 2022-11-09 ENCOUNTER — Other Ambulatory Visit: Payer: Self-pay | Admitting: Gastroenterology

## 2023-01-02 ENCOUNTER — Other Ambulatory Visit: Payer: Self-pay | Admitting: Orthopedic Surgery

## 2023-01-02 DIAGNOSIS — K449 Diaphragmatic hernia without obstruction or gangrene: Secondary | ICD-10-CM

## 2023-02-12 ENCOUNTER — Inpatient Hospital Stay
Admission: RE | Admit: 2023-02-12 | Discharge: 2023-02-12 | Disposition: A | Discharge status: Home / Self Care | Payer: Medicare HMO | Source: Ambulatory Visit | Attending: Orthopedic Surgery | Admitting: Orthopedic Surgery

## 2023-02-12 DIAGNOSIS — K449 Diaphragmatic hernia without obstruction or gangrene: Secondary | ICD-10-CM

## 2023-03-14 ENCOUNTER — Encounter: Payer: Self-pay | Admitting: Thoracic Surgery (Cardiothoracic Vascular Surgery)

## 2023-03-14 ENCOUNTER — Institutional Professional Consult (permissible substitution): Payer: Medicare HMO | Admitting: Thoracic Surgery (Cardiothoracic Vascular Surgery)

## 2023-03-14 VITALS — BP 136/82 | HR 70 | Resp 20 | Ht 64.0 in | Wt 177.4 lb

## 2023-03-14 DIAGNOSIS — Z9889 Other specified postprocedural states: Secondary | ICD-10-CM

## 2023-03-14 DIAGNOSIS — Z8719 Personal history of other diseases of the digestive system: Secondary | ICD-10-CM | POA: Diagnosis not present

## 2023-03-14 NOTE — Progress Notes (Signed)
[address]       [place] [address]             [phone]                    [name] [facility] Medical Record #[id number] Date of Birth: February 12, 1946  Referring: Venita Lick, MD Primary Care: Jerl Mina, MD Primary Cardiologist: None  Chief Complaint:    Chief Complaint  Patient presents with   Hiatal Hernia    Second opinion, chest CT 11/20    History of Present Illness:    Elizabeth Crawford 77 y.o. female presents for second opinion on her nausea and vomiting.  She had a paraesophageal hernia repair in 2020 which required a colon resection.  Due to recurrence, she required a second repair with mesh placement 2022.  Since that point she complains of nausea and vomiting.  Over the last several weeks her symptoms have improved.     Past Medical History:  Diagnosis Date   Allergy    Arthritis    Complication of anesthesia    DVT (deep venous thrombosis) (HCC) 2011   has IVC filter   GERD (gastroesophageal reflux disease)    H/O seasonal allergies    History of hiatal hernia    Hypercholesteremia    PONV (postoperative nausea and vomiting)    Gets sick every time   Pulmonary embolism (HCC)    Sleep apnea    Spondylolisthesis     Past Surgical History:  Procedure Laterality Date   BACK SURGERY     x2   BLADDER SUSPENSION  2009   BREAST REDUCTION SURGERY Bilateral 03/01/2019   Procedure: BILATERAL BREAST REDUCTION;  Surgeon: Peggye Form, DO;  Location: [facility] OR;  Service: Plastics;  Laterality: Bilateral;  total case 4 hours   CARPAL TUNNEL RELEASE  2003   Left hand   CHOLECYSTECTOMY  2000   Laparoscopic   COLONOSCOPY W/ POLYPECTOMY  2014   COLONOSCOPY WITH PROPOFOL N/A 05/22/2017   Procedure: COLONOSCOPY WITH PROPOFOL;  Surgeon: Christena Deem, MD;  Location: [facility] ENDOSCOPY;  Service: Endoscopy;  Laterality: N/A;   DECOMPRESSIVE LUMBAR LAMINECTOMY LEVEL 2 N/A 11/09/2015   Procedure: DECOMPRESSION L2-5 REMOVAL  PEDICULE SCREWS L4-5;  Surgeon: Venita Lick, MD;  Location: [facility] OR;  Service: Orthopedics;  Laterality: N/A;   ESOPHAGOGASTRODUODENOSCOPY N/A 05/21/2022   Procedure: ESOPHAGOGASTRODUODENOSCOPY (EGD);  Surgeon: Wyline Mood, MD;  Location: [facility] ENDOSCOPY;  Service: Gastroenterology;  Laterality: N/A;   ESOPHAGOGASTRODUODENOSCOPY (EGD) WITH PROPOFOL N/A 10/09/2018   Procedure: ESOPHAGOGASTRODUODENOSCOPY (EGD) WITH PROPOFOL;  Surgeon: Christena Deem, MD;  Location: [facility] ENDOSCOPY;  Service: Endoscopy;  Laterality: N/A;   ESOPHAGOGASTRODUODENOSCOPY (EGD) WITH PROPOFOL N/A 05/16/2020   Procedure: ESOPHAGOGASTRODUODENOSCOPY (EGD) WITH PROPOFOL;  Surgeon: Wyline Mood, MD;  Location: [facility] ENDOSCOPY;  Service: Gastroenterology;  Laterality: N/A;   EYE SURGERY Bilateral 2005   cataract surgery    HARDWARE REMOVAL N/A 11/09/2015   Procedure: HARDWARE REMOVAL;  Surgeon: Venita Lick, MD;  Location: [facility] OR;  Service: Orthopedics;  Laterality: N/A;   HEMORRHOID SURGERY  1978   HIATAL HERNIA REPAIR     twice   INSERTION OF MESH  07/19/2021   Procedure: INSERTION OF MESH;  Surgeon: Leafy Ro, MD;  Location: [facility] ORS;  Service: General;;   IVC filter placement  02/01/2010   REDUCTION MAMMAPLASTY Bilateral 03/01/2019   SPINE SURGERY  5 23-12   anterolateral fusion/ rodding  TONSILLECTOMY     TUBAL LIGATION  1977   VENTRAL HERNIA REPAIR N/A 07/19/2021   Procedure: HERNIA REPAIR VENTRAL ADULT, incisional repair;  Surgeon: Leafy Ro, MD;  Location: [facility] ORS;  Service: General;  Laterality: N/A;   XI ROBOTIC ASSISTED HIATAL HERNIA REPAIR N/A 05/23/2020   Procedure: XI ROBOTIC ASSISTED HIATAL HERNIA REPAIR with Sharyn Blitz, RNFA to assist;  Surgeon: Leafy Ro, MD;  Location: [facility] ORS;  Service: General;  Laterality: N/A;  Provider requesting 3 hours /180 minutes for procedure    Family History  Problem Relation Age of Onset   COPD Mother    Heart disease Mother    Aortic aneurysm  Mother    Heart disease Father      Social History   Tobacco Use  Smoking Status Former   Current packs/day: 0.00   Types: Cigarettes   Start date: 02/25/1961   Quit date: 02/26/1976   Years since quitting: 47.0   Passive exposure: Past  Smokeless Tobacco Never    Social History   Substance and Sexual Activity  Alcohol Use Yes   Comment: rarely     Allergies  Allergen Reactions   Sulfa Antibiotics Other (See Comments) and Nausea And Vomiting    UNSPECIFIED   Tape     Paper and adhesive tape make blisters and redness and difficult to remove even on the first day   Elemental Sulfur Nausea And Vomiting   Methylprednisolone Palpitations   Sulfasalazine Nausea Only    Current Outpatient Medications  Medication Sig Dispense Refill   aspirin 81 MG EC tablet Take 81 mg by mouth at bedtime.     cetirizine (ZYRTEC) 10 MG tablet Take 10 mg by mouth daily.     ibuprofen (ADVIL) 200 MG tablet Take 400 mg by mouth every 8 (eight) hours as needed (Back Pain).     omeprazole (PRILOSEC) 40 MG capsule TAKE 1 CAPSULE (40 MG TOTAL) BY MOUTH DAILY. 90 capsule 3   rosuvastatin (CRESTOR) 5 MG tablet Take 5 mg by mouth at bedtime.      metoCLOPramide (REGLAN) 5 MG tablet Take 1 tablet (5 mg total) by mouth 3 (three) times daily as needed for nausea. 30 tablet 0   No current facility-administered medications for this visit.    Review of Systems  Constitutional:  Negative for malaise/fatigue and weight loss.  Respiratory:  Negative for cough and shortness of breath.   Cardiovascular:  Negative for chest pain.  Gastrointestinal:  Positive for abdominal pain, diarrhea, nausea and vomiting. Negative for heartburn.  Neurological: Negative.      PHYSICAL EXAMINATION: BP 136/82 (BP Location: Right Arm, Patient Position: Sitting, Cuff Size: Normal)   Pulse 70   Resp 20   Ht 5\' 4"  (1.626 m)   Wt 177 lb 6.4 oz (80.5 kg)   SpO2 95% Comment: RA  BMI 30.45 kg/m  Physical  Exam Constitutional:      General: She is not in acute distress. HENT:     Head: Normocephalic and atraumatic.  Eyes:     Extraocular Movements: Extraocular movements intact.  Cardiovascular:     Rate and Rhythm: Normal rate.  Pulmonary:     Effort: Pulmonary effort is normal. No respiratory distress.  Abdominal:     General: There is no distension.  Musculoskeletal:        General: Normal range of motion.  Skin:    General: Skin is warm and dry.  Neurological:     General: No  focal deficit present.     Mental Status: She is alert and oriented to person, place, and time.          I have independently reviewed the above radiology studies  and reviewed the findings with the patient.   Recent Lab Findings: Lab Results  Component Value Date   WBC 5.9 07/12/2021   HGB 12.1 07/12/2021   HCT 37.6 07/12/2021   PLT 129 (L) 07/12/2021   GLUCOSE 78 07/12/2021   ALT 15 11/08/2020   AST 20 11/08/2020   NA 138 07/12/2021   K 3.6 07/12/2021   CL 106 07/12/2021   CREATININE 0.82 07/12/2021   BUN 13 07/12/2021   CO2 26 07/12/2021   TSH 3.750 11/08/2020   INR 1.17 08/18/2010      Assessment / Plan:   77 year old female with history of hiatal hernia repair x 2 with concern for gastroparesis.  Personally reviewed her EGD, and CT scan.  She only has a small hiatal hernia on imaging, and no evidence of hernia.  EGD from February of this year.  I do think that most of her symptoms are related to gastroparesis.  This appears to be getting better.  I will touch base with her gastroenterologist to see if there is anything that can be done to assist from this.  I do not see a surgical reason from a thoracic standpoint that we will assist with her symptoms.     I  spent 40 minutes with the patient face to face counseling and coordination of care.    Corliss Skains 03/14/2023 1:01 PM

## 2023-04-09 DIAGNOSIS — G4733 Obstructive sleep apnea (adult) (pediatric): Secondary | ICD-10-CM | POA: Diagnosis not present

## 2023-05-19 DIAGNOSIS — Z9849 Cataract extraction status, unspecified eye: Secondary | ICD-10-CM | POA: Diagnosis not present

## 2023-05-19 DIAGNOSIS — K219 Gastro-esophageal reflux disease without esophagitis: Secondary | ICD-10-CM | POA: Diagnosis not present

## 2023-05-19 DIAGNOSIS — E669 Obesity, unspecified: Secondary | ICD-10-CM | POA: Diagnosis not present

## 2023-05-19 DIAGNOSIS — E785 Hyperlipidemia, unspecified: Secondary | ICD-10-CM | POA: Diagnosis not present

## 2023-05-19 DIAGNOSIS — Z87891 Personal history of nicotine dependence: Secondary | ICD-10-CM | POA: Diagnosis not present

## 2023-05-19 DIAGNOSIS — H9193 Unspecified hearing loss, bilateral: Secondary | ICD-10-CM | POA: Diagnosis not present

## 2023-05-19 DIAGNOSIS — G4733 Obstructive sleep apnea (adult) (pediatric): Secondary | ICD-10-CM | POA: Diagnosis not present

## 2023-05-19 DIAGNOSIS — Z7982 Long term (current) use of aspirin: Secondary | ICD-10-CM | POA: Diagnosis not present

## 2023-05-19 DIAGNOSIS — Z86718 Personal history of other venous thrombosis and embolism: Secondary | ICD-10-CM | POA: Diagnosis not present

## 2023-05-31 DIAGNOSIS — G4733 Obstructive sleep apnea (adult) (pediatric): Secondary | ICD-10-CM | POA: Diagnosis not present

## 2023-06-03 DIAGNOSIS — H903 Sensorineural hearing loss, bilateral: Secondary | ICD-10-CM | POA: Diagnosis not present

## 2023-06-04 DIAGNOSIS — H04123 Dry eye syndrome of bilateral lacrimal glands: Secondary | ICD-10-CM | POA: Diagnosis not present

## 2023-06-23 DIAGNOSIS — G4733 Obstructive sleep apnea (adult) (pediatric): Secondary | ICD-10-CM | POA: Diagnosis not present

## 2023-06-23 DIAGNOSIS — K219 Gastro-esophageal reflux disease without esophagitis: Secondary | ICD-10-CM | POA: Diagnosis not present

## 2023-06-23 DIAGNOSIS — R918 Other nonspecific abnormal finding of lung field: Secondary | ICD-10-CM | POA: Diagnosis not present

## 2023-06-23 DIAGNOSIS — R053 Chronic cough: Secondary | ICD-10-CM | POA: Diagnosis not present

## 2023-07-31 DIAGNOSIS — G4733 Obstructive sleep apnea (adult) (pediatric): Secondary | ICD-10-CM | POA: Diagnosis not present

## 2023-08-04 DIAGNOSIS — M545 Low back pain, unspecified: Secondary | ICD-10-CM | POA: Diagnosis not present

## 2023-08-04 DIAGNOSIS — M9904 Segmental and somatic dysfunction of sacral region: Secondary | ICD-10-CM | POA: Diagnosis not present

## 2023-08-04 DIAGNOSIS — M259 Joint disorder, unspecified: Secondary | ICD-10-CM | POA: Diagnosis not present

## 2023-08-07 DIAGNOSIS — Z1231 Encounter for screening mammogram for malignant neoplasm of breast: Secondary | ICD-10-CM | POA: Diagnosis not present

## 2023-08-07 DIAGNOSIS — R92323 Mammographic fibroglandular density, bilateral breasts: Secondary | ICD-10-CM | POA: Diagnosis not present

## 2023-08-13 DIAGNOSIS — M533 Sacrococcygeal disorders, not elsewhere classified: Secondary | ICD-10-CM | POA: Diagnosis not present

## 2023-09-07 NOTE — Progress Notes (Deleted)
 MRN : [id number]  Elizabeth Crawford is a 78 y.o. (10-19-1945) female who presents with chief complaint of legs hurt and swell.  History of Present Illness:   The patient returns to the office for follow up regarding her past DVT.  DVT was identified at [facility] by Duplex ultrasound remotely.  The initial symptoms were pain and swelling in the lower extremity.   At that time she had a filter placed at that time. Several months later attempts at removing the filter were unsuccessful. Since then we have been monitoring the patency of the inferior vena cava.   The patient notes her legs have some pain with dependency and continue to swell as the day goes on.  She is noting an increasing number and size of her varicose veins.  Symptoms are much better with compression and elevation.  The patient notes minimal edema in the morning which steadily increases throughout the day.     The patient has been using compression on a daily at this point.   The patient is status post Nissen fundoplication as well as breast reduction secondary to severe respiratory symptoms.  She did not have any problems with either surgery.  She notes that her symptoms are dramatically improved having completed these 2 procedures.   No SOB or pleuritic chest pains.  No cough or hemoptysis.   No blood per rectum or blood in any sputum.  No excessive bruising per the patient.    Duplex ultrasound of the inferior vena cava done today shows the filter appears to be patent and normal flow is noted throughout the inferior vena cava cava  No outpatient medications have been marked as taking for the 09/08/23 encounter (Appointment) with Prescilla Brod, Ninette Basque, MD.    Past Medical History:  Diagnosis Date   Allergy    Arthritis    Complication of anesthesia    DVT (deep venous thrombosis) (HCC) 2011   has IVC filter   GERD (gastroesophageal reflux disease)    H/O seasonal allergies    History of hiatal hernia     Hypercholesteremia    PONV (postoperative nausea and vomiting)    Gets sick every time   Pulmonary embolism (HCC)    Sleep apnea    Spondylolisthesis     Past Surgical History:  Procedure Laterality Date   BACK SURGERY     x2   BLADDER SUSPENSION  2009   BREAST REDUCTION SURGERY Bilateral 03/01/2019   Procedure: BILATERAL BREAST REDUCTION;  Surgeon: Thornell Flirt, DO;  Location: [facility] OR;  Service: Plastics;  Laterality: Bilateral;  total case 4 hours   CARPAL TUNNEL RELEASE  2003   Left hand   CHOLECYSTECTOMY  2000   Laparoscopic   COLONOSCOPY W/ POLYPECTOMY  2014   COLONOSCOPY WITH PROPOFOL  N/A 05/22/2017   Procedure: COLONOSCOPY WITH PROPOFOL ;  Surgeon: Deveron Fly, MD;  Location: [facility] ENDOSCOPY;  Service: Endoscopy;  Laterality: N/A;   DECOMPRESSIVE LUMBAR LAMINECTOMY LEVEL 2 N/A 11/09/2015   Procedure: DECOMPRESSION L2-5 REMOVAL PEDICULE SCREWS L4-5;  Surgeon: Mort Ards, MD;  Location: [facility] OR;  Service: Orthopedics;  Laterality: N/A;   ESOPHAGOGASTRODUODENOSCOPY N/A 05/21/2022   Procedure: ESOPHAGOGASTRODUODENOSCOPY (EGD);  Surgeon: Luke Salaam, MD;  Location: [facility] ENDOSCOPY;  Service: Gastroenterology;  Laterality: N/A;   ESOPHAGOGASTRODUODENOSCOPY (EGD) WITH PROPOFOL  N/A 10/09/2018   Procedure: ESOPHAGOGASTRODUODENOSCOPY (EGD) WITH PROPOFOL ;  Surgeon: Deveron Fly, MD;  Location: [facility] ENDOSCOPY;  Service: Endoscopy;  Laterality: N/A;  ESOPHAGOGASTRODUODENOSCOPY (EGD) WITH PROPOFOL  N/A 05/16/2020   Procedure: ESOPHAGOGASTRODUODENOSCOPY (EGD) WITH PROPOFOL ;  Surgeon: Luke Salaam, MD;  Location: [facility] ENDOSCOPY;  Service: Gastroenterology;  Laterality: N/A;   EYE SURGERY Bilateral 2005   cataract surgery    HARDWARE REMOVAL N/A 11/09/2015   Procedure: HARDWARE REMOVAL;  Surgeon: Mort Ards, MD;  Location: [facility] OR;  Service: Orthopedics;  Laterality: N/A;   HEMORRHOID SURGERY  1978   HIATAL HERNIA REPAIR     twice   INSERTION OF MESH  07/19/2021    Procedure: INSERTION OF MESH;  Surgeon: Alben Alma, MD;  Location: [facility] ORS;  Service: General;;   IVC filter placement  02/01/2010   REDUCTION MAMMAPLASTY Bilateral 03/01/2019   SPINE SURGERY  5 23-12   anterolateral fusion/ rodding   TONSILLECTOMY     TUBAL LIGATION  1977   VENTRAL HERNIA REPAIR N/A 07/19/2021   Procedure: HERNIA REPAIR VENTRAL ADULT, incisional repair;  Surgeon: Alben Alma, MD;  Location: [facility] ORS;  Service: General;  Laterality: N/A;   XI ROBOTIC ASSISTED HIATAL HERNIA REPAIR N/A 05/23/2020   Procedure: XI ROBOTIC ASSISTED HIATAL HERNIA REPAIR with Adrianne Allred, RNFA to assist;  Surgeon: Alben Alma, MD;  Location: [facility] ORS;  Service: General;  Laterality: N/A;  Provider requesting 3 hours /180 minutes for procedure    Social History Social History   Tobacco Use   Smoking status: Former    Current packs/day: 0.00    Types: Cigarettes    Start date: 02/25/1961    Quit date: 02/26/1976    Years since quitting: 47.5    Passive exposure: Past   Smokeless tobacco: Never  Vaping Use   Vaping status: Never Used  Substance Use Topics   Alcohol use: Yes    Comment: rarely   Drug use: No    Family History Family History  Problem Relation Age of Onset   COPD Mother    Heart disease Mother    Aortic aneurysm Mother    Heart disease Father     Allergies  Allergen Reactions   Sulfa Antibiotics Other (See Comments) and Nausea And Vomiting    UNSPECIFIED   Tape     Paper and adhesive tape make blisters and redness and difficult to remove even on the first day   Elemental Sulfur  Nausea And Vomiting   Methylprednisolone Palpitations   Sulfasalazine Nausea Only     REVIEW OF SYSTEMS (Negative unless checked)  Constitutional: [] Weight loss  [] Fever  [] Chills Cardiac: [] Chest pain   [] Chest pressure   [] Palpitations   [] Shortness of breath when laying flat   [] Shortness of breath with exertion. Vascular:  [] Pain in legs with walking   [x] Pain in  legs at rest  [] History of DVT   [] Phlebitis   [x] Swelling in legs   [] Varicose veins   [] Non-healing ulcers Pulmonary:   [] Uses home oxygen   [] Productive cough   [] Hemoptysis   [] Wheeze  [] COPD   [] Asthma Neurologic:  [] Dizziness   [] Seizures   [] History of stroke   [] History of TIA  [] Aphasia   [] Vissual changes   [] Weakness or numbness in arm   [] Weakness or numbness in leg Musculoskeletal:   [] Joint swelling   [] Joint pain   [] Low back pain Hematologic:  [] Easy bruising  [] Easy bleeding   [] Hypercoagulable state   [] Anemic Gastrointestinal:  [] Diarrhea   [] Vomiting  [x] Gastroesophageal reflux/heartburn   [] Difficulty swallowing. Genitourinary:  [] Chronic kidney disease   [] Difficult urination  [] Frequent urination   [] Blood  in urine Skin:  [] Rashes   [] Ulcers  Psychological:  [] History of anxiety   []  History of major depression.  Physical Examination  There were no vitals filed for this visit. There is no height or weight on file to calculate BMI. Gen: WD/WN, NAD Head: [place]/AT, No temporalis wasting.  Ear/Nose/Throat: Hearing grossly intact, nares w/o erythema or drainage, pinna without lesions Eyes: PER, EOMI, sclera nonicteric.  Neck: Supple, no gross masses.  No JVD.  Pulmonary:  Good air movement, no audible wheezing, no use of accessory muscles.  Cardiac: RRR, precordium not hyperdynamic. Vascular:  scattered varicosities present bilaterally.  Moderate venous stasis changes to the legs bilaterally.  2+ soft pitting edema. CEAP [id number]   Vessel Right Left  Radial Palpable Palpable  Gastrointestinal: soft, non-distended. No guarding/no peritoneal signs.  Musculoskeletal: M/S 5/5 throughout.  No deformity.  Neurologic: CN 2-12 intact. Pain and light touch intact in extremities.  Symmetrical.  Speech is fluent. Motor exam as listed above. Psychiatric: Judgment intact, Mood & affect appropriate for pt's clinical situation. Dermatologic: Venous rashes no ulcers noted.  No changes  consistent with cellulitis. Lymph : No lichenification or skin changes of chronic lymphedema.  CBC Lab Results  Component Value Date   WBC 5.9 07/12/2021   HGB 12.1 07/12/2021   HCT 37.6 07/12/2021   MCV 94.9 07/12/2021   PLT 129 (L) 07/12/2021    BMET    Component Value Date/Time   NA 138 07/12/2021 1150   K 3.6 07/12/2021 1150   CL 106 07/12/2021 1150   CO2 26 07/12/2021 1150   GLUCOSE 78 07/12/2021 1150   BUN 13 07/12/2021 1150   CREATININE 0.82 07/12/2021 1150   CALCIUM 9.2 07/12/2021 1150   GFRNONAA >60 07/12/2021 1150   GFRAA >60 01/28/2019 1030   CrCl cannot be calculated (Patient's most recent lab result is older than the maximum 21 days allowed.).  COAG Lab Results  Component Value Date   INR 1.17 08/18/2010   INR 1.09 08/17/2010   INR 1.07 08/15/2010    Radiology No results found.   Assessment/Plan There are no diagnoses linked to this encounter.   Devon Fogo, MD  09/07/2023 3:01 PM

## 2023-09-08 ENCOUNTER — Ambulatory Visit (INDEPENDENT_AMBULATORY_CARE_PROVIDER_SITE_OTHER): Payer: Medicare HMO | Admitting: Vascular Surgery

## 2023-09-08 ENCOUNTER — Encounter (INDEPENDENT_AMBULATORY_CARE_PROVIDER_SITE_OTHER): Payer: Medicare HMO

## 2023-09-08 DIAGNOSIS — G4733 Obstructive sleep apnea (adult) (pediatric): Secondary | ICD-10-CM | POA: Diagnosis not present

## 2023-09-08 DIAGNOSIS — I872 Venous insufficiency (chronic) (peripheral): Secondary | ICD-10-CM

## 2023-09-08 DIAGNOSIS — E7849 Other hyperlipidemia: Secondary | ICD-10-CM

## 2023-09-08 DIAGNOSIS — Z86711 Personal history of pulmonary embolism: Secondary | ICD-10-CM

## 2023-09-08 DIAGNOSIS — Z86718 Personal history of other venous thrombosis and embolism: Secondary | ICD-10-CM

## 2023-09-08 DIAGNOSIS — K219 Gastro-esophageal reflux disease without esophagitis: Secondary | ICD-10-CM

## 2023-10-22 DIAGNOSIS — M545 Low back pain, unspecified: Secondary | ICD-10-CM | POA: Diagnosis not present

## 2023-10-22 DIAGNOSIS — M259 Joint disorder, unspecified: Secondary | ICD-10-CM | POA: Diagnosis not present

## 2023-11-17 DIAGNOSIS — M545 Low back pain, unspecified: Secondary | ICD-10-CM | POA: Diagnosis not present

## 2023-11-26 DIAGNOSIS — M545 Low back pain, unspecified: Secondary | ICD-10-CM | POA: Diagnosis not present

## 2023-12-01 DIAGNOSIS — M545 Low back pain, unspecified: Secondary | ICD-10-CM | POA: Diagnosis not present

## 2023-12-03 DIAGNOSIS — M545 Low back pain, unspecified: Secondary | ICD-10-CM | POA: Diagnosis not present

## 2023-12-07 DIAGNOSIS — G4733 Obstructive sleep apnea (adult) (pediatric): Secondary | ICD-10-CM | POA: Diagnosis not present

## 2023-12-11 DIAGNOSIS — L821 Other seborrheic keratosis: Secondary | ICD-10-CM | POA: Diagnosis not present

## 2023-12-11 DIAGNOSIS — D225 Melanocytic nevi of trunk: Secondary | ICD-10-CM | POA: Diagnosis not present

## 2023-12-11 DIAGNOSIS — D2271 Melanocytic nevi of right lower limb, including hip: Secondary | ICD-10-CM | POA: Diagnosis not present

## 2023-12-11 DIAGNOSIS — D2272 Melanocytic nevi of left lower limb, including hip: Secondary | ICD-10-CM | POA: Diagnosis not present

## 2023-12-11 DIAGNOSIS — D2262 Melanocytic nevi of left upper limb, including shoulder: Secondary | ICD-10-CM | POA: Diagnosis not present

## 2023-12-11 DIAGNOSIS — L720 Epidermal cyst: Secondary | ICD-10-CM | POA: Diagnosis not present

## 2023-12-11 DIAGNOSIS — D2261 Melanocytic nevi of right upper limb, including shoulder: Secondary | ICD-10-CM | POA: Diagnosis not present

## 2023-12-17 DIAGNOSIS — M545 Low back pain, unspecified: Secondary | ICD-10-CM | POA: Diagnosis not present

## 2023-12-19 DIAGNOSIS — M545 Low back pain, unspecified: Secondary | ICD-10-CM | POA: Diagnosis not present

## 2023-12-30 DIAGNOSIS — M545 Low back pain, unspecified: Secondary | ICD-10-CM | POA: Diagnosis not present

## 2024-01-01 DIAGNOSIS — M545 Low back pain, unspecified: Secondary | ICD-10-CM | POA: Diagnosis not present

## 2024-01-06 DIAGNOSIS — M545 Low back pain, unspecified: Secondary | ICD-10-CM | POA: Diagnosis not present

## 2024-01-07 DIAGNOSIS — R413 Other amnesia: Secondary | ICD-10-CM | POA: Diagnosis not present

## 2024-01-07 DIAGNOSIS — E559 Vitamin D deficiency, unspecified: Secondary | ICD-10-CM | POA: Diagnosis not present

## 2024-01-07 DIAGNOSIS — E569 Vitamin deficiency, unspecified: Secondary | ICD-10-CM | POA: Diagnosis not present

## 2024-01-08 DIAGNOSIS — M545 Low back pain, unspecified: Secondary | ICD-10-CM | POA: Diagnosis not present

## 2024-01-16 DIAGNOSIS — R413 Other amnesia: Secondary | ICD-10-CM | POA: Diagnosis not present

## 2024-02-26 ENCOUNTER — Encounter: Payer: Self-pay | Admitting: Surgery
